# Patient Record
Sex: Female | Born: 1980 | Race: White | Hispanic: No | Marital: Married | Smoking: Former smoker
Health system: Southern US, Community
[De-identification: ages and names within clinical notes are randomized; demographics above are authoritative.]

## PROBLEM LIST (undated history)

## (undated) ENCOUNTER — Inpatient Hospital Stay (HOSPITAL_COMMUNITY): Payer: Self-pay

## (undated) ENCOUNTER — Emergency Department (HOSPITAL_COMMUNITY): Admission: EM | Payer: 59 | Source: Home / Self Care

## (undated) DIAGNOSIS — F1921 Other psychoactive substance dependence, in remission: Secondary | ICD-10-CM

## (undated) DIAGNOSIS — I10 Essential (primary) hypertension: Secondary | ICD-10-CM

## (undated) DIAGNOSIS — J9819 Other pulmonary collapse: Secondary | ICD-10-CM

## (undated) DIAGNOSIS — B1920 Unspecified viral hepatitis C without hepatic coma: Secondary | ICD-10-CM

## (undated) DIAGNOSIS — J189 Pneumonia, unspecified organism: Secondary | ICD-10-CM

## (undated) HISTORY — PX: ASPIRATION BIOPSY: SHX1197

## (undated) HISTORY — PX: WISDOM TOOTH EXTRACTION: SHX21

## (undated) HISTORY — DX: Essential (primary) hypertension: I10

---
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
## 2002-04-02 DIAGNOSIS — J9819 Other pulmonary collapse: Secondary | ICD-10-CM

## 2002-04-02 HISTORY — DX: Other pulmonary collapse: J98.19

## 2002-08-20 ENCOUNTER — Encounter: Payer: Self-pay | Admitting: Emergency Medicine

## 2002-08-20 ENCOUNTER — Emergency Department (HOSPITAL_COMMUNITY): Admission: EM | Admit: 2002-08-20 | Discharge: 2002-08-20 | Payer: Self-pay | Admitting: Emergency Medicine

## 2002-08-27 ENCOUNTER — Encounter: Payer: Self-pay | Admitting: Emergency Medicine

## 2002-08-27 ENCOUNTER — Inpatient Hospital Stay (HOSPITAL_COMMUNITY): Admission: EM | Admit: 2002-08-27 | Discharge: 2002-09-01 | Payer: Self-pay | Admitting: Emergency Medicine

## 2002-08-28 ENCOUNTER — Encounter: Payer: Self-pay | Admitting: Family Medicine

## 2002-09-01 ENCOUNTER — Encounter: Payer: Self-pay | Admitting: Family Medicine

## 2003-08-16 ENCOUNTER — Emergency Department (HOSPITAL_COMMUNITY): Admission: EM | Admit: 2003-08-16 | Discharge: 2003-08-16 | Payer: Self-pay | Admitting: Emergency Medicine

## 2004-03-27 ENCOUNTER — Emergency Department (HOSPITAL_COMMUNITY): Admission: EM | Admit: 2004-03-27 | Discharge: 2004-03-27 | Payer: Self-pay | Admitting: Emergency Medicine

## 2004-12-04 ENCOUNTER — Emergency Department (HOSPITAL_COMMUNITY): Admission: EM | Admit: 2004-12-04 | Discharge: 2004-12-04 | Payer: Self-pay | Admitting: Emergency Medicine

## 2005-01-31 ENCOUNTER — Ambulatory Visit (HOSPITAL_COMMUNITY): Admission: RE | Admit: 2005-01-31 | Discharge: 2005-01-31 | Payer: Self-pay | Admitting: *Deleted

## 2005-03-14 ENCOUNTER — Ambulatory Visit (HOSPITAL_COMMUNITY): Admission: RE | Admit: 2005-03-14 | Discharge: 2005-03-14 | Payer: Self-pay | Admitting: *Deleted

## 2005-04-07 ENCOUNTER — Inpatient Hospital Stay (HOSPITAL_COMMUNITY): Admission: AD | Admit: 2005-04-07 | Discharge: 2005-04-07 | Payer: Self-pay | Admitting: *Deleted

## 2005-05-25 ENCOUNTER — Inpatient Hospital Stay (HOSPITAL_COMMUNITY): Admission: AD | Admit: 2005-05-25 | Discharge: 2005-05-25 | Payer: Self-pay | Admitting: Obstetrics & Gynecology

## 2005-07-08 ENCOUNTER — Inpatient Hospital Stay (HOSPITAL_COMMUNITY): Admission: AD | Admit: 2005-07-08 | Discharge: 2005-07-08 | Payer: Self-pay | Admitting: Obstetrics

## 2005-08-07 ENCOUNTER — Inpatient Hospital Stay (HOSPITAL_COMMUNITY): Admission: AD | Admit: 2005-08-07 | Discharge: 2005-08-10 | Payer: Self-pay | Admitting: Obstetrics

## 2005-08-08 ENCOUNTER — Encounter (INDEPENDENT_AMBULATORY_CARE_PROVIDER_SITE_OTHER): Payer: Self-pay | Admitting: Specialist

## 2006-10-07 ENCOUNTER — Emergency Department (HOSPITAL_COMMUNITY): Admission: EM | Admit: 2006-10-07 | Discharge: 2006-10-07 | Payer: Self-pay | Admitting: Family Medicine

## 2007-01-30 ENCOUNTER — Emergency Department (HOSPITAL_COMMUNITY): Admission: EM | Admit: 2007-01-30 | Discharge: 2007-01-30 | Payer: Self-pay | Admitting: Family Medicine

## 2007-04-03 HISTORY — PX: LIVER BIOPSY: SHX301

## 2009-01-12 ENCOUNTER — Ambulatory Visit (HOSPITAL_COMMUNITY): Payer: Self-pay | Admitting: Psychiatry

## 2009-01-21 ENCOUNTER — Ambulatory Visit (HOSPITAL_COMMUNITY): Payer: Self-pay | Admitting: Psychology

## 2009-02-18 ENCOUNTER — Ambulatory Visit (HOSPITAL_COMMUNITY): Payer: Self-pay | Admitting: Psychology

## 2010-08-18 NOTE — Discharge Summary (Signed)
Krystal Zamora, Krystal Zamora                            ACCOUNT NO.:  [id number]   MEDICAL RECORD NO.:  [id number]                   PATIENT TYPE:  INP   LOCATION:  0455                                 FACILITY:  [facility]   PHYSICIAN:  Betti D. Pecola Leisure, M.D.                DATE OF BIRTH:  [birth year]   DATE OF ADMISSION:  08/27/2002  DATE OF DISCHARGE:  09/01/2002                                 DISCHARGE SUMMARY   CHIEF COMPLAINT:  Chest pain and cough.   HISTORY OF PRESENT ILLNESS:  The patient is a 30 year old Caucasian female  who is new to me.  She has a one day history of post-tussive chest pain,  shortness of breath and reportedly a history of hematemesis.  She is a  patient who was admitted in March and discharged in May for a left lung  empyema which was drained by chest tube placement.  Prior to her discharge  from the hospital, the chest tube was removed and the patient was felt to be  improving.  However, over the past 1-2 weeks she had begun coughing a mucus-  like, blood tinged sputum but thought that it was related to her allergies.  She had denied any nausea or fever.  The patient does have a history of drug  abuse and alcohol use.   LABORATORY DATA:  On admission, white count 15.7, hemoglobin 11, hematocrit  32.1, platelet count 428, neutrophil count 84% which was elevated.  Sodium  141, potassium 3.5, glucose 106, creatinine 0.4, BUN 10.  Calcium 9.5 with  albumin 3.2 which was low.   Chest x-ray revealed a left lower lobe pneumonia with some indication of a  consolidation in the right upper lobe.   HOSPITAL COURSE:  The patient was admitted for treatment of her right lower  lobe pneumonia and dually for observation to see if there would be any  further empyema formation considering her history.   She was placed on Zithromax 500 mg IV daily to manage her left lower lobe  pneumonia and Phenergan 25 mg IV q.4 h. to prevent nausea that she would  experience along with Ativan  0.5 mg one tablet q.8 h. as needed.   The patient did make progress in her hospitalization in that her discomfort  eased.  Repeat x-ray showed continued left lower lobe pneumonia but she had  not worsened.   On hospital day #5, she was discharged home with further treatment with  Cipro 500 mg one tablet for another ten days.  She was given Tylox 1-2  tablets q.4-6 h. for her pain and maintained on her Celexa 10 mg daily for  her depression.  In addition, she was given Augmentin XR 1000 mg one tablet  b.i.d. for a total of ten days therapy along with her Cipro.  She is to  follow with myself or her physician at [facility] in  the next 5-10 days.                                               Betti D. Pecola Leisure, M.D.    BDR/MEDQ  D:  09/09/2002  T:  09/09/2002  Job:  [id number]

## 2010-12-13 ENCOUNTER — Emergency Department (HOSPITAL_COMMUNITY): Payer: Self-pay

## 2010-12-13 ENCOUNTER — Emergency Department (HOSPITAL_COMMUNITY)
Admission: EM | Admit: 2010-12-13 | Discharge: 2010-12-13 | Disposition: A | Discharge status: Home / Self Care | Payer: Self-pay | Attending: Emergency Medicine | Admitting: Emergency Medicine

## 2010-12-13 DIAGNOSIS — R000 Tachycardia, unspecified: Secondary | ICD-10-CM | POA: Insufficient documentation

## 2010-12-13 DIAGNOSIS — J40 Bronchitis, not specified as acute or chronic: Secondary | ICD-10-CM | POA: Insufficient documentation

## 2010-12-13 DIAGNOSIS — J3489 Other specified disorders of nose and nasal sinuses: Secondary | ICD-10-CM | POA: Insufficient documentation

## 2010-12-13 DIAGNOSIS — R05 Cough: Secondary | ICD-10-CM | POA: Insufficient documentation

## 2010-12-13 DIAGNOSIS — IMO0001 Reserved for inherently not codable concepts without codable children: Secondary | ICD-10-CM | POA: Insufficient documentation

## 2010-12-13 DIAGNOSIS — R059 Cough, unspecified: Secondary | ICD-10-CM | POA: Insufficient documentation

## 2010-12-13 DIAGNOSIS — R079 Chest pain, unspecified: Secondary | ICD-10-CM | POA: Insufficient documentation

## 2010-12-13 DIAGNOSIS — R062 Wheezing: Secondary | ICD-10-CM | POA: Insufficient documentation

## 2010-12-13 LAB — POCT I-STAT, CHEM 8
BUN: 7 mg/dL (ref 6–23)
Calcium, Ion: 1.3 mmol/L (ref 1.12–1.32)
Chloride: 106 mEq/L (ref 96–112)
Creatinine, Ser: 0.8 mg/dL (ref 0.50–1.10)
Glucose, Bld: 75 mg/dL (ref 70–99)
HCT: 36 % (ref 36.0–46.0)
Hemoglobin: 12.2 g/dL (ref 12.0–15.0)
Potassium: 3.8 mEq/L (ref 3.5–5.1)
Sodium: 141 mEq/L (ref 135–145)
TCO2: 24 mmol/L (ref 0–100)

## 2010-12-13 LAB — D-DIMER, QUANTITATIVE: D-Dimer, Quant: 0.69 ug/mL-FEU — ABNORMAL HIGH (ref 0.00–0.48)

## 2010-12-13 MED ORDER — IOHEXOL 300 MG/ML  SOLN
100.0000 mL | Freq: Once | INTRAMUSCULAR | Status: AC | PRN
  Administered 2010-12-13: 100 mL via INTRAVENOUS

## 2011-01-10 LAB — POCT PREGNANCY, URINE
Operator id: 200941
Preg Test, Ur: NEGATIVE

## 2011-01-10 LAB — POCT URINALYSIS DIP (DEVICE)
Operator id: 200941
Specific Gravity, Urine: 1.015
Urobilinogen, UA: 0.2
pH: 6

## 2011-04-27 ENCOUNTER — Ambulatory Visit: Payer: Self-pay | Admitting: Family Medicine

## 2011-05-16 ENCOUNTER — Ambulatory Visit (INDEPENDENT_AMBULATORY_CARE_PROVIDER_SITE_OTHER): Payer: Self-pay | Admitting: Family Medicine

## 2011-05-16 VITALS — BP 124/90 | HR 110 | Temp 98.6°F | Ht 67.0 in | Wt 164.5 lb

## 2011-05-16 DIAGNOSIS — B1920 Unspecified viral hepatitis C without hepatic coma: Secondary | ICD-10-CM

## 2011-05-16 DIAGNOSIS — R454 Irritability and anger: Secondary | ICD-10-CM

## 2011-05-16 NOTE — Progress Notes (Signed)
  Subjective:    Patient ID: Krystal Zamora, female    DOB: 05-Oct-1980, 31 y.o.   MRN: [id number]  HPI Pt that comes today to establish care. 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 She also has diagnosis of Hepatitis C since that time. Her liver enzymes were within normal limits but has not had medical f/u in years. Denies symptoms of fatigue, nausea, anorexia or myalgias.  Her other complaint is irritability. She was taking SSRI in the past but does not remember the name and did not take it for a long period of time. She does not have anhedonia, or decrease level of energy, no suicidal/homicydal thoughts or plans. 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  No other complaints.  Review of Systems  Constitutional: Negative for fever, chills, activity change, appetite change and fatigue.  HENT: Negative.   Respiratory: Negative.   Cardiovascular: Negative for chest pain and palpitations.  Gastrointestinal: Negative for nausea, vomiting, abdominal pain, diarrhea, constipation and abdominal distention.  Genitourinary: Negative.   Musculoskeletal: Negative.   Neurological: Negative.  Negative for dizziness and weakness.  Psychiatric/Behavioral: Negative for suicidal ideas, hallucinations, behavioral problems, confusion, self-injury, dysphoric mood, decreased concentration and agitation. The patient is not nervous/anxious.        Objective:   Physical Exam  Constitutional: She is oriented to person, place, and time. She appears well-developed and well-nourished. No distress.  HENT:  Head: Normocephalic and atraumatic.  Mouth/Throat: No oropharyngeal exudate.  Eyes: Conjunctivae and EOM are normal. Pupils are equal, round, and reactive to light.  Cardiovascular: Normal rate, regular rhythm, normal heart sounds and intact distal pulses.   Pulmonary/Chest: Breath sounds normal. No respiratory distress.  Abdominal: Bowel  sounds are normal. She exhibits no distension and no mass. There is no tenderness. There is no rebound and no guarding.  Musculoskeletal: She exhibits no edema.  Neurological: She is alert and oriented to person, place, and time. She has normal reflexes.  Psychiatric: She has a normal mood and affect. Her behavior is normal. Judgment and thought content normal.          Assessment & Plan:

## 2011-05-16 NOTE — Patient Instructions (Addendum)
It has been a pleasure to meet you today. I will call you with the labs results if they come back abnormal otherwise you will receive a letter. Make an appointment for regular annual exam.

## 2011-05-21 DIAGNOSIS — R454 Irritability and anger: Secondary | ICD-10-CM | POA: Insufficient documentation

## 2011-05-21 NOTE — Assessment & Plan Note (Signed)
Diagnosed in 2004. Primary source IV drug abuse. Plan: CMET and HCV RNA. Also will check for HIV and Hep B since way of transmission is similar. Evaluate with results if pt will benefit from antiviral therapy.

## 2011-05-21 NOTE — Assessment & Plan Note (Signed)
PHQ-9 scored 3. No depression, mood disorder questionnaire also negative scrren for mood disturbances. Most likely due to emotional and social stress. This is not interfering with her normal daily functioning.  Plan: No pharmacologic intervention needed at this time. Will re-evaluate on next visit.

## 2011-07-18 ENCOUNTER — Other Ambulatory Visit: Payer: Self-pay

## 2011-07-18 ENCOUNTER — Ambulatory Visit (INDEPENDENT_AMBULATORY_CARE_PROVIDER_SITE_OTHER): Payer: Self-pay | Admitting: Family Medicine

## 2011-07-18 ENCOUNTER — Other Ambulatory Visit (HOSPITAL_COMMUNITY)
Admission: RE | Admit: 2011-07-18 | Discharge: 2011-07-18 | Disposition: A | Discharge status: Home / Self Care | Payer: Self-pay | Source: Ambulatory Visit | Attending: Family Medicine | Admitting: Family Medicine

## 2011-07-18 ENCOUNTER — Encounter: Payer: Self-pay | Admitting: Family Medicine

## 2011-07-18 VITALS — BP 123/87 | HR 71 | Temp 98.2°F | Ht 67.0 in | Wt 165.0 lb

## 2011-07-18 DIAGNOSIS — Z209 Contact with and (suspected) exposure to unspecified communicable disease: Secondary | ICD-10-CM

## 2011-07-18 DIAGNOSIS — Z113 Encounter for screening for infections with a predominantly sexual mode of transmission: Secondary | ICD-10-CM | POA: Insufficient documentation

## 2011-07-18 DIAGNOSIS — Z01419 Encounter for gynecological examination (general) (routine) without abnormal findings: Secondary | ICD-10-CM | POA: Insufficient documentation

## 2011-07-18 DIAGNOSIS — Z23 Encounter for immunization: Secondary | ICD-10-CM

## 2011-07-18 DIAGNOSIS — N760 Acute vaginitis: Secondary | ICD-10-CM

## 2011-07-18 DIAGNOSIS — B1920 Unspecified viral hepatitis C without hepatic coma: Secondary | ICD-10-CM

## 2011-07-18 DIAGNOSIS — Z124 Encounter for screening for malignant neoplasm of cervix: Secondary | ICD-10-CM

## 2011-07-18 LAB — POCT WET PREP (WET MOUNT)

## 2011-07-18 NOTE — Patient Instructions (Signed)
I will call you with results if anything is positive. You will get a letter if all normal. Make sure to schedule yearly physical exam.   Health Maintenance, Females A healthy lifestyle and preventative care can promote health and wellness.  Maintain regular health, dental, and eye exams.   Eat a healthy diet. Foods like vegetables, fruits, whole grains, low-fat dairy products, and lean protein foods contain the nutrients you need without too many calories. Decrease your intake of foods high in solid fats, added sugars, and salt. Get information about a proper diet from your caregiver, if necessary.   Regular physical exercise is one of the most important things you can do for your health. Most adults should get at least 150 minutes of moderate-intensity exercise (any activity that increases your heart rate and causes you to sweat) each week. In addition, most adults need muscle-strengthening exercises on 2 or more days a week.    Maintain a healthy weight. The body mass index (BMI) is a screening tool to identify possible weight problems. It provides an estimate of body fat based on height and weight. Your caregiver can help determine your BMI, and can help you achieve or maintain a healthy weight. For adults 20 years and older:   A BMI below 18.5 is considered underweight.   A BMI of 18.5 to 24.9 is normal.   A BMI of 25 to 29.9 is considered overweight.   A BMI of 30 and above is considered obese.   Maintain normal blood lipids and cholesterol by exercising and minimizing your intake of saturated fat. Eat a balanced diet with plenty of fruits and vegetables. Blood tests for lipids and cholesterol should begin at age 56 and be repeated every 5 years. If your lipid or cholesterol levels are high, you are over 50, or you are a high risk for heart disease, you may need your cholesterol levels checked more frequently.Ongoing high lipid and cholesterol levels should be treated with medicines if  diet and exercise are not effective.   If you smoke, find out from your caregiver how to quit. If you do not use tobacco, do not start.   If you are pregnant, do not drink alcohol. If you are breastfeeding, be very cautious about drinking alcohol. If you are not pregnant and choose to drink alcohol, do not exceed 1 drink per day. One drink is considered to be 12 ounces (355 mL) of beer, 5 ounces (148 mL) of wine, or 1.5 ounces (44 mL) of liquor.   Avoid use of street drugs. Do not share needles with anyone. Ask for help if you need support or instructions about stopping the use of drugs.   High blood pressure causes heart disease and increases the risk of stroke. Blood pressure should be checked at least every 1 to 2 years. Ongoing high blood pressure should be treated with medicines, if weight loss and exercise are not effective.   If you are 83 to 33 years old, ask your caregiver if you should take aspirin to prevent strokes.   Diabetes screening involves taking a blood sample to check your fasting blood sugar level. This should be done once every 3 years, after age 55, if you are within normal weight and without risk factors for diabetes. Testing should be considered at a younger age or be carried out more frequently if you are overweight and have at least 1 risk factor for diabetes.   Breast cancer screening is essential preventative care for women.  You should practice "breast self-awareness." This means understanding the normal appearance and feel of your breasts and may include breast self-examination. Any changes detected, no matter how small, should be reported to a caregiver. Women in their 44s and 30s should have a clinical breast exam (CBE) by a caregiver as part of a regular health exam every 1 to 3 years. After age 12, women should have a CBE every year. Starting at age 41, women should consider having a mammogram (breast X-ray) every year. Women who have a family history of breast cancer  should talk to their caregiver about genetic screening. Women at a high risk of breast cancer should talk to their caregiver about having an MRI and a mammogram every year.   The Pap test is a screening test for cervical cancer. Women should have a Pap test starting at age 24. Between ages 8 and 52, Pap tests should be repeated every 2 years. Beginning at age 55, you should have a Pap test every 3 years as long as the past 3 Pap tests have been normal. If you had a hysterectomy for a problem that was not cancer or a condition that could lead to cancer, then you no longer need Pap tests. If you are between ages 25 and 68, and you have had normal Pap tests going back 10 years, you no longer need Pap tests. If you have had past treatment for cervical cancer or a condition that could lead to cancer, you need Pap tests and screening for cancer for at least 20 years after your treatment. If Pap tests have been discontinued, risk factors (such as a new sexual partner) need to be reassessed to determine if screening should be resumed. Some women have medical problems that increase the chance of getting cervical cancer. In these cases, your caregiver may recommend more frequent screening and Pap tests.   The human papillomavirus (HPV) test is an additional test that may be used for cervical cancer screening. The HPV test looks for the virus that can cause the cell changes on the cervix. The cells collected during the Pap test can be tested for HPV. The HPV test could be used to screen women aged 34 years and older, and should be used in women of any age who have unclear Pap test results. After the age of 74, women should have HPV testing at the same frequency as a Pap test.   Colorectal cancer can be detected and often prevented. Most routine colorectal cancer screening begins at the age of 63 and continues through age 54. However, your caregiver may recommend screening at an earlier age if you have risk factors for  colon cancer. On a yearly basis, your caregiver may provide home test kits to check for hidden blood in the stool. Use of a small camera at the end of a tube, to directly examine the colon (sigmoidoscopy or colonoscopy), can detect the earliest forms of colorectal cancer. Talk to your caregiver about this at age 94, when routine screening begins. Direct examination of the colon should be repeated every 5 to 10 years through age 35, unless early forms of pre-cancerous polyps or small growths are found.   Hepatitis C blood testing is recommended for all people born from [birth year] through 1967 and any individual with known risks for hepatitis C.   Practice safe sex. Use condoms and avoid high-risk sexual practices to reduce the spread of sexually transmitted infections (STIs). Sexually active women aged 81 and younger should  be checked for Chlamydia, which is a common sexually transmitted infection. Older women with new or multiple partners should also be tested for Chlamydia. Testing for other STIs is recommended if you are sexually active and at increased risk.   Osteoporosis is a disease in which the bones lose minerals and strength with aging. This can result in serious bone fractures. The risk of osteoporosis can be identified using a bone density scan. Women ages 29 and over and women at risk for fractures or osteoporosis should discuss screening with their caregivers. Ask your caregiver whether you should be taking a calcium supplement or vitamin D to reduce the rate of osteoporosis.   Menopause can be associated with physical symptoms and risks. Hormone replacement therapy is available to decrease symptoms and risks. You should talk to your caregiver about whether hormone replacement therapy is right for you.   Use sunscreen with a sun protection factor (SPF) of 30 or greater. Apply sunscreen liberally and repeatedly throughout the day. You should seek shade when your shadow is shorter than you. Protect  yourself by wearing long sleeves, pants, a wide-brimmed hat, and sunglasses year round, whenever you are outdoors.   Notify your caregiver of new moles or changes in moles, especially if there is a change in shape or color. Also notify your caregiver if a mole is larger than the size of a pencil eraser.   Stay current with your immunizations.  Document Released: 10/02/2010 Document Revised: 03/08/2011 Document Reviewed: 10/02/2010 [facility] Patient Information 2012 [place], [place].

## 2011-07-18 NOTE — Assessment & Plan Note (Signed)
Check HIV, RPR, wet prep, GC/CH. Pap performed given history of abnormal result.

## 2011-07-18 NOTE — Progress Notes (Signed)
Labs done today Krystal Zamora 

## 2011-07-18 NOTE — Assessment & Plan Note (Signed)
Will check viral load today. Patient may require new hepatology/GI referral pending results.

## 2011-07-18 NOTE — Progress Notes (Signed)
  Subjective:    Patient ID: Krystal Zamora, female    DOB: 28-Dec-1980, 31 y.o.   MRN: [id number]  HPI  1. STD check. Patient recently discovered her boyfriend of 2 years had infidelity, exposed to possible pathogens through unprotected intercourse. Denies any discharge, pelvic pain, irregular bleeding, lesions, fevers. She uses nuvaring for contraception. Has a history of one abnormal pap few years ago and had one normal test after this.   2. Hx of hepatitis C. Contracted virus in 2004 through history of remote IV drug use. She states that she was treated with one month of interferon therapy several years later given by a GI specialist in [place]-cannot recall name currently.   Review of Systems SEe HPI otherwise negative.     Objective:   Physical Exam  Vitals reviewed. Constitutional: She is oriented to person, place, and time. She appears well-developed and well-nourished. No distress.  HENT:  Head: Normocephalic and atraumatic.  Mouth/Throat: Oropharynx is clear and moist.  Pulmonary/Chest: Effort normal.  Abdominal: Soft. She exhibits no distension. There is no tenderness.  Genitourinary: Vagina normal and uterus normal. No vaginal discharge found.       No lesions. No cervical motion tenderness. Uterine fundus not enlarged.  Musculoskeletal: She exhibits no edema.  Neurological: She is alert and oriented to person, place, and time.  Skin: No rash noted.  Psychiatric: She has a normal mood and affect.       Assessment & Plan:

## 2011-07-19 LAB — COMPREHENSIVE METABOLIC PANEL
ALT: 12 U/L (ref 0–35)
AST: 19 U/L (ref 0–37)
Albumin: 3.5 g/dL (ref 3.5–5.2)
Alkaline Phosphatase: 40 U/L (ref 39–117)
BUN: 12 mg/dL (ref 6–23)
CO2: 25 mEq/L (ref 19–32)
Calcium: 9.1 mg/dL (ref 8.4–10.5)
Chloride: 106 mEq/L (ref 96–112)
Creat: 0.72 mg/dL (ref 0.50–1.10)
Glucose, Bld: 100 mg/dL — ABNORMAL HIGH (ref 70–99)
Potassium: 3.7 mEq/L (ref 3.5–5.3)
Sodium: 138 mEq/L (ref 135–145)
Total Bilirubin: 0.4 mg/dL (ref 0.3–1.2)
Total Protein: 6.4 g/dL (ref 6.0–8.3)

## 2011-07-19 LAB — HEPATITIS B SURFACE ANTIGEN: Hepatitis B Surface Ag: NEGATIVE

## 2011-07-19 LAB — HIV ANTIBODY (ROUTINE TESTING W REFLEX): HIV: NONREACTIVE

## 2011-07-19 LAB — RPR

## 2011-07-20 LAB — HEPATITIS C RNA QUANTITATIVE
HCV Quantitative Log: 5.14 {Log} — ABNORMAL HIGH (ref <1.63)
HCV Quantitative: 138317 IU/mL — ABNORMAL HIGH (ref <43)

## 2011-07-23 ENCOUNTER — Telehealth: Payer: Self-pay | Admitting: Family Medicine

## 2011-07-23 DIAGNOSIS — B1920 Unspecified viral hepatitis C without hepatic coma: Secondary | ICD-10-CM

## 2011-07-23 NOTE — Telephone Encounter (Signed)
Called patient to discuss results. No STDs detected, but her HCV viral load is elevated now almost 10 years after initial infection. She has known HCV since 2004 and underwent one month of interferon under care of a GI specialist in [place].She is unsure of what her viral load was prior to treatment.  She currently has [employer] insurance and will need a GI referral for care as she has not been seen by her doctor in years.

## 2011-07-23 NOTE — Telephone Encounter (Signed)
Lupita Leash is working on the referral

## 2011-11-07 ENCOUNTER — Encounter: Payer: Self-pay | Admitting: Family Medicine

## 2011-11-07 ENCOUNTER — Ambulatory Visit (INDEPENDENT_AMBULATORY_CARE_PROVIDER_SITE_OTHER): Payer: Self-pay | Admitting: Family Medicine

## 2011-11-07 ENCOUNTER — Ambulatory Visit: Payer: Self-pay | Admitting: Family Medicine

## 2011-11-07 VITALS — BP 118/79 | HR 89 | Temp 98.3°F | Ht 67.0 in | Wt 165.0 lb

## 2011-11-07 DIAGNOSIS — Z331 Pregnant state, incidental: Secondary | ICD-10-CM

## 2011-11-07 DIAGNOSIS — Z3490 Encounter for supervision of normal pregnancy, unspecified, unspecified trimester: Secondary | ICD-10-CM | POA: Insufficient documentation

## 2011-11-07 DIAGNOSIS — N912 Amenorrhea, unspecified: Secondary | ICD-10-CM

## 2011-11-07 LAB — POCT URINE PREGNANCY: Preg Test, Ur: POSITIVE

## 2011-11-07 MED ORDER — PRENATAL MULTIVITAMIN CH: 1.0000 | ORAL_TABLET | Freq: Every day | ORAL | Status: DC

## 2011-11-07 NOTE — Progress Notes (Signed)
  Subjective:    Patient ID: Krystal Zamora, female    DOB: Feb 17, 1981, 31 y.o.   MRN: [id number]  HPI Patient is a [id number] who presents for pregnancy evaluation following positive pregnancy test.  Patient states she felt nauseated over the weekend and took a pregnancy test that was positive.  She states her last normal period was during the second week of June, couldn't remember the exact date.  Since then she had an episode of light spotting lasting one day that occurred around the time her period should have happened in July.  Her previous method of birth control was Nuvaring.  States she took this out when she broke up with her boyfriend.  States she has been sexually active since taking the nuvaring out.  Her concerns today are with regards to alcohol use during pregnancy.  She states she drank heavily on 3 occassions prior to finding out she was pregnant.  Patient also asked about exercise during pregnancy given that she had a 90 lb weight gain with previous pregnancy.  She plans to keep the baby.    Review of Systems  Gastrointestinal:       Nausea, vomiting x1  All other systems reviewed and are negative.       Objective:   Physical Exam  Constitutional: She is oriented to person, place, and time. She appears well-developed and well-nourished.  HENT:  Head: Normocephalic and atraumatic.  Cardiovascular: Normal rate, regular rhythm and normal heart sounds.   Pulmonary/Chest: Effort normal and breath sounds normal.  Neurological: She is alert and oriented to person, place, and time.  Psychiatric: She has a normal mood and affect.     Results for orders placed in visit on 11/07/11 (from the past 24 hour(s))  POCT URINE PREGNANCY     Status: Normal   Collection Time   11/07/11  1:52 PM      Component Value Range   Preg Test, Ur Positive          Assessment & Plan:

## 2011-11-07 NOTE — Assessment & Plan Note (Addendum)
Positive pregnancy test. Patient advised to abstain from alcohol.  Advised that there is no good evidence with regards to if her baby will be effected by her alcohol intake. Patient verbalized understanding of this. Given ABC's of pregnancy handout and advised to exercise at a light to moderate rate during pregnancy given previous pregnancy weight gain. She is to apply for pregnancy medicaid and will be scheduled for first prenatal visit and labs once this is pending. To start PNV.

## 2011-11-07 NOTE — Patient Instructions (Signed)
Nice to meet you today.  Your pregnancy test was positive today. We would like to see you back for your first prenatal visit.  Someone will set this appointment up for you. Please apply for pregnancy medicaid.  Once this is pending you will be able to get your prenatal labs drawn. Please refrain from alcohol use during pregnancy. I will prescribe prenatal vitamins.  You will be able to get these [facility] with [facility] card. Thank you for your visit.  ABCs of Pregnancy A Antepartum care is very important. Be sure you see your doctor and get prenatal care as soon as you think you are pregnant. At this time, you will be tested for infection, genetic abnormalities and potential problems with you and the pregnancy. This is the time to discuss diet, exercise, work, medications, labor, pain medication during labor and the possibility of a cesarean delivery. Ask any questions that may concern you. It is important to see your doctor regularly throughout your pregnancy. Avoid exposure to toxic substances and chemicals - such as cleaning solvents, lead and mercury, some insecticides, and paint. Pregnant women should avoid exposure to paint fumes, and fumes that cause you to feel ill, dizzy or faint. When possible, it is a good idea to have a pre-pregnancy consultation with your caregiver to begin some important recommendations your caregiver suggests such as, taking folic acid, exercising, quitting smoking, avoiding alcoholic beverages, etc. B Breastfeeding is the healthiest choice for both you and your baby. It has many nutritional benefits for the baby and health benefits for the mother. It also creates a very tight and loving bond between the baby and mother. Talk to your doctor, your family and friends, and your employer about how you choose to feed your baby and how they can support you in your decision. Not all birth defects can be prevented, but a woman can take actions that may increase  her chance of having a healthy baby. Many birth defects happen very early in pregnancy, sometimes before a woman even knows she is pregnant. Birth defects or abnormalities of any child in your or the father's family should be discussed with your caregiver. Get a good support bra as your breast size changes. Wear it especially when you exercise and when nursing.  C Celebrate the news of your pregnancy with the your spouse/father and family. Childbirth classes are helpful to take for you and the spouse/father because it helps to understand what happens during the pregnancy, labor and delivery. Cesarean delivery should be discussed with your doctor so you are prepared for that possibility. The pros and cons of circumcision if it is a boy, should be discussed with your pediatrician. Cigarette smoking during pregnancy can result in low birth weight babies. It has been associated with infertility, miscarriages, tubal pregnancies, infant death (mortality) and poor health (morbidity) in childhood. Additionally, cigarette smoking may cause long-term learning disabilities. If you smoke, you should try to quit before getting pregnant and not smoke during the pregnancy. Secondary smoke may also harm a mother and her developing baby. It is a good idea to ask people to stop smoking around you during your pregnancy and after the baby is born. Extra calcium is necessary when you are pregnant and is found in your prenatal vitamin, in dairy products, green leafy vegetables and in calcium supplements. D A healthy diet according to your current weight and height, along with vitamins and mineral supplements should be discussed with your caregiver. Domestic abuse  or violence should be made known to your doctor right away to get the situation corrected. Drink more water when you exercise to keep hydrated. Discomfort of your back and legs usually develops and progresses from the middle of the second trimester through to delivery of the  baby. This is because of the enlarging baby and uterus, which may also affect your balance. Do not take illegal drugs. Illegal drugs can seriously harm the baby and you. Drink extra fluids (water is best) throughout pregnancy to help your body keep up with the increases in your blood volume. Drink at least 6 to 8 glasses of water, fruit juice, or milk each day. A good way to know you are drinking enough fluid is when your urine looks almost like clear water or is very light yellow.  E Eat healthy to get the nutrients you and your unborn baby need. Your meals should include the five basic food groups. Exercise (30 minutes of light to moderate exercise a day) is important and encouraged during pregnancy, if there are no medical problems or problems with the pregnancy. Exercise that causes discomfort or dizziness should be stopped and reported to your caregiver. Emotions during pregnancy can change from being ecstatic to depression and should be understood by you, your partner and your family. F Fetal screening with ultrasound, amniocentesis and monitoring during pregnancy and labor is common and sometimes necessary. Take 400 micrograms of folic acid daily both before, when possible, and during the first few months of pregnancy to reduce the risk of birth defects of the brain and spine. All women who could possibly become pregnant should take a vitamin with folic acid, every day. It is also important to eat a healthy diet with fortified foods (enriched grain products, including cereals, rice, breads, and pastas) and foods with natural sources of folate (orange juice, green leafy vegetables, beans, peanuts, broccoli, asparagus, peas, and lentils). The father should be involved with all aspects of the pregnancy including, the prenatal care, childbirth classes, labor, delivery, and postpartum time. Fathers may also have emotional concerns about being a father, financial needs, and raising a family. G Genetic testing  should be done appropriately. It is important to know your family and the father's history. If there have been problems with pregnancies or birth defects in your family, report these to your doctor. Also, genetic counselors can talk with you about the information you might need in making decisions about having a family. You can call a major medical center in your area for help in finding a board-certified genetic counselor. Genetic testing and counseling should be done before pregnancy when possible, especially if there is a history of problems in the mother's or father's family. Certain ethnic backgrounds are more at risk for genetic defects. H Get familiar with the hospital where you will be having your baby. Get to know how long it takes to get there, the labor and delivery area, and the hospital procedures. Be sure your medical insurance is accepted there. Get your home ready for the baby including, clothes, the baby's room (when possible), furniture and car seat. Hand washing is important throughout the day, especially after handling raw meat and poultry, changing the baby's diaper or using the bathroom. This can help prevent the spread of many bacteria and viruses that cause infection. Your hair may become dry and thinner, but will return to normal a few weeks after the baby is born. Heartburn is a common problem that can be treated by taking  antacids recommended by your caregiver, eating smaller meals 5 or 6 times a day, not drinking liquids when eating, drinking between meals and raising the head of your bed 2 to 3 inches. I Insurance to cover you, the baby, doctor and hospital should be reviewed so that you will be prepared to pay any costs not covered by your insurance plan. If you do not have medical insurance, there are usually clinics and services available for you in your community. Take 30 milligrams of iron during your pregnancy as prescribed by your doctor to reduce the risk of low red blood  cells (anemia) later in pregnancy. All women of childbearing age should eat a diet rich in iron. J There should be a joint effort for the mother, father and any other children to adapt to the pregnancy financially, emotionally, and psychologically during the pregnancy. Join a support group for moms-to-be. Or, join a class on parenting or childbirth. Have the family participate when possible. K Know your limits. Let your caregiver know if you experience any of the following:   Pain of any kind.   Strong cramps.   You develop a lot of weight in a short period of time (5 pounds in 3 to 5 days).   Vaginal bleeding, leaking of amniotic fluid.   Headache, vision problems.   Dizziness, fainting, shortness of breath.   Chest pain.   Fever of 102 F (38.9 C) or higher.   Gush of clear fluid from your vagina.   Painful urination.   Domestic violence.   Irregular heartbeat (palpitations).   Rapid beating of the heart (tachycardia).   Constant feeling sick to your stomach (nauseous) and vomiting.   Trouble walking, fluid retention (edema).   Muscle weakness.   If your baby has decreased activity.   Persistent diarrhea.   Abnormal vaginal discharge.   Uterine contractions at 20-minute intervals.   Back pain that travels down your leg.  L Learn and practice that what you eat and drink should be in moderation and healthy for you and your baby. Legal drugs such as alcohol and caffeine are important issues for pregnant women. There is no safe amount of alcohol a woman can drink while pregnant. Fetal alcohol syndrome, a disorder characterized by growth retardation, facial abnormalities, and central nervous system dysfunction, is caused by a woman's use of alcohol during pregnancy. Caffeine, found in tea, coffee, soft drinks and chocolate, should also be limited. Be sure to read labels when trying to cut down on caffeine during pregnancy. More than 200 foods, beverages, and  over-the-counter medications contain caffeine and have a high salt content! There are coffees and teas that do not contain caffeine. M Medical conditions such as diabetes, epilepsy, and high blood pressure should be treated and kept under control before pregnancy when possible, but especially during pregnancy. Ask your caregiver about any medications that may need to be changed or adjusted during pregnancy. If you are currently taking any medications, ask your caregiver if it is safe to take them while you are pregnant or before getting pregnant when possible. Also, be sure to discuss any herbs or vitamins you are taking. They are medicines, too! Discuss with your doctor all medications, prescribed and over-the-counter, that you are taking. During your prenatal visit, discuss the medications your doctor may give you during labor and delivery. N Never be afraid to ask your doctor or caregiver questions about your health, the progress of the pregnancy, family problems, stressful situations, and recommendation for  a pediatrician, if you do not have one. It is better to take all precautions and discuss any questions or concerns you may have during your office visits. It is a good idea to write down your questions before you visit the doctor. O Over-the-counter cough and cold remedies may contain alcohol or other ingredients that should be avoided during pregnancy. Ask your caregiver about prescription, herbs or over-the-counter medications that you are taking or may consider taking while pregnant.  P Physical activity during pregnancy can benefit both you and your baby by lessening discomfort and fatigue, providing a sense of well-being, and increasing the likelihood of early recovery after delivery. Light to moderate exercise during pregnancy strengthens the belly (abdominal) and back muscles. This helps improve posture. Practicing yoga, walking, swimming, and cycling on a stationary bicycle are usually safe  exercises for pregnant women. Avoid scuba diving, exercise at high altitudes (over 3000 feet), skiing, horseback riding, contact sports, etc. Always check with your doctor before beginning any kind of exercise, especially during pregnancy and especially if you did not exercise before getting pregnant. Q Queasiness, stomach upset and morning sickness are common during pregnancy. Eating a couple of crackers or dry toast before getting out of bed. Foods that you normally love may make you feel sick to your stomach. You may need to substitute other nutritious foods. Eating 5 or 6 small meals a day instead of 3 large ones may make you feel better. Do not drink with your meals, drink between meals. Questions that you have should be written down and asked during your prenatal visits. R Read about and make plans to baby-proof your home. There are important tips for making your home a safer environment for your baby. Review the tips and make your home safer for you and your baby. Read food labels regarding calories, salt and fat content in the food. S Saunas, hot tubs, and steam rooms should be avoided while you are pregnant. Excessive high heat may be harmful during your pregnancy. Your caregiver will screen and examine you for sexually transmitted diseases and genetic disorders during your prenatal visits. Learn the signs of labor. Sexual relations while pregnant is safe unless there is a medical or pregnancy problem and your caregiver advises against it. T Traveling long distances should be avoided especially in the third trimester of your pregnancy. If you do have to travel out of state, be sure to take a copy of your medical records and medical insurance plan with you. You should not travel long distances without seeing your doctor first. Most airlines will not allow you to travel after 36 weeks of pregnancy. Toxoplasmosis is an infection caused by a parasite that can seriously harm an unborn baby. Avoid eating  undercooked meat and handling cat litter. Be sure to wear gloves when gardening. Tingling of the hands and fingers is not unusual and is due to fluid retention. This will go away after the baby is born. U Womb (uterus) size increases during the first trimester. Your kidneys will begin to function more efficiently. This may cause you to feel the need to urinate more often. You may also leak urine when sneezing, coughing or laughing. This is due to the growing uterus pressing against your bladder, which lies directly in front of and slightly under the uterus during the first few months of pregnancy. If you experience burning along with frequency of urination or bloody urine, be sure to tell your doctor. The size of your uterus in the  third trimester may cause a problem with your balance. It is advisable to maintain good posture and avoid wearing high heels during this time. An ultrasound of your baby may be necessary during your pregnancy and is safe for you and your baby. V Vaccinations are an important concern for pregnant women. Get needed vaccines before pregnancy. Center for Disease Control ([employer]) has clear guidelines for the use of vaccines during pregnancy. Review the list, be sure to discuss it with your doctor. Prenatal vitamins are helpful and healthy for you and the baby. Do not take extra vitamins except what is recommended. Taking too much of certain vitamins can cause overdose problems. Continuous vomiting should be reported to your caregiver. Varicose veins may appear especially if there is a family history of varicose veins. They should subside after the delivery of the baby. Support hose helps if there is leg discomfort. W Being overweight or underweight during pregnancy may cause problems. Try to get within 15 pounds of your ideal weight before pregnancy. Remember, pregnancy is not a time to be dieting! Do not stop eating or start skipping meals as your weight increases. Both you and  your baby need the calories and nutrition you receive from a healthy diet. Be sure to consult with your doctor about your diet. There is a formula and diet plan available depending on whether you are overweight or underweight. Your caregiver or nutritionist can help and advise you if necessary. X Avoid X-rays. If you must have dental work or diagnostic tests, tell your dentist or physician that you are pregnant so that extra care can be taken. X-rays should only be taken when the risks of not taking them outweigh the risk of taking them. If needed, only the minimum amount of radiation should be used. When X-rays are necessary, protective lead shields should be used to cover areas of the body that are not being X-rayed. Y Your baby loves you. Breastfeeding your baby creates a loving and very close bond between the two of you. Give your baby a healthy environment to live in while you are pregnant. Infants and children require constant care and guidance. Their health and safety should be carefully watched at all times. After the baby is born, rest or take a nap when the baby is sleeping. Z Get your ZZZs. Be sure to get plenty of rest. Resting on your side as often as possible, especially on your left side is advised. It provides the best circulation to your baby and helps reduce swelling. Try taking a nap for 30 to 45 minutes in the afternoon when possible. After the baby is born rest or take a nap when the baby is sleeping. Try elevating your feet for that amount of time when possible. It helps the circulation in your legs and helps reduce swelling.  Most information courtesy of the CDC. Document Released: 03/19/2005 Document Revised: 03/08/2011 Document Reviewed: 12/01/2008 [facility] Patient Information 2012 [place], [place].

## 2011-11-20 ENCOUNTER — Other Ambulatory Visit: Payer: Medicaid Other

## 2011-11-20 DIAGNOSIS — Z3490 Encounter for supervision of normal pregnancy, unspecified, unspecified trimester: Secondary | ICD-10-CM

## 2011-11-20 NOTE — Progress Notes (Signed)
PRENATAL LABS DONE TODAY Krystal Zamora 

## 2011-11-21 LAB — OBSTETRIC PANEL
Antibody Screen: NEGATIVE
Basophils Absolute: 0 10*3/uL (ref 0.0–0.1)
Basophils Relative: 0 % (ref 0–1)
Eosinophils Absolute: 0.3 10*3/uL (ref 0.0–0.7)
Eosinophils Relative: 3 % (ref 0–5)
HCT: 36.8 % (ref 36.0–46.0)
Hemoglobin: 13 g/dL (ref 12.0–15.0)
Hepatitis B Surface Ag: NEGATIVE
Lymphocytes Relative: 36 % (ref 12–46)
Lymphs Abs: 3.6 10*3/uL (ref 0.7–4.0)
MCH: 31.9 pg (ref 26.0–34.0)
MCHC: 35.3 g/dL (ref 30.0–36.0)
MCV: 90.2 fL (ref 78.0–100.0)
Monocytes Absolute: 1 10*3/uL (ref 0.1–1.0)
Monocytes Relative: 10 % (ref 3–12)
Neutro Abs: 5.2 10*3/uL (ref 1.7–7.7)
Neutrophils Relative %: 51 % (ref 43–77)
Platelets: 263 10*3/uL (ref 150–400)
RBC: 4.08 MIL/uL (ref 3.87–5.11)
RDW: 12.7 % (ref 11.5–15.5)
Rh Type: POSITIVE
Rubella: 27.1 IU/mL — ABNORMAL HIGH
WBC: 10.2 10*3/uL (ref 4.0–10.5)

## 2011-11-21 LAB — HIV ANTIBODY (ROUTINE TESTING W REFLEX): HIV: NONREACTIVE

## 2011-11-21 LAB — SICKLE CELL SCREEN: Sickle Cell Screen: NEGATIVE

## 2011-11-22 LAB — CULTURE, OB URINE
Colony Count: NO GROWTH
Organism ID, Bacteria: NO GROWTH

## 2011-11-27 ENCOUNTER — Ambulatory Visit (HOSPITAL_COMMUNITY)
Admission: RE | Admit: 2011-11-27 | Discharge: 2011-11-27 | Disposition: A | Discharge status: Home / Self Care | Payer: Medicaid Other | Source: Ambulatory Visit | Attending: Family Medicine | Admitting: Family Medicine

## 2011-11-27 ENCOUNTER — Ambulatory Visit (INDEPENDENT_AMBULATORY_CARE_PROVIDER_SITE_OTHER): Payer: Medicaid Other | Admitting: Family Medicine

## 2011-11-27 ENCOUNTER — Encounter: Payer: Self-pay | Admitting: Family Medicine

## 2011-11-27 VITALS — BP 129/84 | Temp 98.3°F | Wt 174.0 lb

## 2011-11-27 DIAGNOSIS — Z3480 Encounter for supervision of other normal pregnancy, unspecified trimester: Secondary | ICD-10-CM

## 2011-11-27 DIAGNOSIS — O3680X0 Pregnancy with inconclusive fetal viability, not applicable or unspecified: Secondary | ICD-10-CM

## 2011-11-27 DIAGNOSIS — Z3490 Encounter for supervision of normal pregnancy, unspecified, unspecified trimester: Secondary | ICD-10-CM

## 2011-11-27 DIAGNOSIS — O209 Hemorrhage in early pregnancy, unspecified: Secondary | ICD-10-CM | POA: Insufficient documentation

## 2011-11-27 NOTE — Progress Notes (Signed)
Patient states that she has a family history of twins. She has had some spotting.

## 2011-11-27 NOTE — Progress Notes (Signed)
Pt comes today for her first OB visit.  S: She complains of vaginal bleeding that has been happening on and off for the pass 4 weeks. She also comes with picture on her phone of a clot that was present when she wiped last weekend. She denies anything else coming down from her vagina or abdominal pain. She is also doubtful abut dating of her pregnancy since she was using nuva ring and her menses were not regular. LMP was on second week of July but was scant. Normal appearance LMP was in June. No exact date. Pt is concerned of fetal viability. She declines vaginal exam today.  O: Physical exam Vitals reviewed and WNL. Gen:  NAD HEENT: Moist mucous membranes  Neck: normal thyroid size. No masses. No adenopathies. CV: Regular rate and rhythm, no murmurs rubs or gallops PULM: Clear to auscultation bilaterally. ABD: Soft, non tender, non distended, normal bowel sounds Neuro: Alert and oriented x3. No focalization   A/P Pt has Chronic Hep C: Last HCV 07/18/2011 quantitative was 138317 and quant log 5.14. Only treated 1 month with interferon in the remote past. 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 But drank heavily three times before she found out she was pregnant. She states has not taken anymore alcohol beverages since preg test was positive. Recent (07/18/11) STD evaluation with negative results for referred unprotected sexual activity with promiscuous boyfriend. PAP SMEAR 07/18/2011 negative. Initial OB labs WNL. -U/S per viability and dating. -pt interested in genetic testing. Will order integrated screening. -next visit varicella titer and 1h GTT. -[facility] consult for evaluation Hep C. -not indicated antiviral treatment. -continue taking prenatal tablets and avoidance of alcohol/drugs during pregnancy.

## 2011-11-27 NOTE — Patient Instructions (Addendum)
It has been a pleasure to see you today. Please continue taking your prenatal tablets daily. U/S has been coordinated. You will be inform of results. A high risk ob consult will be scheduled for your Hep C and pregnancy evaluation.

## 2011-11-29 ENCOUNTER — Encounter: Payer: Self-pay | Admitting: Family Medicine

## 2011-11-29 ENCOUNTER — Ambulatory Visit (INDEPENDENT_AMBULATORY_CARE_PROVIDER_SITE_OTHER): Payer: No Typology Code available for payment source | Admitting: Gastroenterology

## 2011-11-29 ENCOUNTER — Other Ambulatory Visit: Payer: Self-pay | Admitting: Family Medicine

## 2011-11-29 DIAGNOSIS — B1920 Unspecified viral hepatitis C without hepatic coma: Secondary | ICD-10-CM

## 2011-11-29 DIAGNOSIS — B182 Chronic viral hepatitis C: Secondary | ICD-10-CM

## 2011-11-29 LAB — PROTIME-INR
INR: 0.96 (ref <1.50)
Prothrombin Time: 13.2 seconds (ref 11.6–15.2)

## 2011-11-29 NOTE — Patient Instructions (Signed)
Our [facility] number is [phone]  Identify yourself as a [facility] patient To register as a [facility] patient, you would have to call [phone] option 3

## 2011-11-30 LAB — HEPATITIS B SURFACE ANTIBODY,QUALITATIVE: Hep B S Ab: POSITIVE — AB

## 2011-11-30 LAB — COMPLETE METABOLIC PANEL WITH GFR
ALT: 29 U/L (ref 0–35)
AST: 23 U/L (ref 0–37)
Albumin: 3.9 g/dL (ref 3.5–5.2)
Alkaline Phosphatase: 44 U/L (ref 39–117)
BUN: 13 mg/dL (ref 6–23)
CO2: 24 mEq/L (ref 19–32)
Calcium: 9.1 mg/dL (ref 8.4–10.5)
Chloride: 103 mEq/L (ref 96–112)
Creat: 0.59 mg/dL (ref 0.50–1.10)
GFR, Est African American: >89 mL/min
GFR, Est Non African American: >89 mL/min
Glucose, Bld: 88 mg/dL (ref 70–99)
Potassium: 3.8 mEq/L (ref 3.5–5.3)
Sodium: 135 mEq/L (ref 135–145)
Total Bilirubin: 0.3 mg/dL (ref 0.3–1.2)
Total Protein: 6.8 g/dL (ref 6.0–8.3)

## 2011-11-30 LAB — HEPATITIS B CORE ANTIBODY, TOTAL: Hep B Core Total Ab: POSITIVE — AB

## 2011-11-30 LAB — HEPATITIS A ANTIBODY, TOTAL: Hep A Total Ab: NEGATIVE

## 2011-12-05 LAB — HEPATITIS C GENOTYPE

## 2011-12-05 NOTE — Addendum Note (Signed)
Addended by: Deno Etienne on: 12/05/2011 01:44 PM   Modules accepted: Orders

## 2011-12-06 ENCOUNTER — Telehealth: Payer: Self-pay | Admitting: *Deleted

## 2011-12-06 NOTE — Telephone Encounter (Signed)
Called pt and informed her of the appt to have the Genetic screening done and she stated that she did NOT wish to have this done she stated that she has done some research on this and thru her reading she found out if there is some type of defect the only option is abortion. I told her that it would be a personal decision for her to make whether or not she would want to terminate or keep. She stated that she discussed this with her boyfriend and this is not an option for her. I asked if she was sure that she wanted to cancel this appt and she stated that she did.   I called and spoke with Olegario Messier @ [facility] [phone]) and informed her of what the pt told me and asked that she cancel her appt. I will forward this to pcp .Loralee Pacas [place]

## 2011-12-07 ENCOUNTER — Encounter: Payer: Medicaid Other | Admitting: Family Medicine

## 2011-12-13 ENCOUNTER — Encounter (HOSPITAL_COMMUNITY): Payer: Self-pay | Admitting: *Deleted

## 2011-12-13 ENCOUNTER — Inpatient Hospital Stay (HOSPITAL_COMMUNITY)
Admission: AD | Admit: 2011-12-13 | Discharge: 2011-12-13 | Disposition: A | Discharge status: Home / Self Care | Payer: Medicaid Other | Source: Ambulatory Visit | Attending: Obstetrics & Gynecology | Admitting: Obstetrics & Gynecology

## 2011-12-13 ENCOUNTER — Other Ambulatory Visit: Payer: Self-pay | Admitting: Advanced Practice Midwife

## 2011-12-13 ENCOUNTER — Inpatient Hospital Stay (HOSPITAL_COMMUNITY): Payer: Medicaid Other

## 2011-12-13 DIAGNOSIS — A499 Bacterial infection, unspecified: Secondary | ICD-10-CM | POA: Insufficient documentation

## 2011-12-13 DIAGNOSIS — O200 Threatened abortion: Secondary | ICD-10-CM | POA: Insufficient documentation

## 2011-12-13 DIAGNOSIS — O021 Missed abortion: Secondary | ICD-10-CM

## 2011-12-13 DIAGNOSIS — O2390 Unspecified genitourinary tract infection in pregnancy, unspecified trimester: Secondary | ICD-10-CM | POA: Insufficient documentation

## 2011-12-13 DIAGNOSIS — B9689 Other specified bacterial agents as the cause of diseases classified elsewhere: Secondary | ICD-10-CM | POA: Insufficient documentation

## 2011-12-13 DIAGNOSIS — N760 Acute vaginitis: Secondary | ICD-10-CM | POA: Insufficient documentation

## 2011-12-13 HISTORY — DX: Unspecified viral hepatitis C without hepatic coma: B19.20

## 2011-12-13 HISTORY — DX: Other psychoactive substance dependence, in remission: F19.21

## 2011-12-13 LAB — WET PREP, GENITAL
Trich, Wet Prep: NONE SEEN
Yeast Wet Prep HPF POC: NONE SEEN

## 2011-12-13 MED ORDER — METRONIDAZOLE 500 MG PO TABS: 500.0000 mg | ORAL_TABLET | Freq: Two times a day (BID) | ORAL | Status: AC

## 2011-12-13 MED ORDER — MISOPROSTOL 200 MCG PO TABS: ORAL_TABLET | ORAL | Status: DC

## 2011-12-13 NOTE — MAU Provider Note (Signed)
History     CSN: [id number]  Arrival date and time: 12/13/11 1359   None     Chief Complaint  Patient presents with  . Vaginal Bleeding  . Abdominal Pain   HPI Krystal Zamora is 31 y.o. [id number] [redacted]w[redacted]d weeks presenting with vaginal bleeding in first trimester pregnancy.  She is a patient of [facility] and has begun prenatal care.  She reports spotting over the last few weeks and last night had blood "pouring" out.  Mild cramping today.  Last intercourse was 5 days ago.  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   Graduating from [employer] in [place].  Planning graduate school.  + for Hep C.     Past Medical History  Diagnosis Date  . Hepatitis C virus 2004    s/p one month interferon treatment   . Hx of abnormal Pap smear 1997    s/p LEEP procedure.  . Hepatitis C   . Aspiration of vomit 2004  . 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 of herion    Past Surgical History  Procedure Date  . Aspiration biopsy     in ICU for aspiration of vomit for 7 weeks    Family History  Problem Relation Age of Onset  . Other Neg Hx     History  Substance Use Topics  . Smoking status: Former Smoker    Quit date: 12/12/2005  . Smokeless tobacco: Never Used  . Alcohol Use: No     no heroin use since 2007 after detox    Allergies:  Allergies  Allergen Reactions  . Sulfa Antibiotics     Prescriptions prior to admission  Medication Sig Dispense Refill  . Prenatal Vit-Fe Fumarate-FA (PRENATAL MULTIVITAMIN) TABS Take 1 tablet by mouth daily.  30 tablet  3    Review of Systems  Constitutional: Negative.   HENT: Negative.   Respiratory: Negative.   Cardiovascular: Negative.   Gastrointestinal: Positive for abdominal pain (mild cramping).  Genitourinary:       + vaginal bleeding   Physical Exam   Blood pressure 121/89, pulse 73, temperature 98.2 F (36.8 C), temperature source Oral, resp. rate 16, height 5\' 8"  (1.727 m),  weight 79.652 kg (175 lb 9.6 oz), last menstrual period 10/16/2011, SpO2 100.00%.  Physical Exam  Constitutional: She is oriented to person, place, and time. She appears well-developed and well-nourished. No distress.  HENT:  Head: Normocephalic.  Neck: Normal range of motion.  Cardiovascular: Normal rate.   Respiratory: Effort normal.  GI: Soft. There is no tenderness. There is no rebound and no guarding.  Genitourinary: Uterus is enlarged (measures approx 9 weeks ). Uterus is not tender. Right adnexum displays no mass, no tenderness and no fullness. Left adnexum displays no mass, no tenderness and no fullness. There is bleeding (small amount of stringy blood) around the vagina.  Neurological: She is alert and oriented to person, place, and time.  Skin: Skin is warm and dry.  Psychiatric: She has a normal mood and affect. Her behavior is normal.     From previous chart--BLOOD TYPE IS A Positive MAU Course  Procedures  MDM Discussed ultrasound findings with the patient.   Discussed options of expectant management, cytotec and D&E.  Patient's clear choice is to wait and miscarry on her own.  She wants to have follow up at [facility].   Assessment and Plan  A:  Inevitable spontaneous miscarriage  At [redacted]w[redacted]d without cardiac activity     Bacterial vaginosis  P:  Discussed expected course of miscarriage     Patient will call [facility] tomorrow am to schedule 2 week follow up    Return for heavy vaginal bleeding or severe pain.    Rx for Flagyl 500mg  bid X 1 week Merdis Snodgrass,EVE M 12/13/2011, 4:04 PM

## 2011-12-13 NOTE — Progress Notes (Signed)
NAME:  Krystal Zamora, FLOW  MR#:  [id number]      DATE:  11/29/2011  DOB:  15-Aug-1980    cc: Krystal Zamora Krystal Zamora, M.D.;   T Bing Plume, M.D., [facility], [address], [place], [place] [address], fax [phone]    primary care physician:  [phone] Krystal Zamora, M.D.   consulting physician: George Ina, M.D., [facility], [address], [place], [place] [address], fax [phone].  reason for referral:  Genotype 2 hepatitis C.   HISTORY:  The patient is a 31 year old woman whom I have been asked to see in consultation by Dr. Hillis Zamora regarding genotype 2 hepatitis C.   According to the patient, she was first diagnosed with hepatitis C on Krystal Zamora, [place], in 2004 when she presented to a local hospital with nausea, vomiting, and bilateral pneumothoraces. After a Krystal convalescence, she moved back to [place] and was seen by Dr. Aviva Zamora at [facility] on 11/25/2007. His notes indicate she was genotype 2b, although I did not see the actual lab results. He obtained a liver biopsy on 12/18/2007, which showed grade 1, stage 0 to 1 disease. The patient was then commenced on therapy sometime around late September/early October 2009. I do not have exact start date from the records I received, but on 01/24/2008, her viral load was undetectable. Perhaps this was week four of therapy. Because of psychiatric side effects, the patient recalls that she discontinued the Pegasys and ribavirin shortly after one month of therapy. There was no further follow up of her genotype 2b hepatitis C until more recently, when she presented to the [facility]. Liver enzymes were normal, but her viral load was detectable, so she was referred on to [place]. There were no symptoms to suggest cryoglobulin-mediated disease or decompensated liver disease.   With respect to risk factors for liver disease, she  would consume approximately one bottle of wine per week in one sitting prior to her pregnancy of eight weeks ago. There is no history of DWIs. She had a history of intravenous drug use in 2004, whereupon she stopped. She has a tattoo. She has not received any blood transfusions prior to 1992. She has no history of unsterile body piercing. There is no family history of liver disease. She believes she may have been vaccinated against hepatitis B, but is not certain.   PAST MEDICAL HISTORY:  Otherwise unremarkable. No diabetes, dyslipidemia, coronary artery disease, hypertension, dysthyroidism.   PAST SURGICAL HISTORY:  VATS with decortication of the left chest in 2004 after pneumothorax.   PAST PSYCHIATRIC HISTORY:  Approximately two years ago, she saw a therapist for irritability. She had two visits only and did not return because she reports that the irritability was attributed to normal stressors.   CURRENT MEDICATIONS:  Prenatal multivitamin.   ALLERGIES:  SULFA CAUSES HIVES.   HABITS:  Smoking: Quit over six years ago. Alcohol: As above.   FAMILY HISTORY:  As above.   SOCIAL HISTORY:  Single with a 22-year-old son. She works as a Water engineer and also is attending [employer] to obtain a psychology degree, which she will finish in 03/2012.   REVIEW OF SYSTEMS:  All 10 systems reviewed today on the review of systems form, which was signed and placed in the chart. Her CES-D was 3.   PHYSICAL EXAMINATION:  Well-appearing without stigmata  of chronic liver disease. Vital Zamora: Height 67 inches, weight 174 pounds, blood pressure 111/76, pulse of 80, temperature 97.1 degrees Fahrenheit. Constitutional: No significant peripheral wasting. Ears, Nose, Mouth and Throat: Unremarkable oropharynx. No thyromegaly or neck masses. Chest: Resonant to percussion. Clear to auscultation. Cardiovascular: Heart sounds normal S1, S2 without murmurs or rubs. There is no peripheral edema. Abdomen:  Normal bowel sounds. No masses or tenderness. I could not appreciate a liver edge or spleen tip. I could not appreciate any hernias. Lymphatics: No cervical or inguinal lymphadenopathy.  Central Nervous System: No asterixis or focal neurologic findings.  Dermatologic: Anicteric without palmar erythema or spider angiomata.  Eyes: Anicteric sclerae. Pupils are equal and reactive to light.  previous LABORATORY STUDIES:  On 12/16/2007, her serum protein electrophoresis was unremarkable. On 11/29/2007, her iron saturation was 56%. HIV viral load negative, hepatitis B surface antigen negative, hepatitis C antibody was positive. ANA was negative. The ceruloplasmin was normal. Alpha-1 antitrypsin level normal. F-actin (smooth muscle) antibody was 54 with the upper limits of normal being 20, considered positive. AST was less than 1.3.   Her more recent labs were reviewed within EPIC.   Her viral load on 07/18/2011, was 138,317 international units per milliliter or 5.14 log international units per milliliter. There has been no relevant imaging of the liver.   ASSESSMENT:  The patient is a 31 year old woman with history of genotype 2b hepatitis C virus with a biopsy on 12/18/2007, showing grade 1, stage 0 to 1 disease. She was given perhaps a month's worth of PEG-Interferon and ribavirin, which she was intolerant of.  However, the only viral load I received suggests that she may have a RVR on Pegasys and ribavirin.   She is now eight weeks pregnant. Ribavirin is contraindicated in the setting of pregnancy, so there is no treatment that can be given at this point. Furthermore, I would argue that I would not treat her until sofosbuvir is available. This is expected after 03/2012. For genotype 2 patients, they can be treated with a combination of sofosbuvir and ribavirin alone without interferon, which would address her concern of the psychiatric side effects of interferon that led her to stop treatment in the past.     We discussed the implications of hepatitis C for pregnancy. She states that she is to be sent to high risk OB, but I do not understand the reason for this as there is no indication of significant liver disease from her previous lab tests and liver biopsy. Furthermore, there is nothing that could be done to mitigate the risk, that is to say C-sections have no advantage over vaginal deliveries. Transmission through breast milk would be extremely rare, if not impossible, if not ever described. The incidence of transmission from mother to child is 5%, particularly true if HIV positive, which she is not.   In my discussion today with the patient, we discussed her previous genotype and its significance, as well as previous liver biopsy findings. We discussed treatment and the teratogenicity of this. We discussed waiting for the availability of sofosbuvir in combination of ribavirin after she delivers. We also discussed the risk of contagion.   PLAN:  1. Will test for hepatitis A and B immunity.  2. Will confirm the genotype, as it has been over four years.  3. Will test liver enzymes, as well.  4. Given her due date in 07/2012, I have asked her to contact our office after she delivers for an appointment. This will likely  be done at [facility] as this clinic is closing. She indicates that she actually may move to [place] to join her boyfriend, and coming to [facility] would be more convenient for her.  5. I have given her literature on hepatitis C.            Brooke Dare, MD   ADDENDUM Genotype 2b  A nave (Hep A Total Ab NEG)  B immune (Hep B S Ab POS)  [phone]  D:  Thu Aug 29 17:43:05 2013 ; T:  Fri Aug 30 09:20:42 2013  Job #:  [id number]

## 2011-12-13 NOTE — MAU Note (Signed)
Patient states she has had spotting for a couple of weeks, was heavy and "pouring out" last night. Today has been pinkish on the tissue with wiping. Has some abdominal cramping.

## 2011-12-13 NOTE — Progress Notes (Signed)
Pt seen in MAU today with 8.2 week missed AB, initially planned for expectant mgmt, but called back requesting cytotec. Rx sent to pharmacy, instructions and precautions rev'd. Pt plans f/u at [facility] where she is an established patient.

## 2011-12-13 NOTE — MAU Provider Note (Signed)
Attestation of Attending Supervision of Advanced Practitioner (CNM/NP): Evaluation and management procedures were performed by the Advanced Practitioner under my supervision and collaboration.  I have reviewed the Advanced Practitioner's note and chart, and I agree with the management and plan.  Eliza Green, MD, FACOG Attending Obstetrician & Gynecologist Faculty Practice, [facility]  

## 2011-12-18 ENCOUNTER — Telehealth: Payer: Self-pay | Admitting: Family Medicine

## 2011-12-18 NOTE — Telephone Encounter (Signed)
Needs to ask a question about a possible miscarriage

## 2011-12-18 NOTE — Telephone Encounter (Signed)
Message left to return call.

## 2011-12-18 NOTE — Telephone Encounter (Addendum)
Spoke with patient ans she states she went to [facility] last Thurday 09/12 and was told she has an inevitable miscarriage. Was told she  could  let it pass naturally, have a D&C or take 4 pills vaginally. She has chosen to take vaginal tabs but has not  taken yet . Calling to ask what to expect.   Consulted with Dr. Earnest Bailey and she advises to insert all tabs at one time.  In 2-6 hours expect heavy bleeding. If she has heavy bleeding requiring several change of pads in an hour for more than 2 hours let MD know. Can expect some  heavy bleeding for 6 hours.  Will have bleeding like a regular period for next few weeks. Expect some cramping . Advised to take 800 mg ibuprofen when she insets the vaginal tabs,.  If she experiences severe pain or fever to go to [facility] or here. . Patient voices understanding. She will try to have someone with her for 8 hours .  Has a young child.

## 2011-12-20 ENCOUNTER — Telehealth: Payer: Self-pay | Admitting: *Deleted

## 2011-12-20 NOTE — Telephone Encounter (Signed)
Patient was seen at [facility] recently and dx'd with miscarriage.  Patient was given tablets to take, but she decided not to take them.  Yesterday patient had cramping, passed a clot, and had some spotting afterwards.  Thirty minutes ago patient had abdominal cramping, passed a "4-inch clot" and had increased vaginal bleeding.  Has soaked 6 pads in 30 minutes.  Patient is out of town in [place] and wants to know if she should be concerned.  Patient denies any fever, shortness of breath, weakness, or fatigue.  Spoke with Dr. Earnest Bailey (preceptor).  Bleeding due to miscarriage and should start to decrease within 1-2 hours.  If patient experiences any symptoms of blood loss (shortness of breath, weakness, or fatigue) or if bleeding worsens or does not decrease, patient needs to go to ED to be evaluated.   Patient verbalized understanding.   Gaylene Brooks, RN

## 2011-12-25 ENCOUNTER — Ambulatory Visit (INDEPENDENT_AMBULATORY_CARE_PROVIDER_SITE_OTHER): Payer: Medicaid Other | Admitting: Family Medicine

## 2011-12-25 ENCOUNTER — Encounter: Payer: Self-pay | Admitting: Family Medicine

## 2011-12-25 VITALS — BP 121/73 | HR 84 | Temp 97.8°F | Ht 67.0 in | Wt 176.3 lb

## 2011-12-25 DIAGNOSIS — O034 Incomplete spontaneous abortion without complication: Secondary | ICD-10-CM

## 2011-12-25 DIAGNOSIS — O039 Complete or unspecified spontaneous abortion without complication: Secondary | ICD-10-CM

## 2011-12-25 MED ORDER — ETONOGESTREL-ETHINYL ESTRADIOL 0.12-0.015 MG/24HR VA RING: VAGINAL_RING | VAGINAL | Status: DC

## 2011-12-25 NOTE — Progress Notes (Signed)
  Subjective:    Patient ID: Krystal Zamora, female    DOB: 1980-05-05, 31 y.o.   MRN: [id number]  HPI  Patient is a [id number] presents to clinic for follow up inevitable abortion.  She was seen at [facility] 2 weeks ago and was told that she had a miscarriage at 8 weeks 2 days.  She opted to use Cytotec instead of D&C, however when she got home, she decided to not take the pills.  Last Thursday (5 days ago), patient passed bright red clots and blood for about 2 hours.  She also removed a dark red, mucous string that day.  Patient had intense pelvic cramping also.  Since then, bleeding decreased to less than a period.  Patient would like to know when she can resume intercourse, start contraception, and start exercising.  Patient denies any pelvic pain currently, fever, chills, NS, nausea or vomiting.     Review of Systems  Per HPI    Objective:   Physical Exam  Constitutional: No distress.  Genitourinary: Vagina normal and uterus normal. There is no rash, tenderness, lesion or injury on the right labia. There is no rash, tenderness, lesion or injury on the left labia. Cervix exhibits no motion tenderness, no discharge and no friability.       Cervix closed; scant brown discharge appreciated; no bright red blood or clots          Assessment & Plan:

## 2011-12-25 NOTE — Assessment & Plan Note (Signed)
Passed POC one week ago and symptoms are improving. Start contraception: Nuvaring sent to pharmacy Okay to increase physical activity as tolerated Okay to resume sex 2 weeks after evacuation Discussed red flags (signs of infection) with patient Follow up as needed or if symptoms worsen

## 2011-12-27 ENCOUNTER — Inpatient Hospital Stay: Payer: Medicaid Other | Admitting: Family Medicine

## 2011-12-28 ENCOUNTER — Other Ambulatory Visit (HOSPITAL_COMMUNITY): Payer: Medicaid Other

## 2011-12-28 ENCOUNTER — Ambulatory Visit (HOSPITAL_COMMUNITY): Payer: Medicaid Other

## 2012-01-01 ENCOUNTER — Encounter: Payer: Self-pay | Admitting: Family Medicine

## 2012-01-01 NOTE — Progress Notes (Signed)
Note reviewed.  Pt had a missed AB at 8 weeks.

## 2013-04-02 NOTE — L&D Delivery Note (Signed)
Delivery Note At 4:30 PM a viable female was delivered via Vaginal, Spontaneous Delivery (Presentation: ; Occiput Anterior).  APGAR: , ; weight .   Placenta status: Intact, Spontaneous.  Cord: 3 vessels with the following complications: None.  Cord pH: n/a  Anesthesia:   Episiotomy:  Lacerations:  Suture Repair: n/a Est. Blood Loss (mL):   Mom to postpartum.  Baby to Couplet care / Skin to Skin.  Wyvonnia Dusky [name] 12/13/2013, 4:52 PM

## 2013-04-21 IMAGING — CT CT ANGIO CHEST
2 of 6 series · 19 of 46 positions shown · IV contrast (APPLIED)
Comparison: Chest radiographs dated 12/13/2010.

CLINICAL DATA: Cough, chest pain, shortness of breath, history of
collapse lung, evaluate for PE.

CT ANGIOGRAPHY CHEST WITH CONTRAST
TECHNIQUE: Multidetector CT imaging of the chest was performed
using the standard protocol during bolus administration of
intravenous contrast.  Multiplanar CT image reconstructions
including MIPs were obtained to evaluate the vascular anatomy.
Contrast:  100 ml Zmnipaque-JTT IV

[Series 8: pulm embolism 1.0 b25f thin · axial · 0.70mm/px · z∈[+1182,+1460]mm · 16 of 305 slices shown]
[im 14/305  lung]
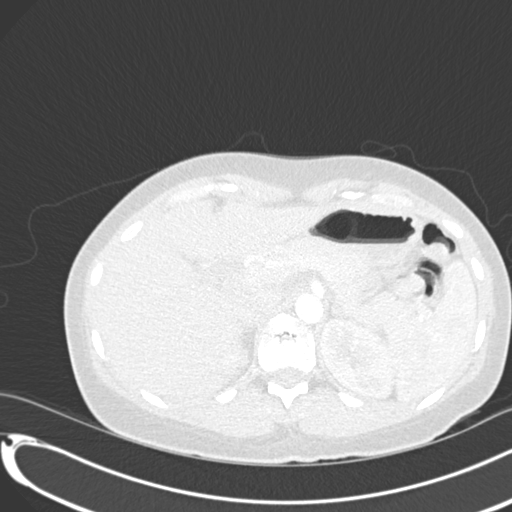
[im 40/305  soft-tissue]
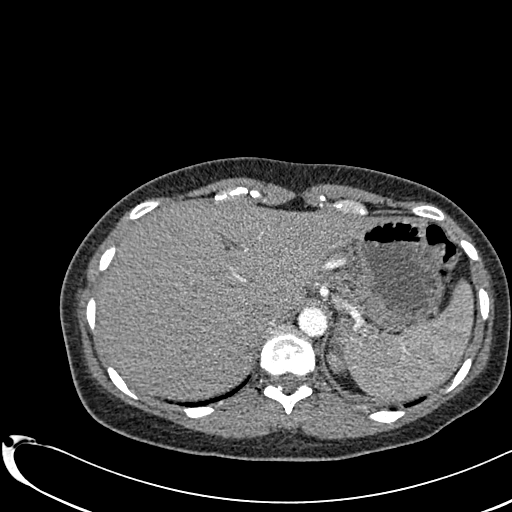
[im 53/305  lung]
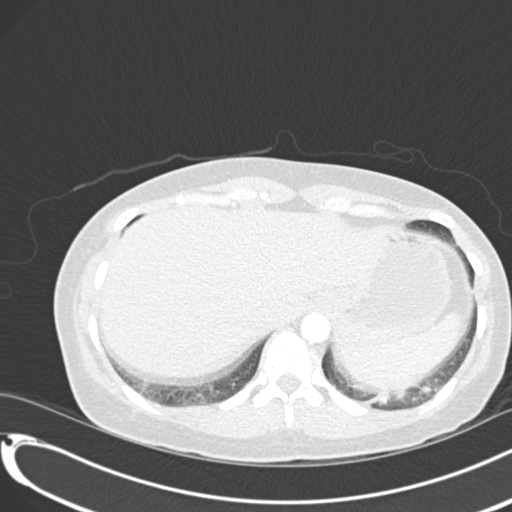
[im 67/305  soft-tissue]
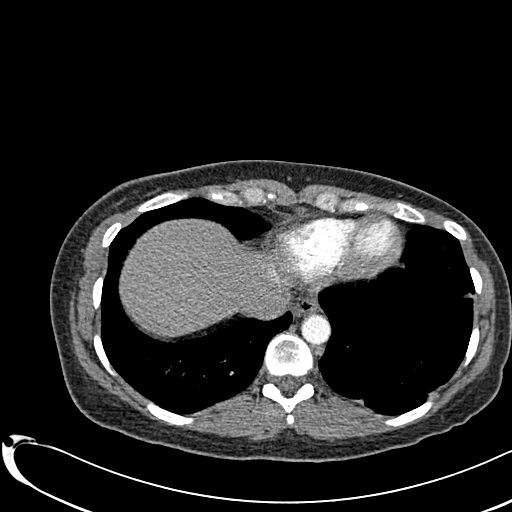
[im 93/305  lung]
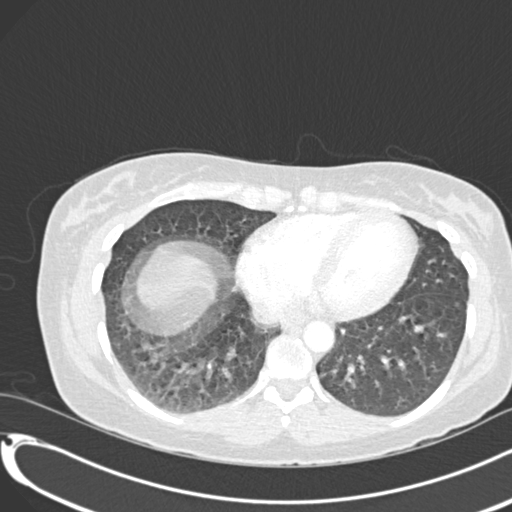
[im 106/305  soft-tissue]
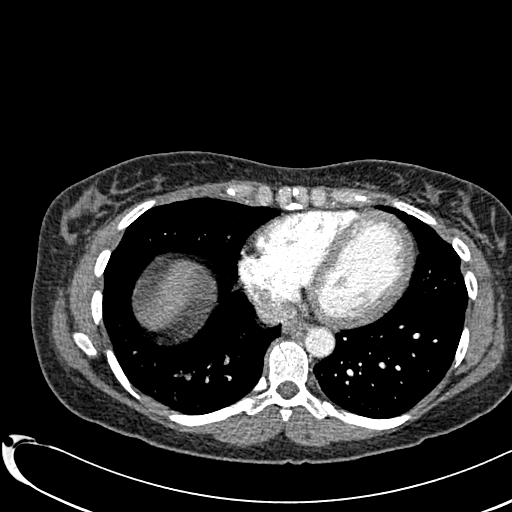
[im 119/305  lung]
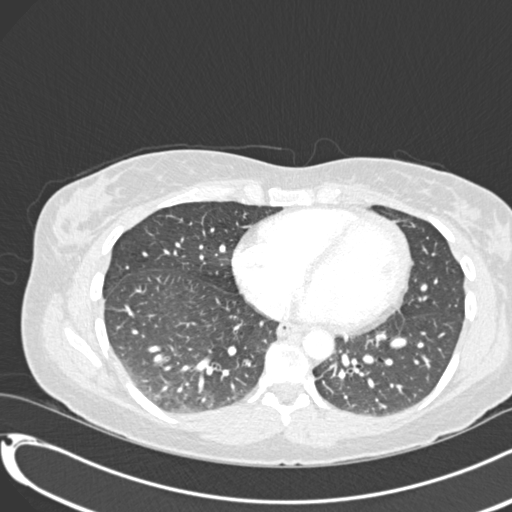
[im 146/305  soft-tissue]
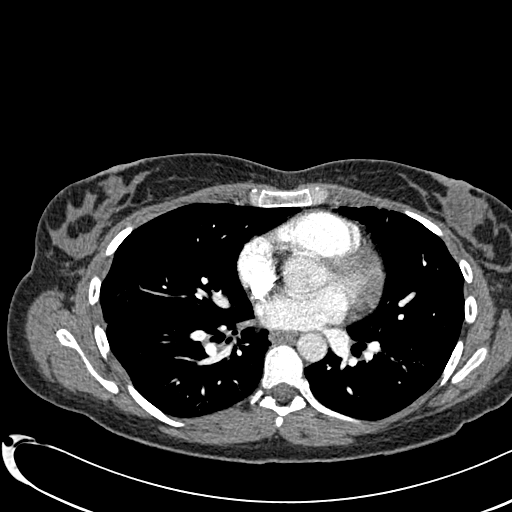
[im 159/305  lung]
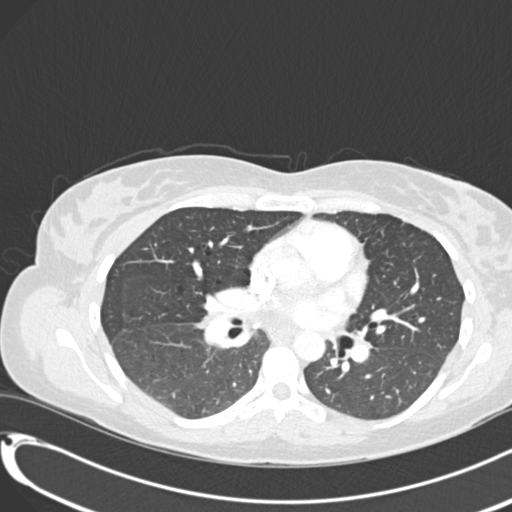
[im 186/305  soft-tissue]
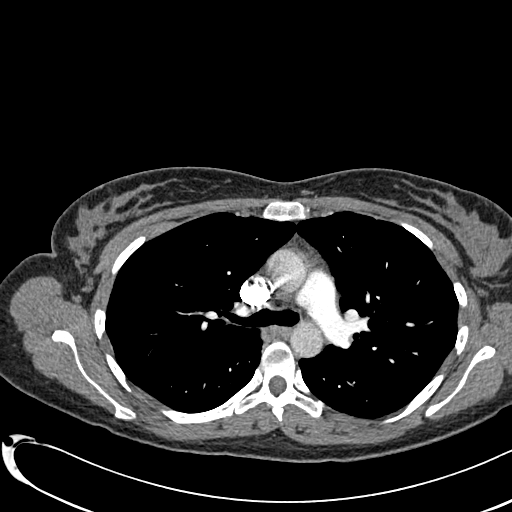
[im 199/305  lung]
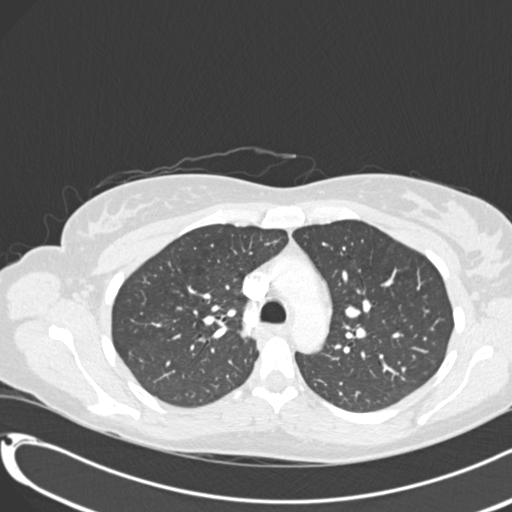
[im 212/305  soft-tissue]
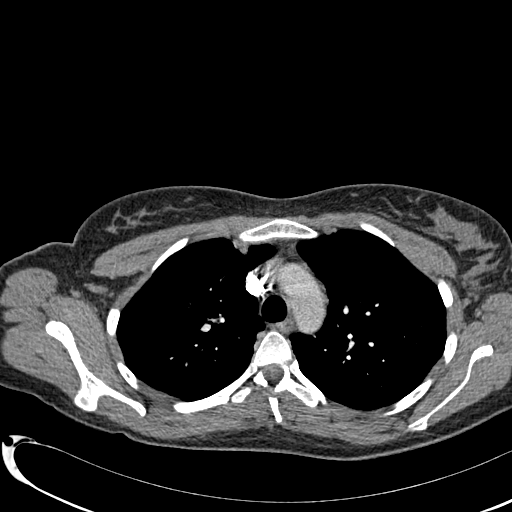
[im 238/305  lung]
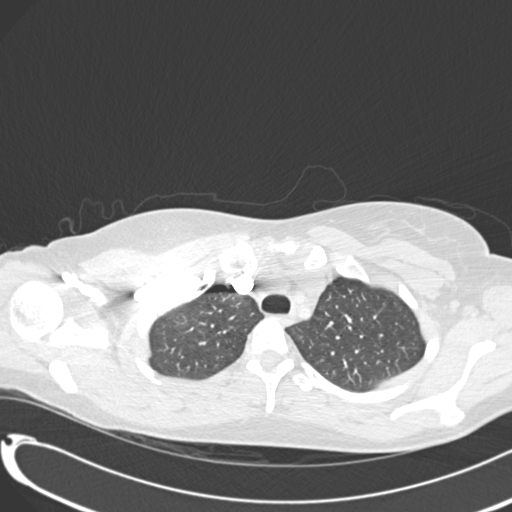
[im 252/305  soft-tissue]
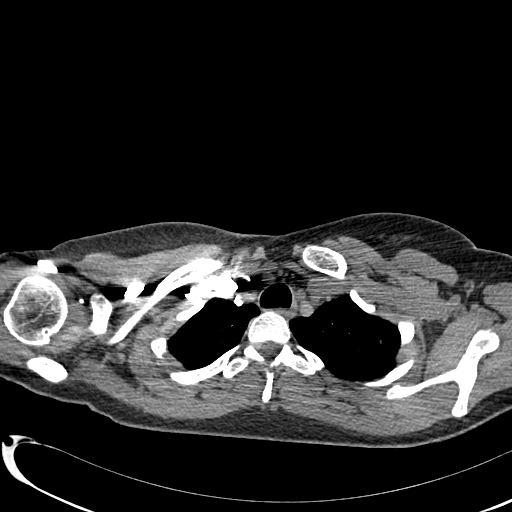
[im 265/305  lung]
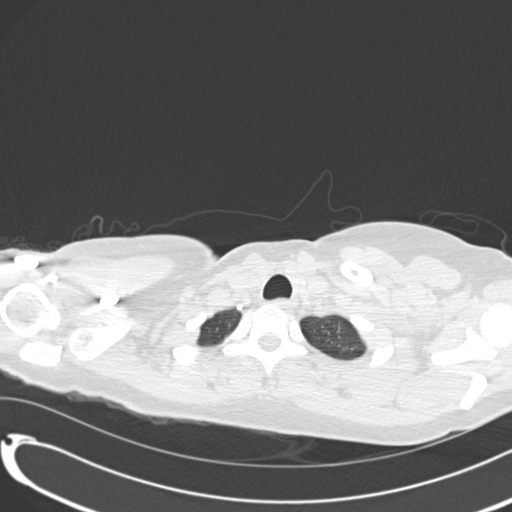
[im 291/305  soft-tissue]
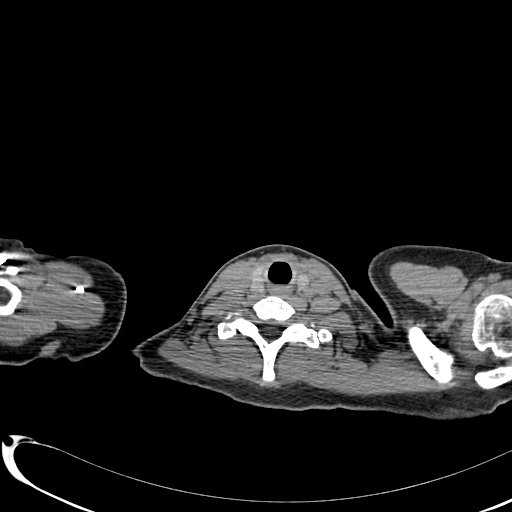

[Series 602: cor · coronal · 0.70mm/px · 3 of 94 slices shown]
[im 24/94  soft-tissue]
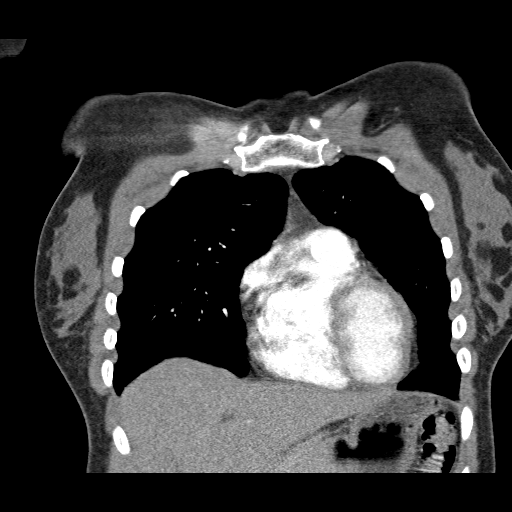
[im 47/94  soft-tissue]
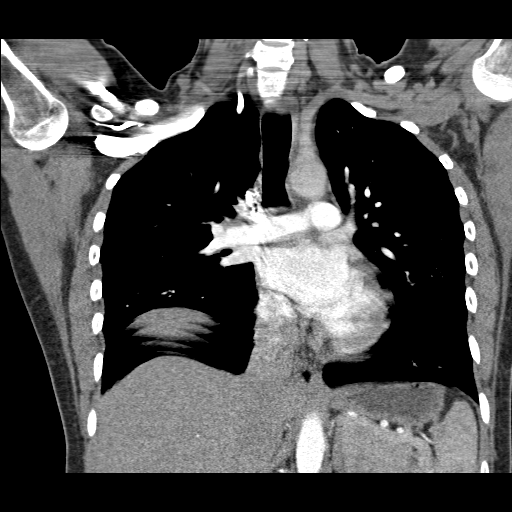
[im 70/94  soft-tissue]
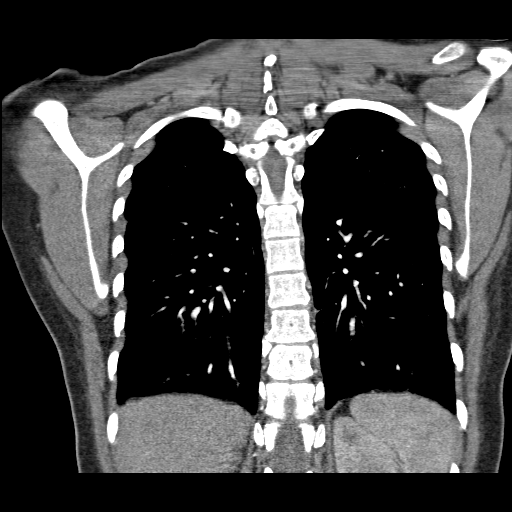

[19 of 46 positions shown; findings below may reference images not displayed]

FINDINGS: No evidence of pulmonary embolism.

Minimal right basilar atelectasis.  Left basilar scarring versus
atelectasis.  No suspicious pulmonary nodules.  No pleural effusion
or pneumothorax.

Visualized thyroid is unremarkable.

The heart is normal in size.  No pericardial effusion.

No suspicious mediastinal, hilar, or axillary lymphadenopathy.

Visualized upper abdomen is unremarkable.

Visualized osseous structures are within normal limits.

Review of the MIP images confirms the above findings.
IMPRESSION: No evidence of pulmonary embolism.

## 2013-05-11 ENCOUNTER — Ambulatory Visit (INDEPENDENT_AMBULATORY_CARE_PROVIDER_SITE_OTHER): Payer: Medicaid Other | Admitting: Family Medicine

## 2013-05-11 ENCOUNTER — Encounter: Payer: Self-pay | Admitting: Family Medicine

## 2013-05-11 VITALS — BP 116/77 | HR 84 | Ht 67.0 in | Wt 194.1 lb

## 2013-05-11 DIAGNOSIS — N912 Amenorrhea, unspecified: Secondary | ICD-10-CM

## 2013-05-11 DIAGNOSIS — Z3201 Encounter for pregnancy test, result positive: Secondary | ICD-10-CM | POA: Insufficient documentation

## 2013-05-11 LAB — POCT URINE PREGNANCY: Preg Test, Ur: POSITIVE

## 2013-05-11 NOTE — Assessment & Plan Note (Signed)
Pt with positive preg test, not planned but does want the child.  As well, she has history of Hep C and alcohol abuse.  Had one miscarriage at 9 wks last yr.  She does not smoke and denies any recent drug use.  Had 1-2 glasses of wine over the past month and half.  FDLMP 04/02/2013 and has not been taking prenatal vitamins.  Per GI note from 2013, "no indication of significant liver disease from her previous lab tests and liver biopsy. Furthermore, there is nothing that could be done to mitigate the risk, that is to say C-sections have no advantage over vaginal deliveries. Transmission through breast milk would be extremely rare, if not impossible, if not ever described. The incidence of transmission from mother to child is 5%, particularly true if HIV positive, which she is not."  Will arrange prenatal labs and initial ob visit along with starting pre-natal vitamins.

## 2013-05-11 NOTE — Progress Notes (Signed)
Krystal Zamora is a 33 y.o. female who presents today for amenorrhea.   Pt with positive preg test, not planned but does want the child.  As well, she has history of Hep C and alcohol abuse.  Had one miscarriage at 9 wks last yr.  She does not smoke and denies any recent drug use.  Had 1-2 glasses of wine over the past month and half.  FDLMP 04/02/2013 and has not been taking prenatal vitamins.   Past Medical History  Diagnosis Date  . Hepatitis C virus 2004    s/p one month interferon treatment   . Hx of abnormal Pap smear 1997    s/p LEEP procedure.  . Hepatitis C   . Aspiration of vomit 2004  . 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 of herion    History  Smoking status  . Former Smoker  . Quit date: 12/12/2005  Smokeless tobacco  . Never Used    Family History  Problem Relation Age of Onset  . Other Neg Hx     Current Outpatient Prescriptions on File Prior to Visit  Medication Sig Dispense Refill  . Prenatal Vit-Fe Fumarate-FA (PRENATAL MULTIVITAMIN) TABS Take 1 tablet by mouth daily.  30 tablet  3   No current facility-administered medications on file prior to visit.    ROS: Per HPI.  All other systems reviewed and are negative.   Physical Exam Filed Vitals:   05/11/13 1538  BP: 116/77  Pulse: 84    Physical Examination: General appearance - alert, well appearing, and in no distress Neck - supple, no significant adenopathy Heart - normal rate and regular rhythm Abdomen - soft, nontender, nondistended, no masses or organomegaly  Lab Results  Component Value Date   ALT 29 11/29/2011   AST 23 11/29/2011   ALKPHOS 44 11/29/2011   BILITOT 0.3 11/29/2011   Lab Results  Component Value Date   HAV NEG 11/29/2011   HEPBCAB POS* 11/29/2011

## 2013-05-11 NOTE — Patient Instructions (Signed)
Pregnancy - First Trimester  During sexual intercourse, millions of sperm go into the vagina. Only 1 sperm will penetrate and fertilize the female egg while it is in the Fallopian tube. One week later, the fertilized egg implants into the wall of the uterus. An embryo begins to develop into a baby. At 6 to 8 weeks, the eyes and face are formed and the heartbeat can be seen on ultrasound. At the end of 12 weeks (first trimester), all the baby's organs are formed. Now that you are pregnant, you will want to do everything you can to have a healthy baby. Two of the most important things are to get good prenatal care and follow your caregiver's instructions. Prenatal care is all the medical care you receive before the baby's birth. It is given to prevent, find, and treat problems during the pregnancy and childbirth.  PRENATAL EXAMS  · During prenatal visits, your weight, blood pressure, and urine are checked. This is done to make sure you are healthy and progressing normally during the pregnancy.  · A pregnant woman should gain 25 to 35 pounds during the pregnancy. However, if you are overweight or underweight, your caregiver will advise you regarding your weight.  · Your caregiver will ask and answer questions for you.  · Blood work, cervical cultures, other necessary tests, and a Pap test are done during your prenatal exams. These tests are done to check on your health and the probable health of your baby. Tests are strongly recommended and done for HIV with your permission. This is the virus that causes AIDS. These tests are done because medicines can be given to help prevent your baby from being born with this infection should you have been infected without knowing it. Blood work is also used to find out your blood type, previous infections, and follow your blood levels (hemoglobin).  · Low hemoglobin (anemia) is common during pregnancy. Iron and vitamins are given to help prevent this. Later in the pregnancy, blood  tests for diabetes will be done along with any other tests if any problems develop.  · You may need other tests to make sure you and the baby are doing well.  CHANGES DURING THE FIRST TRIMESTER   Your body goes through many changes during pregnancy. They vary from person to person. Talk to your caregiver about changes you notice and are concerned about. Changes can include:  · Your menstrual period stops.  · The egg and sperm carry the genes that determine what you look like. Genes from you and your partner are forming a baby. The female genes determine whether the baby is a boy or a girl.  · Your body increases in girth and you may feel bloated.  · Feeling sick to your stomach (nauseous) and throwing up (vomiting). If the vomiting is uncontrollable, call your caregiver.  · Your breasts will begin to enlarge and become tender.  · Your nipples may stick out more and become darker.  · The need to urinate more. Painful urination may mean you have a bladder infection.  · Tiring easily.  · Loss of appetite.  · Cravings for certain kinds of food.  · At first, you may gain or lose a couple of pounds.  · You may have changes in your emotions from day to day (excited to be pregnant or concerned something may go wrong with the pregnancy and baby).  · You may have more vivid and strange dreams.  HOME CARE INSTRUCTIONS   ·   It is very important to avoid all smoking, alcohol and non-prescribed drugs during your pregnancy. These affect the formation and growth of the baby. Avoid chemicals while pregnant to ensure the delivery of a healthy infant.  · Start your prenatal visits by the 12th week of pregnancy. They are usually scheduled monthly at first, then more often in the last 2 months before delivery. Keep your caregiver's appointments. Follow your caregiver's instructions regarding medicine use, blood and lab tests, exercise, and diet.  · During pregnancy, you are providing food for you and your baby. Eat regular, well-balanced  meals. Choose foods such as meat, fish, milk and other low fat dairy products, vegetables, fruits, and whole-grain breads and cereals. Your caregiver will tell you of the ideal weight gain.  · You can help morning sickness by keeping soda crackers at the bedside. Eat a couple before arising in the morning. You may want to use the crackers without salt on them.  · Eating 4 to 5 small meals rather than 3 large meals a day also may help the nausea and vomiting.  · Drinking liquids between meals instead of during meals also seems to help nausea and vomiting.  · A physical sexual relationship may be continued throughout pregnancy if there are no other problems. Problems may be early (premature) leaking of amniotic fluid from the membranes, vaginal bleeding, or belly (abdominal) pain.  · Exercise regularly if there are no restrictions. Check with your caregiver or physical therapist if you are unsure of the safety of some of your exercises. Greater weight gain will occur in the last 2 trimesters of pregnancy. Exercising will help:  · Control your weight.  · Keep you in shape.  · Prepare you for labor and delivery.  · Help you lose your pregnancy weight after you deliver your baby.  · Wear a good support or jogging bra for breast tenderness during pregnancy. This may help if worn during sleep too.  · Ask when prenatal classes are available. Begin classes when they are offered.  · Do not use hot tubs, steam rooms, or saunas.  · Wear your seat belt when driving. This protects you and your baby if you are in an accident.  · Avoid raw meat, uncooked cheese, cat litter boxes, and soil used by cats throughout the pregnancy. These carry germs that can cause birth defects in the baby.  · The first trimester is a good time to visit your dentist for your dental health. Getting your teeth cleaned is okay. Use a softer toothbrush and brush gently during pregnancy.  · Ask for help if you have financial, counseling, or nutritional needs  during pregnancy. Your caregiver will be able to offer counseling for these needs as well as refer you for other special needs.  · Do not take any medicines or herbs unless told by your caregiver.  · Inform your caregiver if there is any mental or physical domestic violence.  · Make a list of emergency phone numbers of family, friends, hospital, and police and fire departments.  · Write down your questions. Take them to your prenatal visit.  · Do not douche.  · Do not cross your legs.  · If you have to stand for long periods of time, rotate you feet or take small steps in a circle.  · You may have more vaginal secretions that may require a sanitary pad. Do not use tampons or scented sanitary pads.  MEDICINES AND DRUG USE IN PREGNANCY  ·   Take prenatal vitamins as directed. The vitamin should contain 1 milligram of folic acid. Keep all vitamins out of reach of children. Only a couple vitamins or tablets containing iron may be fatal to a baby or young child when ingested.  · Avoid use of all medicines, including herbs, over-the-counter medicines, not prescribed or suggested by your caregiver. Only take over-the-counter or prescription medicines for pain, discomfort, or fever as directed by your caregiver. Do not use aspirin, ibuprofen, or naproxen unless directed by your caregiver.  · Let your caregiver also know about herbs you may be using.  · Alcohol is related to a number of birth defects. This includes fetal alcohol syndrome. All alcohol, in any form, should be avoided completely. Smoking will cause low birth rate and premature babies.  · Street or illegal drugs are very harmful to the baby. They are absolutely forbidden. A baby born to an addicted mother will be addicted at birth. The baby will go through the same withdrawal an adult does.  · Let your caregiver know about any medicines that you have to take and for what reason you take them.  SEEK MEDICAL CARE IF:   You have any concerns or worries during your  pregnancy. It is better to call with your questions if you feel they cannot wait, rather than worry about them.  SEEK IMMEDIATE MEDICAL CARE IF:   · An unexplained oral temperature above 102° F (38.9° C) develops, or as your caregiver suggests.  · You have leaking of fluid from the vagina (birth canal). If leaking membranes are suspected, take your temperature and inform your caregiver of this when you call.  · There is vaginal spotting or bleeding. Notify your caregiver of the amount and how many pads are used.  · You develop a bad smelling vaginal discharge with a change in the color.  · You continue to feel sick to your stomach (nauseated) and have no relief from remedies suggested. You vomit blood or coffee ground-like materials.  · You lose more than 2 pounds of weight in 1 week.  · You gain more than 2 pounds of weight in 1 week and you notice swelling of your face, hands, feet, or legs.  · You gain 5 pounds or more in 1 week (even if you do not have swelling of your hands, face, legs, or feet).  · You get exposed to German measles and have never had them.  · You are exposed to fifth disease or chickenpox.  · You develop belly (abdominal) pain. Round ligament discomfort is a common non-cancerous (benign) cause of abdominal pain in pregnancy. Your caregiver still must evaluate this.  · You develop headache, fever, diarrhea, pain with urination, or shortness of breath.  · You fall or are in a car accident or have any kind of trauma.  · There is mental or physical violence in your home.  Document Released: 03/13/2001 Document Revised: 12/12/2011 Document Reviewed: 09/14/2008  [employer]® Patient Information ©2014 [employer].

## 2013-05-13 ENCOUNTER — Ambulatory Visit: Payer: Medicaid Other | Admitting: Family Medicine

## 2013-05-13 ENCOUNTER — Telehealth: Payer: Self-pay | Admitting: Family Medicine

## 2013-05-13 NOTE — Telephone Encounter (Signed)
Pt was told that she could pick up her pre-natal vitamins at her pharmacy, but no prescription was received. Can we call this please. jw

## 2013-05-14 ENCOUNTER — Other Ambulatory Visit: Payer: Self-pay | Admitting: Family Medicine

## 2013-05-14 DIAGNOSIS — Z3490 Encounter for supervision of normal pregnancy, unspecified, unspecified trimester: Secondary | ICD-10-CM

## 2013-05-14 MED ORDER — PRENATAL MULTIVITAMIN CH: 1.0000 | ORAL_TABLET | Freq: Every day | ORAL | Status: DC

## 2013-05-14 NOTE — Telephone Encounter (Signed)
Prenatal vitamins prescribed

## 2013-05-14 NOTE — Telephone Encounter (Signed)
Spoke with patient and informed her of below 

## 2013-05-27 ENCOUNTER — Ambulatory Visit (INDEPENDENT_AMBULATORY_CARE_PROVIDER_SITE_OTHER): Payer: Medicaid Other | Admitting: *Deleted

## 2013-05-27 ENCOUNTER — Encounter: Payer: Self-pay | Admitting: *Deleted

## 2013-05-27 VITALS — BP 108/82 | Wt 197.0 lb

## 2013-05-27 DIAGNOSIS — Z3490 Encounter for supervision of normal pregnancy, unspecified, unspecified trimester: Secondary | ICD-10-CM

## 2013-05-27 DIAGNOSIS — Z3480 Encounter for supervision of other normal pregnancy, unspecified trimester: Secondary | ICD-10-CM

## 2013-05-27 MED ORDER — PRENATAL MULTIVITAMIN CH: 1.0000 | ORAL_TABLET | Freq: Every day | ORAL | Status: DC

## 2013-05-27 NOTE — Progress Notes (Signed)
P = [phone] 

## 2013-05-28 LAB — GC/CHLAMYDIA PROBE AMP, URINE
Chlamydia, Swab/Urine, PCR: NEGATIVE
GC Probe Amp, Urine: NEGATIVE

## 2013-05-29 LAB — OBSTETRIC PANEL
Antibody Screen: NEGATIVE
Basophils Absolute: 0 10*3/uL (ref 0.0–0.1)
Basophils Relative: 0 % (ref 0–1)
Eosinophils Absolute: 0.2 10*3/uL (ref 0.0–0.7)
Eosinophils Relative: 2 % (ref 0–5)
HCT: 36.4 % (ref 36.0–46.0)
Hemoglobin: 12.8 g/dL (ref 12.0–15.0)
Hepatitis B Surface Ag: NEGATIVE
Lymphocytes Relative: 36 % (ref 12–46)
Lymphs Abs: 3.4 10*3/uL (ref 0.7–4.0)
MCH: 32 pg (ref 26.0–34.0)
MCHC: 35.2 g/dL (ref 30.0–36.0)
MCV: 91 fL (ref 78.0–100.0)
Monocytes Absolute: 0.9 10*3/uL (ref 0.1–1.0)
Monocytes Relative: 10 % (ref 3–12)
Neutro Abs: 4.9 10*3/uL (ref 1.7–7.7)
Neutrophils Relative %: 52 % (ref 43–77)
Platelets: 273 10*3/uL (ref 150–400)
RBC: 4 MIL/uL (ref 3.87–5.11)
RDW: 12.6 % (ref 11.5–15.5)
Rh Type: POSITIVE
Rubella: 2.01 Index — ABNORMAL HIGH (ref <0.90)
WBC: 9.4 10*3/uL (ref 4.0–10.5)

## 2013-05-29 LAB — CYSTIC FIBROSIS DIAGNOSTIC STUDY

## 2013-05-29 LAB — HIV ANTIBODY (ROUTINE TESTING W REFLEX): HIV: NONREACTIVE

## 2013-05-30 LAB — CULTURE, OB URINE: Colony Count: 65000

## 2013-06-02 ENCOUNTER — Telehealth: Payer: Self-pay | Admitting: *Deleted

## 2013-06-02 MED ORDER — ONDANSETRON HCL 8 MG PO TABS: 8.0000 mg | ORAL_TABLET | Freq: Three times a day (TID) | ORAL | Status: DC | PRN

## 2013-06-02 NOTE — Telephone Encounter (Signed)
Patient called requesting something to take for nausea and vomiting.  It started yesterday and she is not sure if its pregnancy sickness or a virus.  We will call in zofran for her and she will call us back if her symptoms worsen or persist.

## 2013-06-12 ENCOUNTER — Ambulatory Visit (INDEPENDENT_AMBULATORY_CARE_PROVIDER_SITE_OTHER): Payer: Medicaid Other | Admitting: Obstetrics and Gynecology

## 2013-06-12 ENCOUNTER — Other Ambulatory Visit (HOSPITAL_COMMUNITY)
Admission: RE | Admit: 2013-06-12 | Discharge: 2013-06-12 | Disposition: A | Discharge status: Home / Self Care | Payer: Medicaid Other | Source: Ambulatory Visit | Attending: Obstetrics and Gynecology | Admitting: Obstetrics and Gynecology

## 2013-06-12 ENCOUNTER — Encounter: Payer: Self-pay | Admitting: Obstetrics and Gynecology

## 2013-06-12 VITALS — BP 106/87 | Wt 199.0 lb

## 2013-06-12 DIAGNOSIS — Z01419 Encounter for gynecological examination (general) (routine) without abnormal findings: Secondary | ICD-10-CM | POA: Insufficient documentation

## 2013-06-12 DIAGNOSIS — Z3480 Encounter for supervision of other normal pregnancy, unspecified trimester: Secondary | ICD-10-CM

## 2013-06-12 DIAGNOSIS — Z1151 Encounter for screening for human papillomavirus (HPV): Secondary | ICD-10-CM | POA: Insufficient documentation

## 2013-06-12 MED ORDER — CEPHALEXIN 500 MG PO CAPS: 500.0000 mg | ORAL_CAPSULE | Freq: Four times a day (QID) | ORAL | Status: DC

## 2013-06-12 NOTE — Progress Notes (Signed)
   Subjective:    Krystal Zamora is a [id number] being seen today for her first obstetrical visit.  Her obstetrical history is significant for 2 previous vaginal birth, positive hepatits C- only completed 1 month of interferon as she could not tolerate symptoms. Patient does not intend to breast feed. Pregnancy history fully reviewed.  Patient reports no complaints.  Filed Vitals:   06/12/13 1000  BP: 106/87  Weight: 199 lb (90.266 kg)    HISTORY: OB History  Gravida Para Term Preterm AB SAB TAB Ectopic Multiple Living  6 2 2  3     2     # Outcome Date GA Lbr Len/2nd Weight Sex Delivery Anes PTL Lv  6 CUR           5 ABT 2014          4 TRM 2007   6 lb 5 oz (2.863 kg) M      3 ABT 2006          2 TRM 1997   9 lb (4.082 kg) F         Comments: adopted  1 ABT 1996             Past Medical History  Diagnosis Date  . Hepatitis C virus 2004    s/p one month interferon treatment   . Hx of abnormal Pap smear 1997    s/p LEEP procedure.  . Hepatitis C   . Aspiration of vomit 2004  . 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 Aspiration biopsy      in ICU for aspiration of vomit for 7 weeks   Family History  Problem Relation Age of Onset  . Other Neg Hx      Exam    Uterus:     Pelvic Exam:    Perineum: Normal Perineum   Vulva: normal   Vagina:  normal mucosa, normal discharge   pH:    Cervix: multiparous appearance, closed and long   Adnexa: normal adnexa and no mass, fullness, tenderness   Bony Pelvis: android  System: Breast:  normal appearance, no masses or tenderness   Skin: normal coloration and turgor, no rashes    Neurologic: oriented, no focal deficits   Extremities: normal strength, tone, and muscle mass   HEENT extra ocular movement intact   Mouth/Teeth mucous membranes moist, pharynx normal without lesions   Neck supple and no masses   Cardiovascular: regular rate and rhythm   Respiratory:  chest clear, no wheezing, crepitations, rhonchi, normal symmetric air entry   Abdomen: soft, non-tender; bowel sounds normal; no masses,  no organomegaly   Urinary:       Assessment:    Pregnancy: [id number] Patient Active Problem List   Diagnosis Date Noted  . Supervision of other normal pregnancy 06/12/2013  . Positive pregnancy test 05/11/2013  . Exposure to communicable disease 07/18/2011  . Irritability 05/21/2011  . Hepatitis C 05/16/2011        Plan:     Initial labs drawn. Prenatal vitamins. Problem list reviewed and updated. Genetic Screening discussed First Screen: declined.  Ultrasound discussed; fetal survey: requested.  Follow up in 4 weeks. 50% of 30 min visit spent on counseling and coordination of care.     Krystal Zamora 06/12/2013

## 2013-06-12 NOTE — Progress Notes (Signed)
P=[phone] 

## 2013-06-12 NOTE — Patient Instructions (Signed)
Pregnancy - First Trimester During sexual intercourse, millions of sperm go into the vagina. Only 1 sperm will penetrate and fertilize the female egg while it is in the Fallopian tube. One week later, the fertilized egg implants into the wall of the uterus. An embryo begins to develop into a baby. At 6 to 8 weeks, the eyes and face are formed and the heartbeat can be seen on ultrasound. At the end of 12 weeks (first trimester), all the baby's organs are formed. Now that you are pregnant, you will want to do everything you can to have a healthy baby. Two of the most important things are to get good prenatal care and follow your caregiver's instructions. Prenatal care is all the medical care you receive before the baby's birth. It is given to prevent, find, and treat problems during the pregnancy and childbirth. PRENATAL EXAMS  During prenatal visits, your weight, blood pressure, and urine are checked. This is done to make sure you are healthy and progressing normally during the pregnancy.  A pregnant woman should gain 25 to 35 pounds during the pregnancy. However, if you are overweight or underweight, your caregiver will advise you regarding your weight.  Your caregiver will ask and answer questions for you.  Blood work, cervical cultures, other necessary tests, and a Pap test are done during your prenatal exams. These tests are done to check on your health and the probable health of your baby. Tests are strongly recommended and done for HIV with your permission. This is the virus that causes AIDS. These tests are done because medicines can be given to help prevent your baby from being born with this infection should you have been infected without knowing it. Blood work is also used to find out your blood type, previous infections, and follow your blood levels (hemoglobin).  Low hemoglobin (anemia) is common during pregnancy. Iron and vitamins are given to help prevent this. Later in the pregnancy,  blood tests for diabetes will be done along with any other tests if any problems develop.  You may need other tests to make sure you and the baby are doing well. CHANGES DURING THE FIRST TRIMESTER  Your body goes through many changes during pregnancy. They vary from person to person. Talk to your caregiver about changes you notice and are concerned about. Changes can include:  Your menstrual period stops.  The egg and sperm carry the genes that determine what you look like. Genes from you and your partner are forming a baby. The female genes determine whether the baby is a boy or a girl.  Your body increases in girth and you may feel bloated.  Feeling sick to your stomach (nauseous) and throwing up (vomiting). If the vomiting is uncontrollable, call your caregiver.  Your breasts will begin to enlarge and become tender.  Your nipples may stick out more and become darker.  The need to urinate more. Painful urination may mean you have a bladder infection.  Tiring easily.  Loss of appetite.  Cravings for certain kinds of food.  At first, you may gain or lose a couple of pounds.  You may have changes in your emotions from day to day (excited to be pregnant or concerned something may go wrong with the pregnancy and baby).  You may have more vivid and strange dreams. HOME CARE INSTRUCTIONS   It is very important to avoid all smoking, alcohol and non-prescribed drugs during your pregnancy. These affect the formation and growth of the baby.   Avoid chemicals while pregnant to ensure the delivery of a healthy infant.  Start your prenatal visits by the 12th week of pregnancy. They are usually scheduled monthly at first, then more often in the last 2 months before delivery. Keep your caregiver's appointments. Follow your caregiver's instructions regarding medicine use, blood and lab tests, exercise, and diet.  During pregnancy, you are providing food for you and your baby. Eat regular,  well-balanced meals. Choose foods such as meat, fish, milk and other low fat dairy products, vegetables, fruits, and whole-grain breads and cereals. Your caregiver will tell you of the ideal weight gain.  You can help morning sickness by keeping soda crackers at the bedside. Eat a couple before arising in the morning. You may want to use the crackers without salt on them.  Eating 4 to 5 small meals rather than 3 large meals a day also may help the nausea and vomiting.  Drinking liquids between meals instead of during meals also seems to help nausea and vomiting.  A physical sexual relationship may be continued throughout pregnancy if there are no other problems. Problems may be early (premature) leaking of amniotic fluid from the membranes, vaginal bleeding, or belly (abdominal) pain.  Exercise regularly if there are no restrictions. Check with your caregiver or physical therapist if you are unsure of the safety of some of your exercises. Greater weight gain will occur in the last 2 trimesters of pregnancy. Exercising will help:  Control your weight.  Keep you in shape.  Prepare you for labor and delivery.  Help you lose your pregnancy weight after you deliver your baby.  Wear a good support or jogging bra for breast tenderness during pregnancy. This may help if worn during sleep too.  Ask when prenatal classes are available. Begin classes when they are offered.  Do not use hot tubs, steam rooms, or saunas.  Wear your seat belt when driving. This protects you and your baby if you are in an accident.  Avoid raw meat, uncooked cheese, cat litter boxes, and soil used by cats throughout the pregnancy. These carry germs that can cause birth defects in the baby.  The first trimester is a good time to visit your dentist for your dental health. Getting your teeth cleaned is okay. Use a softer toothbrush and brush gently during pregnancy.  Ask for help if you have financial, counseling, or  nutritional needs during pregnancy. Your caregiver will be able to offer counseling for these needs as well as refer you for other special needs.  Do not take any medicines or herbs unless told by your caregiver.  Inform your caregiver if there is any mental or physical domestic violence.  Make a list of emergency phone numbers of family, friends, hospital, and police and fire departments.  Write down your questions. Take them to your prenatal visit.  Do not douche.  Do not cross your legs.  If you have to stand for long periods of time, rotate you feet or take small steps in a circle.  You may have more vaginal secretions that may require a sanitary pad. Do not use tampons or scented sanitary pads. MEDICINES AND DRUG USE IN PREGNANCY  Take prenatal vitamins as directed. The vitamin should contain 1 milligram of folic acid. Keep all vitamins out of reach of children. Only a couple vitamins or tablets containing iron may be fatal to a baby or young child when ingested.  Avoid use of all medicines, including herbs, over-the-counter medicines, not   prescribed or suggested by your caregiver. Only take over-the-counter or prescription medicines for pain, discomfort, or fever as directed by your caregiver. Do not use aspirin, ibuprofen, or naproxen unless directed by your caregiver.  Let your caregiver also know about herbs you may be using.  Alcohol is related to a number of birth defects. This includes fetal alcohol syndrome. All alcohol, in any form, should be avoided completely. Smoking will cause low birth rate and premature babies.  Street or illegal drugs are very harmful to the baby. They are absolutely forbidden. A baby born to an addicted mother will be addicted at birth. The baby will go through the same withdrawal an adult does.  Let your caregiver know about any medicines that you have to take and for what reason you take them. SEEK MEDICAL CARE IF:  You have any concerns or  worries during your pregnancy. It is better to call with your questions if you feel they cannot wait, rather than worry about them. SEEK IMMEDIATE MEDICAL CARE IF:   An unexplained oral temperature above 102 F (38.9 C) develops, or as your caregiver suggests.  You have leaking of fluid from the vagina (birth canal). If leaking membranes are suspected, take your temperature and inform your caregiver of this when you call.  There is vaginal spotting or bleeding. Notify your caregiver of the amount and how many pads are used.  You develop a bad smelling vaginal discharge with a change in the color.  You continue to feel sick to your stomach (nauseated) and have no relief from remedies suggested. You vomit blood or coffee ground-like materials.  You lose more than 2 pounds of weight in 1 week.  You gain more than 2 pounds of weight in 1 week and you notice swelling of your face, hands, feet, or legs.  You gain 5 pounds or more in 1 week (even if you do not have swelling of your hands, face, legs, or feet).  You get exposed to [place] measles and have never had them.  You are exposed to fifth disease or chickenpox.  You develop belly (abdominal) pain. Round ligament discomfort is a common non-cancerous (benign) cause of abdominal pain in pregnancy. Your caregiver still must evaluate this.  You develop headache, fever, diarrhea, pain with urination, or shortness of breath.  You fall or are in a car accident or have any kind of trauma.  There is mental or physical violence in your home. Document Released: 03/13/2001 Document Revised: 12/12/2011 Document Reviewed: 09/14/2008 [facility] Patient Information 2014 [place].  Contraception Choices Contraception (birth control) is the use of any methods or devices to prevent pregnancy. Below are some methods to help avoid pregnancy. HORMONAL METHODS   Contraceptive implant This is a thin, plastic tube containing progesterone hormone. It  does not contain estrogen hormone. Your health care provider inserts the tube in the inner part of the upper arm. The tube can remain in place for up to 3 years. After 3 years, the implant must be removed. The implant prevents the ovaries from releasing an egg (ovulation), thickens the cervical mucus to prevent sperm from entering the uterus, and thins the lining of the inside of the uterus.  Progesterone-only injections These injections are given every 3 months by your health care provider to prevent pregnancy. This synthetic progesterone hormone stops the ovaries from releasing eggs. It also thickens cervical mucus and changes the uterine lining. This makes it harder for sperm to survive in the uterus.  Birth  control pills These pills contain estrogen and progesterone hormone. They work by preventing the ovaries from releasing eggs (ovulation). They also cause the cervical mucus to thicken, preventing the sperm from entering the uterus. Birth control pills are prescribed by a health care provider.Birth control pills can also be used to treat heavy periods.  Minipill This type of birth control pill contains only the progesterone hormone. They are taken every day of each month and must be prescribed by your health care provider.  Birth control patch The patch contains hormones similar to those in birth control pills. It must be changed once a week and is prescribed by a health care provider.  Vaginal ring The ring contains hormones similar to those in birth control pills. It is left in the vagina for 3 weeks, removed for 1 week, and then a new one is put back in place. The patient must be comfortable inserting and removing the ring from the vagina.A health care provider's prescription is necessary.  Emergency contraception Emergency contraceptives prevent pregnancy after unprotected sexual intercourse. This pill can be taken right after sex or up to 5 days after unprotected sex. It is most effective  the sooner you take the pills after having sexual intercourse. Most emergency contraceptive pills are available without a prescription. Check with your pharmacist. Do not use emergency contraception as your only form of birth control. BARRIER METHODS   Female condom This is a thin sheath (latex or rubber) that is worn over the penis during sexual intercourse. It can be used with spermicide to increase effectiveness.  Female condom. This is a soft, loose-fitting sheath that is put into the vagina before sexual intercourse.  Diaphragm This is a soft, latex, dome-shaped barrier that must be fitted by a health care provider. It is inserted into the vagina, along with a spermicidal jelly. It is inserted before intercourse. The diaphragm should be left in the vagina for 6 to 8 hours after intercourse.  Cervical cap This is a round, soft, latex or plastic cup that fits over the cervix and must be fitted by a health care provider. The cap can be left in place for up to 48 hours after intercourse.  Sponge This is a soft, circular piece of polyurethane foam. The sponge has spermicide in it. It is inserted into the vagina after wetting it and before sexual intercourse.  Spermicides These are chemicals that kill or block sperm from entering the cervix and uterus. They come in the form of creams, jellies, suppositories, foam, or tablets. They do not require a prescription. They are inserted into the vagina with an applicator before having sexual intercourse. The process must be repeated every time you have sexual intercourse. INTRAUTERINE CONTRACEPTION  Intrauterine device (IUD) This is a T-shaped device that is put in a woman's uterus during a menstrual period to prevent pregnancy. There are 2 types:  Copper IUD This type of IUD is wrapped in copper wire and is placed inside the uterus. Copper makes the uterus and fallopian tubes produce a fluid that kills sperm. It can stay in place for 10 years.  Hormone IUD  This type of IUD contains the hormone progestin (synthetic progesterone). The hormone thickens the cervical mucus and prevents sperm from entering the uterus, and it also thins the uterine lining to prevent implantation of a fertilized egg. The hormone can weaken or kill the sperm that get into the uterus. It can stay in place for 3 5 years, depending on which type of  IUD is used. PERMANENT METHODS OF CONTRACEPTION  Female tubal ligation This is when the woman's fallopian tubes are surgically sealed, tied, or blocked to prevent the egg from traveling to the uterus.  Hysteroscopic sterilization This involves placing a small coil or insert into each fallopian tube. Your doctor uses a technique called hysteroscopy to do the procedure. The device causes scar tissue to form. This results in permanent blockage of the fallopian tubes, so the sperm cannot fertilize the egg. It takes about 3 months after the procedure for the tubes to become blocked. You must use another form of birth control for these 3 months.  Female sterilization This is when the female has the tubes that carry sperm tied off (vasectomy).This blocks sperm from entering the vagina during sexual intercourse. After the procedure, the man can still ejaculate fluid (semen). NATURAL PLANNING METHODS  Natural family planning This is not having sexual intercourse or using a barrier method (condom, diaphragm, cervical cap) on days the woman could become pregnant.  Calendar method This is keeping track of the length of each menstrual cycle and identifying when you are fertile.  Ovulation method This is avoiding sexual intercourse during ovulation.  Symptothermal method This is avoiding sexual intercourse during ovulation, using a thermometer and ovulation symptoms.  Post ovulation method This is timing sexual intercourse after you have ovulated. Regardless of which type or method of contraception you choose, it is important that you use condoms to  protect against the transmission of sexually transmitted infections (STIs). Talk with your health care provider about which form of contraception is most appropriate for you. Document Released: 03/19/2005 Document Revised: 11/19/2012 Document Reviewed: 09/11/2012 [facility] Patient Information 2014 [place].  Breastfeeding Deciding to breastfeed is one of the best choices you can make for you and your baby. A change in hormones during pregnancy causes your breast tissue to grow and increases the number and size of your milk ducts. These hormones also allow proteins, sugars, and fats from your blood supply to make breast milk in your milk-producing glands. Hormones prevent breast milk from being released before your baby is born as well as prompt milk flow after birth. Once breastfeeding has begun, thoughts of your baby, as well as his or her sucking or crying, can stimulate the release of milk from your milk-producing glands.  BENEFITS OF BREASTFEEDING For Your Baby  Your first milk (colostrum) helps your baby's digestive system function better.   There are antibodies in your milk that help your baby fight off infections.   Your baby has a lower incidence of asthma, allergies, and sudden infant death syndrome.   The nutrients in breast milk are better for your baby than infant formulas and are designed uniquely for your baby's needs.   Breast milk improves your baby's brain development.   Your baby is less likely to develop other conditions, such as childhood obesity, asthma, or type 2 diabetes mellitus.  For You   Breastfeeding helps to create a very special bond between you and your baby.   Breastfeeding is convenient. Breast milk is always available at the correct temperature and costs nothing.   Breastfeeding helps to burn calories and helps you lose the weight gained during pregnancy.   Breastfeeding makes your uterus contract to its prepregnancy size faster and slows  bleeding (lochia) after you give birth.   Breastfeeding helps to lower your risk of developing type 2 diabetes mellitus, osteoporosis, and breast or ovarian cancer later in life. SIGNS THAT YOUR  BABY IS HUNGRY Early Signs of Hunger  Increased alertness or activity.  Stretching.  Movement of the head from side to side.  Movement of the head and opening of the mouth when the corner of the mouth or cheek is stroked (rooting).  Increased sucking sounds, smacking lips, cooing, sighing, or squeaking.  Hand-to-mouth movements.  Increased sucking of fingers or hands. Late Signs of Hunger  Fussing.  Intermittent crying. Extreme Signs of Hunger Signs of extreme hunger will require calming and consoling before your baby will be able to breastfeed successfully. Do not wait for the following signs of extreme hunger to occur before you initiate breastfeeding:   Restlessness.  A loud, strong cry.   Screaming. BREASTFEEDING BASICS Breastfeeding Initiation  Find a comfortable place to sit or lie down, with your neck and back well supported.  Place a pillow or rolled up blanket under your baby to bring him or her to the level of your breast (if you are seated). Nursing pillows are specially designed to help support your arms and your baby while you breastfeed.  Make sure that your baby's abdomen is facing your abdomen.   Gently massage your breast. With your fingertips, massage from your chest wall toward your nipple in a circular motion. This encourages milk flow. You may need to continue this action during the feeding if your milk flows slowly.  Support your breast with 4 fingers underneath and your thumb above your nipple. Make sure your fingers are well away from your nipple and your baby's mouth.   Stroke your baby's lips gently with your finger or nipple.   When your baby's mouth is open wide enough, quickly bring your baby to your breast, placing your entire nipple and as  much of the colored area around your nipple (areola) as possible into your baby's mouth.   More areola should be visible above your baby's upper lip than below the lower lip.   Your baby's tongue should be between his or her lower gum and your breast.   Ensure that your baby's mouth is correctly positioned around your nipple (latched). Your baby's lips should create a seal on your breast and be turned out (everted).  It is common for your baby to suck about 2 3 minutes in order to start the flow of breast milk. Latching Teaching your baby how to latch on to your breast properly is very important. An improper latch can cause nipple pain and decreased milk supply for you and poor weight gain in your baby. Also, if your baby is not latched onto your nipple properly, he or she may swallow some air during feeding. This can make your baby fussy. Burping your baby when you switch breasts during the feeding can help to get rid of the air. However, teaching your baby to latch on properly is still the best way to prevent fussiness from swallowing air while breastfeeding. Signs that your baby has successfully latched on to your nipple:    Silent tugging or silent sucking, without causing you pain.   Swallowing heard between every 3 4 sucks.    Muscle movement above and in front of his or her ears while sucking.  Signs that your baby has not successfully latched on to nipple:   Sucking sounds or smacking sounds from your baby while breastfeeding.  Nipple pain. If you think your baby has not latched on correctly, slip your finger into the corner of your baby's mouth to break the suction and place  it between your baby's gums. Attempt breastfeeding initiation again. Signs of Successful Breastfeeding Signs from your baby:   A gradual decrease in the number of sucks or complete cessation of sucking.   Falling asleep.   Relaxation of his or her body.   Retention of a small amount of milk in  his or her mouth.   Letting go of your breast by himself or herself. Signs from you:  Breasts that have increased in firmness, weight, and size 1 3 hours after feeding.   Breasts that are softer immediately after breastfeeding.  Increased milk volume, as well as a change in milk consistency and color by the 5th day of breastfeeding.   Nipples that are not sore, cracked, or bleeding. Signs That Your Baby is Getting Enough Milk  Wetting at least 3 diapers in a 24-hour period. The urine should be clear and pale yellow by age 5 days.  At least 3 stools in a 24-hour period by age 5 days. The stool should be soft and yellow.  At least 3 stools in a 24-hour period by age 7 days. The stool should be seedy and yellow.  No loss of weight greater than 10% of birth weight during the first 3 days of age.  Average weight gain of 4 7 ounces (120 210 mL) per week after age 4 days.  Consistent daily weight gain by age 5 days, without weight loss after the age of 2 weeks. After a feeding, your baby may spit up a small amount. This is common. BREASTFEEDING FREQUENCY AND DURATION Frequent feeding will help you make more milk and can prevent sore nipples and breast engorgement. Breastfeed when you feel the need to reduce the fullness of your breasts or when your baby shows signs of hunger. This is called "breastfeeding on demand." Avoid introducing a pacifier to your baby while you are working to establish breastfeeding (the first 4 6 weeks after your baby is born). After this time you may choose to use a pacifier. Research has shown that pacifier use during the first year of a baby's life decreases the risk of sudden infant death syndrome (SIDS). Allow your baby to feed on each breast as long as he or she wants. Breastfeed until your baby is finished feeding. When your baby unlatches or falls asleep while feeding from the first breast, offer the second breast. Because newborns are often sleepy in the  first few weeks of life, you may need to awaken your baby to get him or her to feed. Breastfeeding times will vary from baby to baby. However, the following rules can serve as a guide to help you ensure that your baby is properly fed:  Newborns (babies 4 weeks of age or younger) may breastfeed every 1 3 hours.  Newborns should not go longer than 3 hours during the day or 5 hours during the night without breastfeeding.  You should breastfeed your baby a minimum of 8 times in a 24-hour period until you begin to introduce solid foods to your baby at around 6 months of age. BREAST MILK PUMPING Pumping and storing breast milk allows you to ensure that your baby is exclusively fed your breast milk, even at times when you are unable to breastfeed. This is especially important if you are going back to work while you are still breastfeeding or when you are not able to be present during feedings. Your lactation consultant can give you guidelines on how long it is safe to store breast   milk.  A breast pump is a machine that allows you to pump milk from your breast into a sterile bottle. The pumped breast milk can then be stored in a refrigerator or freezer. Some breast pumps are operated by hand, while others use electricity. Ask your lactation consultant which type will work best for you. Breast pumps can be purchased, but some hospitals and breastfeeding support groups lease breast pumps on a monthly basis. A lactation consultant can teach you how to hand express breast milk, if you prefer not to use a pump.  CARING FOR YOUR BREASTS WHILE YOU BREASTFEED Nipples can become dry, cracked, and sore while breastfeeding. The following recommendations can help keep your breasts moisturized and healthy:  Avoid using soap on your nipples.   Wear a supportive bra. Although not required, special nursing bras and tank tops are designed to allow access to your breasts for breastfeeding without taking off your entire bra  or top. Avoid wearing underwire style bras or extremely tight bras.  Air dry your nipples for 3 84minutes after each feeding.   Use only cotton bra pads to absorb leaked breast milk. Leaking of breast milk between feedings is normal.   Use lanolin on your nipples after breastfeeding. Lanolin helps to maintain your skin's normal moisture barrier. If you use pure lanolin you do not need to wash it off before feeding your baby again. Pure lanolin is not toxic to your baby. You may also hand express a few drops of breast milk and gently massage that milk into your nipples and allow the milk to air dry. In the first few weeks after giving birth, some women experience extremely full breasts (engorgement). Engorgement can make your breasts feel heavy, warm, and tender to the touch. Engorgement peaks within 3 5 days after you give birth. The following recommendations can help ease engorgement:  Completely empty your breasts while breastfeeding or pumping. You may want to start by applying warm, moist heat (in the shower or with warm water-soaked hand towels) just before feeding or pumping. This increases circulation and helps the milk flow. If your baby does not completely empty your breasts while breastfeeding, pump any extra milk after he or she is finished.  Wear a snug bra (nursing or regular) or tank top for 1 2 days to signal your body to slightly decrease milk production.  Apply ice packs to your breasts, unless this is too uncomfortable for you.  Make sure that your baby is latched on and positioned properly while breastfeeding. If engorgement persists after 48 hours of following these recommendations, contact your health care provider or a Science writer. OVERALL HEALTH CARE RECOMMENDATIONS WHILE BREASTFEEDING  Eat healthy foods. Alternate between meals and snacks, eating 3 of each per day. Because what you eat affects your breast milk, some of the foods may make your baby more irritable  than usual. Avoid eating these foods if you are sure that they are negatively affecting your baby.  Drink milk, fruit juice, and water to satisfy your thirst (about 10 glasses a day).   Rest often, relax, and continue to take your prenatal vitamins to prevent fatigue, stress, and anemia.  Continue breast self-awareness checks.  Avoid chewing and smoking tobacco.  Avoid alcohol and drug use. Some medicines that may be harmful to your baby can pass through breast milk. It is important to ask your health care provider before taking any medicine, including all over-the-counter and prescription medicine as well as vitamin and herbal supplements.  It is possible to become pregnant while breastfeeding. If birth control is desired, ask your health care provider about options that will be safe for your baby. SEEK MEDICAL CARE IF:   You feel like you want to stop breastfeeding or have become frustrated with breastfeeding.  You have painful breasts or nipples.  Your nipples are cracked or bleeding.  Your breasts are red, tender, or warm.  You have a swollen area on either breast.  You have a fever or chills.  You have nausea or vomiting.  You have drainage other than breast milk from your nipples.  Your breasts do not become full before feedings by the 5th day after you give birth.  You feel sad and depressed.  Your baby is too sleepy to eat well.  Your baby is having trouble sleeping.   Your baby is wetting less than 3 diapers in a 24-hour period.  Your baby has less than 3 stools in a 24-hour period.  Your baby's skin or the white part of his or her eyes becomes yellow.   Your baby is not gaining weight by 6 days of age. SEEK IMMEDIATE MEDICAL CARE IF:   Your baby is overly tired (lethargic) and does not want to wake up and feed.  Your baby develops an unexplained fever. Document Released: 03/19/2005 Document Revised: 11/19/2012 Document Reviewed: 09/10/2012 [facility]  Patient Information 2014 [place].

## 2013-06-12 NOTE — Progress Notes (Signed)
Bedside ultrasound show a positive fetal heart rate and CRL of 9wks and 3 days which correlates appropriately with patients LMP dating.

## 2013-06-29 ENCOUNTER — Telehealth: Payer: Self-pay | Admitting: *Deleted

## 2013-06-29 DIAGNOSIS — N390 Urinary tract infection, site not specified: Secondary | ICD-10-CM

## 2013-06-29 MED ORDER — NITROFURANTOIN MONOHYD MACRO 100 MG PO CAPS: 100.0000 mg | ORAL_CAPSULE | Freq: Two times a day (BID) | ORAL | Status: DC

## 2013-06-29 NOTE — Telephone Encounter (Signed)
Patient was not able to take the medication and would like something different called in that she doesn't have to take four times a day.  I have called in macrobid for her and she will follow up on the 13th at her regular scheduled appointment.  She will call back sooner if her symptoms change or worsen.

## 2013-07-02 ENCOUNTER — Inpatient Hospital Stay (HOSPITAL_COMMUNITY): Payer: Medicaid Other

## 2013-07-02 ENCOUNTER — Encounter (HOSPITAL_COMMUNITY): Payer: Self-pay

## 2013-07-02 ENCOUNTER — Inpatient Hospital Stay (HOSPITAL_COMMUNITY)
Admission: AD | Admit: 2013-07-02 | Discharge: 2013-07-02 | Disposition: A | Discharge status: Home / Self Care | Payer: Medicaid Other | Source: Ambulatory Visit | Attending: Obstetrics & Gynecology | Admitting: Obstetrics & Gynecology

## 2013-07-02 DIAGNOSIS — R300 Dysuria: Secondary | ICD-10-CM | POA: Insufficient documentation

## 2013-07-02 DIAGNOSIS — R109 Unspecified abdominal pain: Secondary | ICD-10-CM | POA: Insufficient documentation

## 2013-07-02 DIAGNOSIS — O209 Hemorrhage in early pregnancy, unspecified: Secondary | ICD-10-CM

## 2013-07-02 DIAGNOSIS — Z87891 Personal history of nicotine dependence: Secondary | ICD-10-CM | POA: Insufficient documentation

## 2013-07-02 LAB — CBC
HCT: 33.7 % — ABNORMAL LOW (ref 36.0–46.0)
Hemoglobin: 12 g/dL (ref 12.0–15.0)
MCH: 32.2 pg (ref 26.0–34.0)
MCHC: 35.6 g/dL (ref 30.0–36.0)
MCV: 90.3 fL (ref 78.0–100.0)
Platelets: 207 10*3/uL (ref 150–400)
RBC: 3.73 MIL/uL — ABNORMAL LOW (ref 3.87–5.11)
RDW: 12.4 % (ref 11.5–15.5)
WBC: 8.7 10*3/uL (ref 4.0–10.5)

## 2013-07-02 LAB — WET PREP, GENITAL
Trich, Wet Prep: NONE SEEN
Yeast Wet Prep HPF POC: NONE SEEN

## 2013-07-02 LAB — URINALYSIS, ROUTINE W REFLEX MICROSCOPIC
Bilirubin Urine: NEGATIVE
Glucose, UA: NEGATIVE mg/dL
Hgb urine dipstick: NEGATIVE
Ketones, ur: NEGATIVE mg/dL
Leukocytes, UA: NEGATIVE
Nitrite: NEGATIVE
Protein, ur: NEGATIVE mg/dL
Specific Gravity, Urine: 1.015 (ref 1.005–1.030)
Urobilinogen, UA: 0.2 mg/dL (ref 0.0–1.0)
pH: 6 (ref 5.0–8.0)

## 2013-07-02 NOTE — MAU Note (Signed)
Has been having a lot of cramping and some spotting last few days.  Had an US a few wks ago, everything was fine; this is the first episode of bleeding.  Is currently on meds for a UTI, hx of miscarriage.

## 2013-07-02 NOTE — Discharge Instructions (Signed)
Second Trimester of Pregnancy The second trimester is from week 13 through week 28, months 4 through 6. The second trimester is often a time when you feel your best. Your body has also adjusted to being pregnant, and you begin to feel better physically. Usually, morning sickness has lessened or quit completely, you may have more energy, and you may have an increase in appetite. The second trimester is also a time when the fetus is growing rapidly. At the end of the sixth month, the fetus is about 9 inches long and weighs about 1 pounds. You will likely begin to feel the baby move (quickening) between 18 and 20 weeks of the pregnancy. BODY CHANGES Your body goes through many changes during pregnancy. The changes vary from woman to woman.   Your weight will continue to increase. You will notice your lower abdomen bulging out.  You may begin to get stretch marks on your hips, abdomen, and breasts.  You may develop headaches that can be relieved by medicines approved by your caregiver.  You may urinate more often because the fetus is pressing on your bladder.  You may develop or continue to have heartburn as a result of your pregnancy.  You may develop constipation because certain hormones are causing the muscles that push waste through your intestines to slow down.  You may develop hemorrhoids or swollen, bulging veins (varicose veins).  You may have back pain because of the weight gain and pregnancy hormones relaxing your joints between the bones in your pelvis and as a result of a shift in weight and the muscles that support your balance.  Your breasts will continue to grow and be tender.  Your gums may bleed and may be sensitive to brushing and flossing.  Dark spots or blotches (chloasma, mask of pregnancy) may develop on your face. This will likely fade after the baby is born.  A dark line from your belly button to the pubic area (linea nigra) may appear. This will likely fade after the  baby is born. WHAT TO EXPECT AT YOUR PRENATAL VISITS During a routine prenatal visit:  You will be weighed to make sure you and the fetus are growing normally.  Your blood pressure will be taken.  Your abdomen will be measured to track your baby's growth.  The fetal heartbeat will be listened to.  Any test results from the previous visit will be discussed. Your caregiver may ask you:  How you are feeling.  If you are feeling the baby move.  If you have had any abnormal symptoms, such as leaking fluid, bleeding, severe headaches, or abdominal cramping.  If you have any questions. Other tests that may be performed during your second trimester include:  Blood tests that check for:  Low iron levels (anemia).  Gestational diabetes (between 24 and 28 weeks).  Rh antibodies.  Urine tests to check for infections, diabetes, or protein in the urine.  An ultrasound to confirm the proper growth and development of the baby.  An amniocentesis to check for possible genetic problems.  Fetal screens for spina bifida and Down syndrome. HOME CARE INSTRUCTIONS   Avoid all smoking, herbs, alcohol, and unprescribed drugs. These chemicals affect the formation and growth of the baby.  Follow your caregiver's instructions regarding medicine use. There are medicines that are either safe or unsafe to take during pregnancy.  Exercise only as directed by your caregiver. Experiencing uterine cramps is a good sign to stop exercising.  Continue to eat regular,   healthy meals.  Wear a good support bra for breast tenderness.  Do not use hot tubs, steam rooms, or saunas.  Wear your seat belt at all times when driving.  Avoid raw meat, uncooked cheese, cat litter boxes, and soil used by cats. These carry germs that can cause birth defects in the baby.  Take your prenatal vitamins.  Try taking a stool softener (if your caregiver approves) if you develop constipation. Eat more high-fiber foods,  such as fresh vegetables or fruit and whole grains. Drink plenty of fluids to keep your urine clear or pale yellow.  Take warm sitz baths to soothe any pain or discomfort caused by hemorrhoids. Use hemorrhoid cream if your caregiver approves.  If you develop varicose veins, wear support hose. Elevate your feet for 15 minutes, 3 4 times a day. Limit salt in your diet.  Avoid heavy lifting, wear low heel shoes, and practice good posture.  Rest with your legs elevated if you have leg cramps or low back pain.  Visit your dentist if you have not gone yet during your pregnancy. Use a soft toothbrush to brush your teeth and be gentle when you floss.  A sexual relationship may be continued unless your caregiver directs you otherwise.  Continue to go to all your prenatal visits as directed by your caregiver. SEEK MEDICAL CARE IF:   You have dizziness.  You have mild pelvic cramps, pelvic pressure, or nagging pain in the abdominal area.  You have persistent nausea, vomiting, or diarrhea.  You have a bad smelling vaginal discharge.  You have pain with urination. SEEK IMMEDIATE MEDICAL CARE IF:   You have a fever.  You are leaking fluid from your vagina.  You have spotting or bleeding from your vagina.  You have severe abdominal cramping or pain.  You have rapid weight gain or loss.  You have shortness of breath with chest pain.  You notice sudden or extreme swelling of your face, hands, ankles, feet, or legs.  You have not felt your baby move in over an hour.  You have severe headaches that do not go away with medicine.  You have vision changes. Document Released: 03/13/2001 Document Revised: 11/19/2012 Document Reviewed: 05/20/2012 [employer] Patient Information 2014 [employer].  

## 2013-07-02 NOTE — MAU Provider Note (Signed)
History     CSN: [id number]  Arrival date and time: 07/02/13 1827   None     Chief Complaint  Patient presents with  . Abdominal Cramping  . Vaginal Bleeding   HPI Pt is [id number] [redacted]w[redacted]d pregnant who presents with cramping and spotting when she wipes.  Pt states she  Has been spotting since Monday.  Pt was having spotting and then also had IC on Monday.  Pt is being seen at [facility] and had an [place] there several weeks ago that was normal..  Pt was treated for UTI with treatment for 10 days But pt still has sx of burning/pain with urination that is progressively getting worse. RN note: Has been having a lot of cramping and some spotting last few days. Had an [place] a few wks ago, everything was fine; this is the first episode of bleeding. Is currently on meds for a UTI, hx of miscarriage.       Past Medical History  Diagnosis Date  . Hepatitis C virus 2004    s/p one month interferon treatment   . Hx of abnormal Pap smear 1997    s/p LEEP procedure.  . Hepatitis C   . Aspiration of vomit 2004  . 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 Aspiration biopsy      in ICU for aspiration of vomit for 7 weeks    Family History  Problem Relation Age of Onset  . Other Neg Hx     History  Substance Use Topics  . Smoking status: Former Smoker    Quit date: 12/12/2005  . Smokeless tobacco: Never Used  . 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 Sulfa Antibiotics Hives    Prescriptions prior to admission  Medication Sig Dispense Refill  . nitrofurantoin, macrocrystal-monohydrate, (MACROBID) 100 MG capsule Take 1 capsule (100 mg total) by mouth 2 (two) times daily.  14 capsule  0  . ondansetron (ZOFRAN) 8 MG tablet Take 1 tablet (8 mg total) by mouth every 8 (eight) hours as needed for nausea or vomiting.  20 tablet  0  . Prenatal Vit-Fe  Fumarate-FA (PRENATAL MULTIVITAMIN) TABS tablet Take 1 tablet by mouth daily.  30 tablet  12  . cephALEXin (KEFLEX) 500 MG capsule Take 1 capsule (500 mg total) by mouth 4 (four) times daily.  28 capsule  0    ROS Physical Exam   Blood pressure 117/82, pulse 72, temperature 98.5 F (36.9 C), temperature source Oral, resp. rate 20, height 5\' 5"  (1.651 m), weight 92.987 kg (205 lb), last menstrual period 04/06/2013.  Physical Exam  Nursing note and vitals reviewed. Constitutional: She is oriented to person, place, and time. She appears well-developed and well-nourished. No distress.  Cardiovascular: Normal rate.   Respiratory: Effort normal.  GI: Soft. There is no tenderness.  Genitourinary:   External: no lesion Vagina: small amount of white discharge Cervix: pink, smooth, no CMT Uterus: AGA    Neurological: She is alert and oriented to person, place, and time.  Skin: Skin is warm and dry.  Psychiatric: She has a normal mood and affect.    MAU Course  Procedures Results for orders placed during the hospital encounter of 07/02/13 (from the past 48 hour(s))  URINALYSIS, ROUTINE W REFLEX MICROSCOPIC  Status: Abnormal   Collection Time    07/02/13  6:45 PM      Result Value Ref Range   Color, Urine GREEN (*) YELLOW   Comment: BIOCHEMICALS MAY BE AFFECTED BY COLOR   APPearance CLEAR  CLEAR   Specific Gravity, Urine 1.015  1.005 - 1.030   pH 6.0  5.0 - 8.0   Glucose, UA NEGATIVE  NEGATIVE mg/dL   Hgb urine dipstick NEGATIVE  NEGATIVE   Bilirubin Urine NEGATIVE  NEGATIVE   Ketones, ur NEGATIVE  NEGATIVE mg/dL   Protein, ur NEGATIVE  NEGATIVE mg/dL   Urobilinogen, UA 0.2  0.0 - 1.0 mg/dL   Nitrite NEGATIVE  NEGATIVE   Leukocytes, UA NEGATIVE  NEGATIVE   Comment: MICROSCOPIC NOT DONE ON URINES WITH NEGATIVE PROTEIN, BLOOD, LEUKOCYTES, NITRITE, OR GLUCOSE <1000 mg/dL.  CBC     Status: Abnormal   Collection Time    07/02/13  7:49 PM      Result Value Ref Range   WBC 8.7   4.0 - 10.5 K/uL   RBC 3.73 (*) 3.87 - 5.11 MIL/uL   Hemoglobin 12.0  12.0 - 15.0 g/dL   HCT 16.133.7 (*) 09.636.0 - 04.546.0 %   MCV 90.3  78.0 - 100.0 fL   MCH 32.2  26.0 - 34.0 pg   MCHC 35.6  30.0 - 36.0 g/dL   RDW 40.912.4  81.111.5 - 91.415.5 %   Platelets 207  150 - 400 K/uL   [place] Ob Comp Less 14 Wks  07/02/2013   CLINICAL DATA:  Spotting, cramping  EXAM: OBSTETRIC <14 WK ULTRASOUND  TECHNIQUE: Transabdominal ultrasound was performed for evaluation of the gestation as well as the maternal uterus and adnexal regions.  COMPARISON:  Prior obstetrical ultrasound 12/13/2011  FINDINGS: Intrauterine gestational sac: Single intrauterine gestational sac  Yolk sac:  No longer visible.  Embryo:  Yes  Cardiac Activity:  Yes  Heart Rate: 150 bpm  CRL:   65.7  mm   13 w 0 d                  US EDC: January 07, 2014  Maternal uterus/adnexae: No subchorionic hemorrhage or free intraperitoneal fluid. The bilateral ovaries are unremarkable.  IMPRESSION: Single viable intrauterine gestation. The fetal heart rate is 150 beats per min. By crown-rump length, the estimated gestational age is 13 weeks 0 days. The estimated date of confinement is January 07, 2014.   Electronically Signed   By: Malachy MoanHeath  McCullough M.D.   On: 07/02/2013 21:30   Assessment and Plan  Bleeding in pregnancy Viable IUP [id number] [facility] turned over to [facility] Hogan, CNM  LINEBERRY,SUSAN 07/02/2013, 7:37 PM

## 2013-07-02 NOTE — MAU Provider Note (Signed)
Attestation of Attending Supervision of Advanced Practitioner (CNM/NP): Evaluation and management procedures were performed by the Advanced Practitioner under my supervision and collaboration.  I have reviewed the Advanced Practitioner's note and chart, and I agree with the management and plan.  HARRAWAY-SMITH, Bryttney Netzer 10:31 PM

## 2013-07-04 LAB — URINE CULTURE
Colony Count: NO GROWTH
Culture: NO GROWTH

## 2013-07-06 ENCOUNTER — Other Ambulatory Visit: Payer: Self-pay | Admitting: Family Medicine

## 2013-07-06 ENCOUNTER — Ambulatory Visit (INDEPENDENT_AMBULATORY_CARE_PROVIDER_SITE_OTHER): Payer: Medicaid Other | Admitting: Family Medicine

## 2013-07-06 VITALS — BP 114/77 | Wt 204.0 lb

## 2013-07-06 DIAGNOSIS — Z0489 Encounter for examination and observation for other specified reasons: Secondary | ICD-10-CM

## 2013-07-06 DIAGNOSIS — Z3480 Encounter for supervision of other normal pregnancy, unspecified trimester: Secondary | ICD-10-CM

## 2013-07-06 DIAGNOSIS — O09529 Supervision of elderly multigravida, unspecified trimester: Secondary | ICD-10-CM | POA: Insufficient documentation

## 2013-07-06 DIAGNOSIS — IMO0002 Reserved for concepts with insufficient information to code with codable children: Secondary | ICD-10-CM

## 2013-07-06 NOTE — Patient Instructions (Signed)
Second Trimester of Pregnancy The second trimester is from week 13 through week 28, months 4 through 6. The second trimester is often a time when you feel your best. Your body has also adjusted to being pregnant, and you begin to feel better physically. Usually, morning sickness has lessened or quit completely, you may have more energy, and you may have an increase in appetite. The second trimester is also a time when the fetus is growing rapidly. At the end of the sixth month, the fetus is about 9 inches long and weighs about 1 pounds. You will likely begin to feel the baby move (quickening) between 18 and 20 weeks of the pregnancy. BODY CHANGES Your body goes through many changes during pregnancy. The changes vary from woman to woman.   Your weight will continue to increase. You will notice your lower abdomen bulging out.  You may begin to get stretch marks on your hips, abdomen, and breasts.  You may develop headaches that can be relieved by medicines approved by your caregiver.  You may urinate more often because the fetus is pressing on your bladder.  You may develop or continue to have heartburn as a result of your pregnancy.  You may develop constipation because certain hormones are causing the muscles that push waste through your intestines to slow down.  You may develop hemorrhoids or swollen, bulging veins (varicose veins).  You may have back pain because of the weight gain and pregnancy hormones relaxing your joints between the bones in your pelvis and as a result of a shift in weight and the muscles that support your balance.  Your breasts will continue to grow and be tender.  Your gums may bleed and may be sensitive to brushing and flossing.  Dark spots or blotches (chloasma, mask of pregnancy) may develop on your face. This will likely fade after the baby is born.  A dark line from your belly button to the pubic area (linea nigra) may appear. This will likely fade after the  baby is born. WHAT TO EXPECT AT YOUR PRENATAL VISITS During a routine prenatal visit:  You will be weighed to make sure you and the fetus are growing normally.  Your blood pressure will be taken.  Your abdomen will be measured to track your baby's growth.  The fetal heartbeat will be listened to.  Any test results from the previous visit will be discussed. Your caregiver may ask you:  How you are feeling.  If you are feeling the baby move.  If you have had any abnormal symptoms, such as leaking fluid, bleeding, severe headaches, or abdominal cramping.  If you have any questions. Other tests that may be performed during your second trimester include:  Blood tests that check for:  Low iron levels (anemia).  Gestational diabetes (between 24 and 28 weeks).  Rh antibodies.  Urine tests to check for infections, diabetes, or protein in the urine.  An ultrasound to confirm the proper growth and development of the baby.  An amniocentesis to check for possible genetic problems.  Fetal screens for spina bifida and Down syndrome. HOME CARE INSTRUCTIONS   Avoid all smoking, herbs, alcohol, and unprescribed drugs. These chemicals affect the formation and growth of the baby.  Follow your caregiver's instructions regarding medicine use. There are medicines that are either safe or unsafe to take during pregnancy.  Exercise only as directed by your caregiver. Experiencing uterine cramps is a good sign to stop exercising.  Continue to eat regular,   healthy meals.  Wear a good support bra for breast tenderness.  Do not use hot tubs, steam rooms, or saunas.  Wear your seat belt at all times when driving.  Avoid raw meat, uncooked cheese, cat litter boxes, and soil used by cats. These carry germs that can cause birth defects in the baby.  Take your prenatal vitamins.  Try taking a stool softener (if your caregiver approves) if you develop constipation. Eat more high-fiber foods,  such as fresh vegetables or fruit and whole grains. Drink plenty of fluids to keep your urine clear or pale yellow.  Take warm sitz baths to soothe any pain or discomfort caused by hemorrhoids. Use hemorrhoid cream if your caregiver approves.  If you develop varicose veins, wear support hose. Elevate your feet for 15 minutes, 3 4 times a day. Limit salt in your diet.  Avoid heavy lifting, wear low heel shoes, and practice good posture.  Rest with your legs elevated if you have leg cramps or low back pain.  Visit your dentist if you have not gone yet during your pregnancy. Use a soft toothbrush to brush your teeth and be gentle when you floss.  A sexual relationship may be continued unless your caregiver directs you otherwise.  Continue to go to all your prenatal visits as directed by your caregiver. SEEK MEDICAL CARE IF:   You have dizziness.  You have mild pelvic cramps, pelvic pressure, or nagging pain in the abdominal area.  You have persistent nausea, vomiting, or diarrhea.  You have a bad smelling vaginal discharge.  You have pain with urination. SEEK IMMEDIATE MEDICAL CARE IF:   You have a fever.  You are leaking fluid from your vagina.  You have spotting or bleeding from your vagina.  You have severe abdominal cramping or pain.  You have rapid weight gain or loss.  You have shortness of breath with chest pain.  You notice sudden or extreme swelling of your face, hands, ankles, feet, or legs.  You have not felt your baby move in over an hour.  You have severe headaches that do not go away with medicine.  You have vision changes. Document Released: 03/13/2001 Document Revised: 11/19/2012 Document Reviewed: 05/20/2012 [employer] Patient Information 2014 [employer].  

## 2013-07-06 NOTE — Progress Notes (Signed)
Went to MAU last week with spotting Nml U/S without evidence of subchorionic hemorrhage. Discussed threatened AB and bleeding precautions. Advised about AMA and NIPS. Pt. Desires NIPS at this time--will order Panorama

## 2013-07-06 NOTE — Progress Notes (Signed)
P = [phone] 

## 2013-07-07 LAB — HEPATITIS C ANTIBODY: HCV Ab: REACTIVE — AB

## 2013-07-13 ENCOUNTER — Encounter: Payer: Medicaid Other | Admitting: Family Medicine

## 2013-07-20 ENCOUNTER — Telehealth: Payer: Self-pay | Admitting: *Deleted

## 2013-07-20 DIAGNOSIS — N390 Urinary tract infection, site not specified: Secondary | ICD-10-CM

## 2013-07-20 MED ORDER — NITROFURANTOIN MONOHYD MACRO 100 MG PO CAPS: 100.0000 mg | ORAL_CAPSULE | Freq: Two times a day (BID) | ORAL | Status: DC

## 2013-07-20 NOTE — Telephone Encounter (Signed)
Pt called and is having symptoms of a UTI. I have sent in Macrobid to her pharmacy.  I made pt aware that if her symptoms did not improve after taking this antibiotic then she needed to come into the office for an appt.

## 2013-07-31 ENCOUNTER — Ambulatory Visit (HOSPITAL_COMMUNITY): Payer: Medicaid Other

## 2013-07-31 ENCOUNTER — Encounter: Payer: Self-pay | Admitting: Family Medicine

## 2013-07-31 ENCOUNTER — Encounter: Payer: Medicaid Other | Admitting: Family Medicine

## 2013-07-31 ENCOUNTER — Ambulatory Visit (INDEPENDENT_AMBULATORY_CARE_PROVIDER_SITE_OTHER): Payer: Medicaid Other | Admitting: Family Medicine

## 2013-07-31 VITALS — BP 126/66 | HR 85 | Wt 211.2 lb

## 2013-07-31 DIAGNOSIS — O09529 Supervision of elderly multigravida, unspecified trimester: Secondary | ICD-10-CM

## 2013-07-31 DIAGNOSIS — Z3480 Encounter for supervision of other normal pregnancy, unspecified trimester: Secondary | ICD-10-CM

## 2013-07-31 NOTE — Progress Notes (Signed)
Once or twice has noticed some pink when she wipes.

## 2013-07-31 NOTE — Progress Notes (Signed)
Still with some spotting-- nml Panorama Anatomy in 2 wks.

## 2013-07-31 NOTE — Patient Instructions (Signed)
Breastfeeding Deciding to breastfeed is one of the best choices you can make for you and your baby. A change in hormones during pregnancy causes your breast tissue to grow and increases the number and size of your milk ducts. These hormones also allow proteins, sugars, and fats from your blood supply to make breast milk in your milk-producing glands. Hormones prevent breast milk from being released before your baby is born as well as prompt milk flow after birth. Once breastfeeding has begun, thoughts of your baby, as well as his or her sucking or crying, can stimulate the release of milk from your milk-producing glands.  BENEFITS OF BREASTFEEDING For Your Baby  Your first milk (colostrum) helps your baby's digestive system function better.   There are antibodies in your milk that help your baby fight off infections.   Your baby has a lower incidence of asthma, allergies, and sudden infant death syndrome.   The nutrients in breast milk are better for your baby than infant formulas and are designed uniquely for your baby's needs.   Breast milk improves your baby's brain development.   Your baby is less likely to develop other conditions, such as childhood obesity, asthma, or type 2 diabetes mellitus.  For You   Breastfeeding helps to create a very special bond between you and your baby.   Breastfeeding is convenient. Breast milk is always available at the correct temperature and costs nothing.   Breastfeeding helps to burn calories and helps you lose the weight gained during pregnancy.   Breastfeeding makes your uterus contract to its prepregnancy size faster and slows bleeding (lochia) after you give birth.   Breastfeeding helps to lower your risk of developing type 2 diabetes mellitus, osteoporosis, and breast or ovarian cancer later in life. SIGNS THAT YOUR BABY IS HUNGRY Early Signs of Hunger  Increased alertness or activity.  Stretching.  Movement of the head from  side to side.  Movement of the head and opening of the mouth when the corner of the mouth or cheek is stroked (rooting).  Increased sucking sounds, smacking lips, cooing, sighing, or squeaking.  Hand-to-mouth movements.  Increased sucking of fingers or hands. Late Signs of Hunger  Fussing.  Intermittent crying. Extreme Signs of Hunger Signs of extreme hunger will require calming and consoling before your baby will be able to breastfeed successfully. Do not wait for the following signs of extreme hunger to occur before you initiate breastfeeding:   Restlessness.  A loud, strong cry.   Screaming. BREASTFEEDING BASICS Breastfeeding Initiation  Find a comfortable place to sit or lie down, with your neck and back well supported.  Place a pillow or rolled up blanket under your baby to bring him or her to the level of your breast (if you are seated). Nursing pillows are specially designed to help support your arms and your baby while you breastfeed.  Make sure that your baby's abdomen is facing your abdomen.   Gently massage your breast. With your fingertips, massage from your chest wall toward your nipple in a circular motion. This encourages milk flow. You may need to continue this action during the feeding if your milk flows slowly.  Support your breast with 4 fingers underneath and your thumb above your nipple. Make sure your fingers are well away from your nipple and your baby's mouth.   Stroke your baby's lips gently with your finger or nipple.   When your baby's mouth is open wide enough, quickly bring your baby to your   breast, placing your entire nipple and as much of the colored area around your nipple (areola) as possible into your baby's mouth.   More areola should be visible above your baby's upper lip than below the lower lip.   Your baby's tongue should be between his or her lower gum and your breast.   Ensure that your baby's mouth is correctly positioned  around your nipple (latched). Your baby's lips should create a seal on your breast and be turned out (everted).  It is common for your baby to suck about 2 3 minutes in order to start the flow of breast milk. Latching Teaching your baby how to latch on to your breast properly is very important. An improper latch can cause nipple pain and decreased milk supply for you and poor weight gain in your baby. Also, if your baby is not latched onto your nipple properly, he or she may swallow some air during feeding. This can make your baby fussy. Burping your baby when you switch breasts during the feeding can help to get rid of the air. However, teaching your baby to latch on properly is still the best way to prevent fussiness from swallowing air while breastfeeding. Signs that your baby has successfully latched on to your nipple:    Silent tugging or silent sucking, without causing you pain.   Swallowing heard between every 3 4 sucks.    Muscle movement above and in front of his or her ears while sucking.  Signs that your baby has not successfully latched on to nipple:   Sucking sounds or smacking sounds from your baby while breastfeeding.  Nipple pain. If you think your baby has not latched on correctly, slip your finger into the corner of your baby's mouth to break the suction and place it between your baby's gums. Attempt breastfeeding initiation again. Signs of Successful Breastfeeding Signs from your baby:   A gradual decrease in the number of sucks or complete cessation of sucking.   Falling asleep.   Relaxation of his or her body.   Retention of a small amount of milk in his or her mouth.   Letting go of your breast by himself or herself. Signs from you:  Breasts that have increased in firmness, weight, and size 1 3 hours after feeding.   Breasts that are softer immediately after breastfeeding.  Increased milk volume, as well as a change in milk consistency and color by  the 5th day of breastfeeding.   Nipples that are not sore, cracked, or bleeding. Signs That Your Baby is Getting Enough Milk  Wetting at least 3 diapers in a 24-hour period. The urine should be clear and pale yellow by age 5 days.  At least 3 stools in a 24-hour period by age 5 days. The stool should be soft and yellow.  At least 3 stools in a 24-hour period by age 7 days. The stool should be seedy and yellow.  No loss of weight greater than 10% of birth weight during the first 3 days of age.  Average weight gain of 4 7 ounces (120 210 mL) per week after age 4 days.  Consistent daily weight gain by age 5 days, without weight loss after the age of 2 weeks. After a feeding, your baby may spit up a small amount. This is common. BREASTFEEDING FREQUENCY AND DURATION Frequent feeding will help you make more milk and can prevent sore nipples and breast engorgement. Breastfeed when you feel the need to reduce   the fullness of your breasts or when your baby shows signs of hunger. This is called "breastfeeding on demand." Avoid introducing a pacifier to your baby while you are working to establish breastfeeding (the first 4 6 weeks after your baby is born). After this time you may choose to use a pacifier. Research has shown that pacifier use during the first year of a baby's life decreases the risk of sudden infant death syndrome (SIDS). Allow your baby to feed on each breast as long as he or she wants. Breastfeed until your baby is finished feeding. When your baby unlatches or falls asleep while feeding from the first breast, offer the second breast. Because newborns are often sleepy in the first few weeks of life, you may need to awaken your baby to get him or her to feed. Breastfeeding times will vary from baby to baby. However, the following rules can serve as a guide to help you ensure that your baby is properly fed:  Newborns (babies 4 weeks of age or younger) may breastfeed every 1 3  hours.  Newborns should not go longer than 3 hours during the day or 5 hours during the night without breastfeeding.  You should breastfeed your baby a minimum of 8 times in a 24-hour period until you begin to introduce solid foods to your baby at around 6 months of age. BREAST MILK PUMPING Pumping and storing breast milk allows you to ensure that your baby is exclusively fed your breast milk, even at times when you are unable to breastfeed. This is especially important if you are going back to work while you are still breastfeeding or when you are not able to be present during feedings. Your lactation consultant can give you guidelines on how long it is safe to store breast milk.  A breast pump is a machine that allows you to pump milk from your breast into a sterile bottle. The pumped breast milk can then be stored in a refrigerator or freezer. Some breast pumps are operated by hand, while others use electricity. Ask your lactation consultant which type will work best for you. Breast pumps can be purchased, but some hospitals and breastfeeding support groups lease breast pumps on a monthly basis. A lactation consultant can teach you how to hand express breast milk, if you prefer not to use a pump.  CARING FOR YOUR BREASTS WHILE YOU BREASTFEED Nipples can become dry, cracked, and sore while breastfeeding. The following recommendations can help keep your breasts moisturized and healthy:  Avoid using soap on your nipples.   Wear a supportive bra. Although not required, special nursing bras and tank tops are designed to allow access to your breasts for breastfeeding without taking off your entire bra or top. Avoid wearing underwire style bras or extremely tight bras.  Air dry your nipples for 3 4minutes after each feeding.   Use only cotton bra pads to absorb leaked breast milk. Leaking of breast milk between feedings is normal.   Use lanolin on your nipples after breastfeeding. Lanolin helps to  maintain your skin's normal moisture barrier. If you use pure lanolin you do not need to wash it off before feeding your baby again. Pure lanolin is not toxic to your baby. You may also hand express a few drops of breast milk and gently massage that milk into your nipples and allow the milk to air dry. In the first few weeks after giving birth, some women experience extremely full breasts (engorgement). Engorgement can make   your breasts feel heavy, warm, and tender to the touch. Engorgement peaks within 3 5 days after you give birth. The following recommendations can help ease engorgement:  Completely empty your breasts while breastfeeding or pumping. You may want to start by applying warm, moist heat (in the shower or with warm water-soaked hand towels) just before feeding or pumping. This increases circulation and helps the milk flow. If your baby does not completely empty your breasts while breastfeeding, pump any extra milk after he or she is finished.  Wear a snug bra (nursing or regular) or tank top for 1 2 days to signal your body to slightly decrease milk production.  Apply ice packs to your breasts, unless this is too uncomfortable for you.  Make sure that your baby is latched on and positioned properly while breastfeeding. If engorgement persists after 48 hours of following these recommendations, contact your health care provider or a lactation consultant. OVERALL HEALTH CARE RECOMMENDATIONS WHILE BREASTFEEDING  Eat healthy foods. Alternate between meals and snacks, eating 3 of each per day. Because what you eat affects your breast milk, some of the foods may make your baby more irritable than usual. Avoid eating these foods if you are sure that they are negatively affecting your baby.  Drink milk, fruit juice, and water to satisfy your thirst (about 10 glasses a day).   Rest often, relax, and continue to take your prenatal vitamins to prevent fatigue, stress, and anemia.  Continue  breast self-awareness checks.  Avoid chewing and smoking tobacco.  Avoid alcohol and drug use. Some medicines that may be harmful to your baby can pass through breast milk. It is important to ask your health care provider before taking any medicine, including all over-the-counter and prescription medicine as well as vitamin and herbal supplements. It is possible to become pregnant while breastfeeding. If birth control is desired, ask your health care provider about options that will be safe for your baby. SEEK MEDICAL CARE IF:   You feel like you want to stop breastfeeding or have become frustrated with breastfeeding.  You have painful breasts or nipples.  Your nipples are cracked or bleeding.  Your breasts are red, tender, or warm.  You have a swollen area on either breast.  You have a fever or chills.  You have nausea or vomiting.  You have drainage other than breast milk from your nipples.  Your breasts do not become full before feedings by the 5th day after you give birth.  You feel sad and depressed.  Your baby is too sleepy to eat well.  Your baby is having trouble sleeping.   Your baby is wetting less than 3 diapers in a 24-hour period.  Your baby has less than 3 stools in a 24-hour period.  Your baby's skin or the white part of his or her eyes becomes yellow.   Your baby is not gaining weight by 5 days of age. SEEK IMMEDIATE MEDICAL CARE IF:   Your baby is overly tired (lethargic) and does not want to wake up and feed.  Your baby develops an unexplained fever. Document Released: 03/19/2005 Document Revised: 11/19/2012 Document Reviewed: 09/10/2012 [employer] Patient Information 2014 [employer].  

## 2013-08-11 ENCOUNTER — Telehealth: Payer: Self-pay | Admitting: *Deleted

## 2013-08-11 DIAGNOSIS — N760 Acute vaginitis: Principal | ICD-10-CM

## 2013-08-11 DIAGNOSIS — B9689 Other specified bacterial agents as the cause of diseases classified elsewhere: Secondary | ICD-10-CM

## 2013-08-11 MED ORDER — METRONIDAZOLE 500 MG PO TABS: 500.0000 mg | ORAL_TABLET | Freq: Two times a day (BID) | ORAL | Status: DC

## 2013-08-11 NOTE — Telephone Encounter (Signed)
Pt called and said she has BV.  I have sent in Flagyl to her pharmacy.

## 2013-08-21 ENCOUNTER — Ambulatory Visit (HOSPITAL_COMMUNITY)
Admission: RE | Admit: 2013-08-21 | Discharge: 2013-08-21 | Disposition: A | Discharge status: Home / Self Care | Payer: Medicaid Other | Source: Ambulatory Visit | Attending: Family Medicine | Admitting: Family Medicine

## 2013-08-21 ENCOUNTER — Ambulatory Visit (HOSPITAL_COMMUNITY): Payer: Medicaid Other

## 2013-08-21 ENCOUNTER — Other Ambulatory Visit: Payer: Self-pay | Admitting: Family Medicine

## 2013-08-21 DIAGNOSIS — IMO0002 Reserved for concepts with insufficient information to code with codable children: Secondary | ICD-10-CM

## 2013-08-21 DIAGNOSIS — Z0489 Encounter for examination and observation for other specified reasons: Secondary | ICD-10-CM

## 2013-08-21 DIAGNOSIS — Z1389 Encounter for screening for other disorder: Secondary | ICD-10-CM | POA: Insufficient documentation

## 2013-08-21 DIAGNOSIS — Z363 Encounter for antenatal screening for malformations: Secondary | ICD-10-CM | POA: Insufficient documentation

## 2013-08-25 ENCOUNTER — Telehealth: Payer: Self-pay | Admitting: Family Medicine

## 2013-08-25 ENCOUNTER — Encounter: Payer: Self-pay | Admitting: Family Medicine

## 2013-08-25 NOTE — Telephone Encounter (Signed)
Attempted to inform pt. About U/S results (bilateral fetal renal pyelectasis-dilation of the kidney collecting system)and need for f/u with MFM--no answer on her cell---advised to call back the office.

## 2013-08-25 NOTE — Telephone Encounter (Signed)
Pt. Returned call and discussed the u/s results, likely diagnosis and likely f/u and what might come from the diagnosis.

## 2013-08-31 ENCOUNTER — Encounter: Payer: Self-pay | Admitting: Family Medicine

## 2013-08-31 ENCOUNTER — Ambulatory Visit (INDEPENDENT_AMBULATORY_CARE_PROVIDER_SITE_OTHER): Payer: Medicaid Other | Admitting: Family Medicine

## 2013-08-31 VITALS — BP 115/76 | HR 86 | Wt 223.4 lb

## 2013-08-31 DIAGNOSIS — Z3480 Encounter for supervision of other normal pregnancy, unspecified trimester: Secondary | ICD-10-CM

## 2013-08-31 NOTE — Progress Notes (Signed)
Patient asking about follow up ultrasound to re check the babies kidneys.

## 2013-08-31 NOTE — Progress Notes (Signed)
F/u for fetal renal pyelectasis scheduled. Doing well.

## 2013-08-31 NOTE — Patient Instructions (Signed)
Second Trimester of Pregnancy The second trimester is from week 13 through week 28, months 4 through 6. The second trimester is often a time when you feel your best. Your body has also adjusted to being pregnant, and you begin to feel better physically. Usually, morning sickness has lessened or quit completely, you may have more energy, and you may have an increase in appetite. The second trimester is also a time when the fetus is growing rapidly. At the end of the sixth month, the fetus is about 9 inches long and weighs about 1 pounds. You will likely begin to feel the baby move (quickening) between 18 and 20 weeks of the pregnancy. BODY CHANGES Your body goes through many changes during pregnancy. The changes vary from woman to woman.   Your weight will continue to increase. You will notice your lower abdomen bulging out.  You may begin to get stretch marks on your hips, abdomen, and breasts.  You may develop headaches that can be relieved by medicines approved by your caregiver.  You may urinate more often because the fetus is pressing on your bladder.  You may develop or continue to have heartburn as a result of your pregnancy.  You may develop constipation because certain hormones are causing the muscles that push waste through your intestines to slow down.  You may develop hemorrhoids or swollen, bulging veins (varicose veins).  You may have back pain because of the weight gain and pregnancy hormones relaxing your joints between the bones in your pelvis and as a result of a shift in weight and the muscles that support your balance.  Your breasts will continue to grow and be tender.  Your gums may bleed and may be sensitive to brushing and flossing.  Dark spots or blotches (chloasma, mask of pregnancy) may develop on your face. This will likely fade after the baby is born.  A dark line from your belly button to the pubic area (linea nigra) may appear. This will likely fade after  the baby is born. WHAT TO EXPECT AT YOUR PRENATAL VISITS During a routine prenatal visit:  You will be weighed to make sure you and the fetus are growing normally.  Your blood pressure will be taken.  Your abdomen will be measured to track your baby's growth.  The fetal heartbeat will be listened to.  Any test results from the previous visit will be discussed. Your caregiver may ask you:  How you are feeling.  If you are feeling the baby move.  If you have had any abnormal symptoms, such as leaking fluid, bleeding, severe headaches, or abdominal cramping.  If you have any questions. Other tests that may be performed during your second trimester include:  Blood tests that check for:  Low iron levels (anemia).  Gestational diabetes (between 24 and 28 weeks).  Rh antibodies.  Urine tests to check for infections, diabetes, or protein in the urine.  An ultrasound to confirm the proper growth and development of the baby.  An amniocentesis to check for possible genetic problems.  Fetal screens for spina bifida and Down syndrome. HOME CARE INSTRUCTIONS   Avoid all smoking, herbs, alcohol, and unprescribed drugs. These chemicals affect the formation and growth of the baby.  Follow your caregiver's instructions regarding medicine use. There are medicines that are either safe or unsafe to take during pregnancy.  Exercise only as directed by your caregiver. Experiencing uterine cramps is a good sign to stop exercising.  Continue to eat regular,   healthy meals.  Wear a good support bra for breast tenderness.  Do not use hot tubs, steam rooms, or saunas.  Wear your seat belt at all times when driving.  Avoid raw meat, uncooked cheese, cat litter boxes, and soil used by cats. These carry germs that can cause birth defects in the baby.  Take your prenatal vitamins.  Try taking a stool softener (if your caregiver approves) if you develop constipation. Eat more high-fiber  foods, such as fresh vegetables or fruit and whole grains. Drink plenty of fluids to keep your urine clear or pale yellow.  Take warm sitz baths to soothe any pain or discomfort caused by hemorrhoids. Use hemorrhoid cream if your caregiver approves.  If you develop varicose veins, wear support hose. Elevate your feet for 15 minutes, 3 4 times a day. Limit salt in your diet.  Avoid heavy lifting, wear low heel shoes, and practice good posture.  Rest with your legs elevated if you have leg cramps or low back pain.  Visit your dentist if you have not gone yet during your pregnancy. Use a soft toothbrush to brush your teeth and be gentle when you floss.  A sexual relationship may be continued unless your caregiver directs you otherwise.  Continue to go to all your prenatal visits as directed by your caregiver. SEEK MEDICAL CARE IF:   You have dizziness.  You have mild pelvic cramps, pelvic pressure, or nagging pain in the abdominal area.  You have persistent nausea, vomiting, or diarrhea.  You have a bad smelling vaginal discharge.  You have pain with urination. SEEK IMMEDIATE MEDICAL CARE IF:   You have a fever.  You are leaking fluid from your vagina.  You have spotting or bleeding from your vagina.  You have severe abdominal cramping or pain.  You have rapid weight gain or loss.  You have shortness of breath with chest pain.  You notice sudden or extreme swelling of your face, hands, ankles, feet, or legs.  You have not felt your baby move in over an hour.  You have severe headaches that do not go away with medicine.  You have vision changes. Document Released: 03/13/2001 Document Revised: 11/19/2012 Document Reviewed: 05/20/2012 [employer] Patient Information 2014 [employer].  Breastfeeding Deciding to breastfeed is one of the best choices you can make for you and your baby. A change in hormones during pregnancy causes your breast tissue to grow and increases the  number and size of your milk ducts. These hormones also allow proteins, sugars, and fats from your blood supply to make breast milk in your milk-producing glands. Hormones prevent breast milk from being released before your baby is born as well as prompt milk flow after birth. Once breastfeeding has begun, thoughts of your baby, as well as his or her sucking or crying, can stimulate the release of milk from your milk-producing glands.  BENEFITS OF BREASTFEEDING For Your Baby  Your first milk (colostrum) helps your baby's digestive system function better.   There are antibodies in your milk that help your baby fight off infections.   Your baby has a lower incidence of asthma, allergies, and sudden infant death syndrome.   The nutrients in breast milk are better for your baby than infant formulas and are designed uniquely for your baby's needs.   Breast milk improves your baby's brain development.   Your baby is less likely to develop other conditions, such as childhood obesity, asthma, or type 2 diabetes mellitus.  For   You   Breastfeeding helps to create a very special bond between you and your baby.   Breastfeeding is convenient. Breast milk is always available at the correct temperature and costs nothing.   Breastfeeding helps to burn calories and helps you lose the weight gained during pregnancy.   Breastfeeding makes your uterus contract to its prepregnancy size faster and slows bleeding (lochia) after you give birth.   Breastfeeding helps to lower your risk of developing type 2 diabetes mellitus, osteoporosis, and breast or ovarian cancer later in life. SIGNS THAT YOUR BABY IS HUNGRY Early Signs of Hunger  Increased alertness or activity.  Stretching.  Movement of the head from side to side.  Movement of the head and opening of the mouth when the corner of the mouth or cheek is stroked (rooting).  Increased sucking sounds, smacking lips, cooing, sighing, or  squeaking.  Hand-to-mouth movements.  Increased sucking of fingers or hands. Late Signs of Hunger  Fussing.  Intermittent crying. Extreme Signs of Hunger Signs of extreme hunger will require calming and consoling before your baby will be able to breastfeed successfully. Do not wait for the following signs of extreme hunger to occur before you initiate breastfeeding:   Restlessness.  A loud, strong cry.   Screaming. BREASTFEEDING BASICS Breastfeeding Initiation  Find a comfortable place to sit or lie down, with your neck and back well supported.  Place a pillow or rolled up blanket under your baby to bring him or her to the level of your breast (if you are seated). Nursing pillows are specially designed to help support your arms and your baby while you breastfeed.  Make sure that your baby's abdomen is facing your abdomen.   Gently massage your breast. With your fingertips, massage from your chest wall toward your nipple in a circular motion. This encourages milk flow. You may need to continue this action during the feeding if your milk flows slowly.  Support your breast with 4 fingers underneath and your thumb above your nipple. Make sure your fingers are well away from your nipple and your baby's mouth.   Stroke your baby's lips gently with your finger or nipple.   When your baby's mouth is open wide enough, quickly bring your baby to your breast, placing your entire nipple and as much of the colored area around your nipple (areola) as possible into your baby's mouth.   More areola should be visible above your baby's upper lip than below the lower lip.   Your baby's tongue should be between his or her lower gum and your breast.   Ensure that your baby's mouth is correctly positioned around your nipple (latched). Your baby's lips should create a seal on your breast and be turned out (everted).  It is common for your baby to suck about 2 3 minutes in order to start the  flow of breast milk. Latching Teaching your baby how to latch on to your breast properly is very important. An improper latch can cause nipple pain and decreased milk supply for you and poor weight gain in your baby. Also, if your baby is not latched onto your nipple properly, he or she may swallow some air during feeding. This can make your baby fussy. Burping your baby when you switch breasts during the feeding can help to get rid of the air. However, teaching your baby to latch on properly is still the best way to prevent fussiness from swallowing air while breastfeeding. Signs that your baby has   successfully latched on to your nipple:    Silent tugging or silent sucking, without causing you pain.   Swallowing heard between every 3 4 sucks.    Muscle movement above and in front of his or her ears while sucking.  Signs that your baby has not successfully latched on to nipple:   Sucking sounds or smacking sounds from your baby while breastfeeding.  Nipple pain. If you think your baby has not latched on correctly, slip your finger into the corner of your baby's mouth to break the suction and place it between your baby's gums. Attempt breastfeeding initiation again. Signs of Successful Breastfeeding Signs from your baby:   A gradual decrease in the number of sucks or complete cessation of sucking.   Falling asleep.   Relaxation of his or her body.   Retention of a small amount of milk in his or her mouth.   Letting go of your breast by himself or herself. Signs from you:  Breasts that have increased in firmness, weight, and size 1 3 hours after feeding.   Breasts that are softer immediately after breastfeeding.  Increased milk volume, as well as a change in milk consistency and color by the 5th day of breastfeeding.   Nipples that are not sore, cracked, or bleeding. Signs That Your Baby is Getting Enough Milk  Wetting at least 3 diapers in a 24-hour period. The urine  should be clear and pale yellow by age 5 days.  At least 3 stools in a 24-hour period by age 5 days. The stool should be soft and yellow.  At least 3 stools in a 24-hour period by age 7 days. The stool should be seedy and yellow.  No loss of weight greater than 10% of birth weight during the first 3 days of age.  Average weight gain of 4 7 ounces (120 210 mL) per week after age 4 days.  Consistent daily weight gain by age 5 days, without weight loss after the age of 2 weeks. After a feeding, your baby may spit up a small amount. This is common. BREASTFEEDING FREQUENCY AND DURATION Frequent feeding will help you make more milk and can prevent sore nipples and breast engorgement. Breastfeed when you feel the need to reduce the fullness of your breasts or when your baby shows signs of hunger. This is called "breastfeeding on demand." Avoid introducing a pacifier to your baby while you are working to establish breastfeeding (the first 4 6 weeks after your baby is born). After this time you may choose to use a pacifier. Research has shown that pacifier use during the first year of a baby's life decreases the risk of sudden infant death syndrome (SIDS). Allow your baby to feed on each breast as long as he or she wants. Breastfeed until your baby is finished feeding. When your baby unlatches or falls asleep while feeding from the first breast, offer the second breast. Because newborns are often sleepy in the first few weeks of life, you may need to awaken your baby to get him or her to feed. Breastfeeding times will vary from baby to baby. However, the following rules can serve as a guide to help you ensure that your baby is properly fed:  Newborns (babies 4 weeks of age or younger) may breastfeed every 1 3 hours.  Newborns should not go longer than 3 hours during the day or 5 hours during the night without breastfeeding.  You should breastfeed your baby a minimum of   8 times in a 24-hour period until  you begin to introduce solid foods to your baby at around 6 months of age. BREAST MILK PUMPING Pumping and storing breast milk allows you to ensure that your baby is exclusively fed your breast milk, even at times when you are unable to breastfeed. This is especially important if you are going back to work while you are still breastfeeding or when you are not able to be present during feedings. Your lactation consultant can give you guidelines on how long it is safe to store breast milk.  A breast pump is a machine that allows you to pump milk from your breast into a sterile bottle. The pumped breast milk can then be stored in a refrigerator or freezer. Some breast pumps are operated by hand, while others use electricity. Ask your lactation consultant which type will work best for you. Breast pumps can be purchased, but some hospitals and breastfeeding support groups lease breast pumps on a monthly basis. A lactation consultant can teach you how to hand express breast milk, if you prefer not to use a pump.  CARING FOR YOUR BREASTS WHILE YOU BREASTFEED Nipples can become dry, cracked, and sore while breastfeeding. The following recommendations can help keep your breasts moisturized and healthy:  Avoid using soap on your nipples.   Wear a supportive bra. Although not required, special nursing bras and tank tops are designed to allow access to your breasts for breastfeeding without taking off your entire bra or top. Avoid wearing underwire style bras or extremely tight bras.  Air dry your nipples for 3 4minutes after each feeding.   Use only cotton bra pads to absorb leaked breast milk. Leaking of breast milk between feedings is normal.   Use lanolin on your nipples after breastfeeding. Lanolin helps to maintain your skin's normal moisture barrier. If you use pure lanolin you do not need to wash it off before feeding your baby again. Pure lanolin is not toxic to your baby. You may also hand express a  few drops of breast milk and gently massage that milk into your nipples and allow the milk to air dry. In the first few weeks after giving birth, some women experience extremely full breasts (engorgement). Engorgement can make your breasts feel heavy, warm, and tender to the touch. Engorgement peaks within 3 5 days after you give birth. The following recommendations can help ease engorgement:  Completely empty your breasts while breastfeeding or pumping. You may want to start by applying warm, moist heat (in the shower or with warm water-soaked hand towels) just before feeding or pumping. This increases circulation and helps the milk flow. If your baby does not completely empty your breasts while breastfeeding, pump any extra milk after he or she is finished.  Wear a snug bra (nursing or regular) or tank top for 1 2 days to signal your body to slightly decrease milk production.  Apply ice packs to your breasts, unless this is too uncomfortable for you.  Make sure that your baby is latched on and positioned properly while breastfeeding. If engorgement persists after 48 hours of following these recommendations, contact your health care provider or a lactation consultant. OVERALL HEALTH CARE RECOMMENDATIONS WHILE BREASTFEEDING  Eat healthy foods. Alternate between meals and snacks, eating 3 of each per day. Because what you eat affects your breast milk, some of the foods may make your baby more irritable than usual. Avoid eating these foods if you are sure that they are   negatively affecting your baby.  Drink milk, fruit juice, and water to satisfy your thirst (about 10 glasses a day).   Rest often, relax, and continue to take your prenatal vitamins to prevent fatigue, stress, and anemia.  Continue breast self-awareness checks.  Avoid chewing and smoking tobacco.  Avoid alcohol and drug use. Some medicines that may be harmful to your baby can pass through breast milk. It is important to ask your  health care provider before taking any medicine, including all over-the-counter and prescription medicine as well as vitamin and herbal supplements. It is possible to become pregnant while breastfeeding. If birth control is desired, ask your health care provider about options that will be safe for your baby. SEEK MEDICAL CARE IF:   You feel like you want to stop breastfeeding or have become frustrated with breastfeeding.  You have painful breasts or nipples.  Your nipples are cracked or bleeding.  Your breasts are red, tender, or warm.  You have a swollen area on either breast.  You have a fever or chills.  You have nausea or vomiting.  You have drainage other than breast milk from your nipples.  Your breasts do not become full before feedings by the 5th day after you give birth.  You feel sad and depressed.  Your baby is too sleepy to eat well.  Your baby is having trouble sleeping.   Your baby is wetting less than 3 diapers in a 24-hour period.  Your baby has less than 3 stools in a 24-hour period.  Your baby's skin or the white part of his or her eyes becomes yellow.   Your baby is not gaining weight by 5 days of age. SEEK IMMEDIATE MEDICAL CARE IF:   Your baby is overly tired (lethargic) and does not want to wake up and feed.  Your baby develops an unexplained fever. Document Released: 03/19/2005 Document Revised: 11/19/2012 Document Reviewed: 09/10/2012 [employer] Patient Information 2014 [employer].  

## 2013-09-08 ENCOUNTER — Telehealth: Payer: Self-pay | Admitting: *Deleted

## 2013-09-08 DIAGNOSIS — B9689 Other specified bacterial agents as the cause of diseases classified elsewhere: Secondary | ICD-10-CM

## 2013-09-08 DIAGNOSIS — N760 Acute vaginitis: Principal | ICD-10-CM

## 2013-09-08 MED ORDER — METRONIDAZOLE 500 MG PO TABS: 500.0000 mg | ORAL_TABLET | Freq: Two times a day (BID) | ORAL | Status: DC

## 2013-09-08 NOTE — Telephone Encounter (Signed)
Pt called and is having symptoms of bacterial vaginosis.   I have sent in Flagyl to patients pharmacy.  Pt aware.

## 2013-09-11 ENCOUNTER — Ambulatory Visit (HOSPITAL_COMMUNITY)
Admission: RE | Admit: 2013-09-11 | Discharge: 2013-09-11 | Disposition: A | Discharge status: Home / Self Care | Payer: Medicaid Other | Source: Ambulatory Visit | Attending: Family Medicine | Admitting: Family Medicine

## 2013-09-11 DIAGNOSIS — Z3689 Encounter for other specified antenatal screening: Secondary | ICD-10-CM | POA: Insufficient documentation

## 2013-09-11 DIAGNOSIS — Z3480 Encounter for supervision of other normal pregnancy, unspecified trimester: Secondary | ICD-10-CM

## 2013-09-13 ENCOUNTER — Encounter: Payer: Self-pay | Admitting: Family Medicine

## 2013-09-13 DIAGNOSIS — O358XX0 Maternal care for other (suspected) fetal abnormality and damage, not applicable or unspecified: Secondary | ICD-10-CM | POA: Insufficient documentation

## 2013-09-18 ENCOUNTER — Other Ambulatory Visit (HOSPITAL_COMMUNITY): Payer: Self-pay | Admitting: Obstetrics and Gynecology

## 2013-09-18 DIAGNOSIS — O09529 Supervision of elderly multigravida, unspecified trimester: Secondary | ICD-10-CM

## 2013-09-18 DIAGNOSIS — IMO0001 Reserved for inherently not codable concepts without codable children: Secondary | ICD-10-CM

## 2013-09-18 DIAGNOSIS — O358XX0 Maternal care for other (suspected) fetal abnormality and damage, not applicable or unspecified: Principal | ICD-10-CM

## 2013-09-25 ENCOUNTER — Encounter: Payer: Self-pay | Admitting: Obstetrics & Gynecology

## 2013-09-25 ENCOUNTER — Ambulatory Visit (INDEPENDENT_AMBULATORY_CARE_PROVIDER_SITE_OTHER): Payer: Medicaid Other | Admitting: Obstetrics & Gynecology

## 2013-09-25 VITALS — BP 109/74 | HR 81 | Wt 227.0 lb

## 2013-09-25 DIAGNOSIS — Z3482 Encounter for supervision of other normal pregnancy, second trimester: Secondary | ICD-10-CM

## 2013-09-25 DIAGNOSIS — Z3480 Encounter for supervision of other normal pregnancy, unspecified trimester: Secondary | ICD-10-CM

## 2013-09-25 NOTE — Progress Notes (Signed)
Routine visit. Good FM. No problems. We discussed rec'd weight gain. She declines a nutritionist consult. U/S scheduled for 10/23/13. She will get gluocla, tdap, and labs in 2 weeks.

## 2013-10-13 ENCOUNTER — Ambulatory Visit (INDEPENDENT_AMBULATORY_CARE_PROVIDER_SITE_OTHER): Payer: Medicaid Other | Admitting: Obstetrics & Gynecology

## 2013-10-13 ENCOUNTER — Encounter: Payer: Self-pay | Admitting: Obstetrics & Gynecology

## 2013-10-13 VITALS — BP 125/82 | HR 88 | Wt 235.4 lb

## 2013-10-13 DIAGNOSIS — Z3480 Encounter for supervision of other normal pregnancy, unspecified trimester: Secondary | ICD-10-CM

## 2013-10-13 DIAGNOSIS — Z3483 Encounter for supervision of other normal pregnancy, third trimester: Secondary | ICD-10-CM

## 2013-10-13 NOTE — Progress Notes (Signed)
Routine visit. Good FM. No VB, ROM, or contractions. U/S 10-23-13. She has gained about 45 pounds thus far. I reviewed the risks of this. She is requesting a TSH which I will order. She will get TDAP, glucola and labs today.

## 2013-10-13 NOTE — Progress Notes (Signed)
One hour glucose testing today.  Patient is doing well.  States she feels the baby moving around but not as much as when she first started feeling movement.

## 2013-10-14 LAB — RPR

## 2013-10-14 LAB — TSH: TSH: 1.153 u[IU]/mL (ref 0.350–4.500)

## 2013-10-14 LAB — GLUCOSE TOLERANCE, 1 HOUR (50G) W/O FASTING: Glucose, 1 Hour GTT: 88 mg/dL (ref 70–140)

## 2013-10-14 LAB — HIV ANTIBODY (ROUTINE TESTING W REFLEX): HIV 1&2 Ab, 4th Generation: NONREACTIVE

## 2013-10-23 ENCOUNTER — Ambulatory Visit (HOSPITAL_COMMUNITY)
Admission: RE | Admit: 2013-10-23 | Discharge: 2013-10-23 | Disposition: A | Discharge status: Home / Self Care | Payer: BC Managed Care – PPO | Source: Ambulatory Visit | Attending: Obstetrics & Gynecology | Admitting: Obstetrics & Gynecology

## 2013-10-23 ENCOUNTER — Other Ambulatory Visit (HOSPITAL_COMMUNITY): Payer: Self-pay | Admitting: Obstetrics and Gynecology

## 2013-10-23 ENCOUNTER — Encounter (HOSPITAL_COMMUNITY): Payer: Self-pay

## 2013-10-23 DIAGNOSIS — IMO0001 Reserved for inherently not codable concepts without codable children: Secondary | ICD-10-CM

## 2013-10-23 DIAGNOSIS — Z3689 Encounter for other specified antenatal screening: Secondary | ICD-10-CM | POA: Diagnosis not present

## 2013-10-23 DIAGNOSIS — O09529 Supervision of elderly multigravida, unspecified trimester: Secondary | ICD-10-CM

## 2013-10-23 DIAGNOSIS — O358XX0 Maternal care for other (suspected) fetal abnormality and damage, not applicable or unspecified: Secondary | ICD-10-CM | POA: Diagnosis present

## 2013-10-27 ENCOUNTER — Encounter: Payer: Self-pay | Admitting: Obstetrics & Gynecology

## 2013-10-27 ENCOUNTER — Inpatient Hospital Stay (HOSPITAL_COMMUNITY): Payer: BC Managed Care – PPO

## 2013-10-27 ENCOUNTER — Encounter (HOSPITAL_COMMUNITY): Payer: Self-pay | Admitting: *Deleted

## 2013-10-27 ENCOUNTER — Observation Stay (HOSPITAL_COMMUNITY)
Admission: AD | Admit: 2013-10-27 | Discharge: 2013-10-28 | Disposition: A | Discharge status: Home / Self Care | Payer: BC Managed Care – PPO | Source: Ambulatory Visit | Attending: Obstetrics & Gynecology | Admitting: Obstetrics & Gynecology

## 2013-10-27 ENCOUNTER — Ambulatory Visit (INDEPENDENT_AMBULATORY_CARE_PROVIDER_SITE_OTHER): Payer: BC Managed Care – PPO | Admitting: Obstetrics & Gynecology

## 2013-10-27 VITALS — BP 149/93 | HR 89 | Wt 238.0 lb

## 2013-10-27 DIAGNOSIS — O26859 Spotting complicating pregnancy, unspecified trimester: Secondary | ICD-10-CM

## 2013-10-27 DIAGNOSIS — O169 Unspecified maternal hypertension, unspecified trimester: Secondary | ICD-10-CM | POA: Insufficient documentation

## 2013-10-27 DIAGNOSIS — O289 Unspecified abnormal findings on antenatal screening of mother: Secondary | ICD-10-CM

## 2013-10-27 DIAGNOSIS — Z3480 Encounter for supervision of other normal pregnancy, unspecified trimester: Secondary | ICD-10-CM | POA: Insufficient documentation

## 2013-10-27 DIAGNOSIS — O9989 Other specified diseases and conditions complicating pregnancy, childbirth and the puerperium: Secondary | ICD-10-CM | POA: Insufficient documentation

## 2013-10-27 DIAGNOSIS — Z8619 Personal history of other infectious and parasitic diseases: Secondary | ICD-10-CM | POA: Insufficient documentation

## 2013-10-27 DIAGNOSIS — Z3483 Encounter for supervision of other normal pregnancy, third trimester: Secondary | ICD-10-CM

## 2013-10-27 DIAGNOSIS — O200 Threatened abortion: Secondary | ICD-10-CM | POA: Insufficient documentation

## 2013-10-27 DIAGNOSIS — O283 Abnormal ultrasonic finding on antenatal screening of mother: Secondary | ICD-10-CM

## 2013-10-27 DIAGNOSIS — O4700 False labor before 37 completed weeks of gestation, unspecified trimester: Principal | ICD-10-CM | POA: Diagnosis present

## 2013-10-27 DIAGNOSIS — O358XX0 Maternal care for other (suspected) fetal abnormality and damage, not applicable or unspecified: Secondary | ICD-10-CM

## 2013-10-27 DIAGNOSIS — Z87891 Personal history of nicotine dependence: Secondary | ICD-10-CM | POA: Insufficient documentation

## 2013-10-27 LAB — CBC WITH DIFFERENTIAL/PLATELET
Basophils Absolute: 0 10*3/uL (ref 0.0–0.1)
Basophils Relative: 0 % (ref 0–1)
Eosinophils Absolute: 0.4 10*3/uL (ref 0.0–0.7)
Eosinophils Relative: 3 % (ref 0–5)
HCT: 34.8 % — ABNORMAL LOW (ref 36.0–46.0)
Hemoglobin: 12.3 g/dL (ref 12.0–15.0)
Lymphocytes Relative: 27 % (ref 12–46)
Lymphs Abs: 2.8 10*3/uL (ref 0.7–4.0)
MCH: 33.2 pg (ref 26.0–34.0)
MCHC: 35.3 g/dL (ref 30.0–36.0)
MCV: 94.1 fL (ref 78.0–100.0)
Monocytes Absolute: 0.8 10*3/uL (ref 0.1–1.0)
Monocytes Relative: 8 % (ref 3–12)
Neutro Abs: 6.5 10*3/uL (ref 1.7–7.7)
Neutrophils Relative %: 62 % (ref 43–77)
Platelets: 220 10*3/uL (ref 150–400)
RBC: 3.7 MIL/uL — ABNORMAL LOW (ref 3.87–5.11)
RDW: 12.9 % (ref 11.5–15.5)
WBC: 10.5 10*3/uL (ref 4.0–10.5)

## 2013-10-27 LAB — URINALYSIS, ROUTINE W REFLEX MICROSCOPIC
Bilirubin Urine: NEGATIVE
Glucose, UA: NEGATIVE mg/dL
Hgb urine dipstick: NEGATIVE
Ketones, ur: 15 mg/dL — AB
Leukocytes, UA: NEGATIVE
Nitrite: NEGATIVE
Protein, ur: NEGATIVE mg/dL
Specific Gravity, Urine: 1.02 (ref 1.005–1.030)
Urobilinogen, UA: 0.2 mg/dL (ref 0.0–1.0)
pH: 6 (ref 5.0–8.0)

## 2013-10-27 LAB — COMPREHENSIVE METABOLIC PANEL
ALT: 12 U/L (ref 0–35)
AST: 15 U/L (ref 0–37)
Albumin: 2.4 g/dL — ABNORMAL LOW (ref 3.5–5.2)
Alkaline Phosphatase: 68 U/L (ref 39–117)
Anion gap: 12 (ref 5–15)
BUN: 9 mg/dL (ref 6–23)
CO2: 22 mEq/L (ref 19–32)
Calcium: 9.3 mg/dL (ref 8.4–10.5)
Chloride: 101 mEq/L (ref 96–112)
Creatinine, Ser: 0.4 mg/dL — ABNORMAL LOW (ref 0.50–1.10)
GFR calc Af Amer: >90 mL/min (ref >90)
GFR calc non Af Amer: >90 mL/min (ref >90)
Glucose, Bld: 94 mg/dL (ref 70–99)
Potassium: 3.6 mEq/L — ABNORMAL LOW (ref 3.7–5.3)
Sodium: 135 mEq/L — ABNORMAL LOW (ref 137–147)
Total Bilirubin: 0.3 mg/dL (ref 0.3–1.2)
Total Protein: 6.5 g/dL (ref 6.0–8.3)

## 2013-10-27 LAB — PROTEIN / CREATININE RATIO, URINE
Creatinine, Urine: 83.11 mg/dL
Protein Creatinine Ratio: 0.09 (ref 0.00–0.15)
Total Protein, Urine: 7.7 mg/dL

## 2013-10-27 MED ORDER — CALCIUM CARBONATE ANTACID 500 MG PO CHEW
2.0000 | CHEWABLE_TABLET | ORAL | Status: DC | PRN
  Administered 2013-10-27: 400 mg via ORAL
  Filled 2013-10-27 (×2): qty 1

## 2013-10-27 MED ORDER — PROGESTERONE MICRONIZED 200 MG PO CAPS: 200.0000 mg | ORAL_CAPSULE | Freq: Every day | ORAL | Status: DC

## 2013-10-27 MED ORDER — ACETAMINOPHEN 325 MG PO TABS: 650.0000 mg | ORAL_TABLET | ORAL | Status: DC | PRN

## 2013-10-27 MED ORDER — BETAMETHASONE SOD PHOS & ACET 6 (3-3) MG/ML IJ SUSP
12.0000 mg | Freq: Once | INTRAMUSCULAR | Status: AC
  Administered 2013-10-27: 12 mg via INTRAMUSCULAR
  Filled 2013-10-27: qty 2

## 2013-10-27 MED ORDER — ZOLPIDEM TARTRATE 5 MG PO TABS: 5.0000 mg | ORAL_TABLET | Freq: Every evening | ORAL | Status: DC | PRN

## 2013-10-27 MED ORDER — PROGESTERONE MICRONIZED 200 MG PO CAPS
200.0000 mg | ORAL_CAPSULE | Freq: Every day | ORAL | Status: DC
  Administered 2013-10-27 – 2013-10-28 (×2): 200 mg via VAGINAL
  Filled 2013-10-27 (×2): qty 1

## 2013-10-27 MED ORDER — DOCUSATE SODIUM 100 MG PO CAPS
100.0000 mg | ORAL_CAPSULE | Freq: Every day | ORAL | Status: DC
  Administered 2013-10-28: 100 mg via ORAL
  Filled 2013-10-27: qty 1

## 2013-10-27 MED ORDER — PRENATAL MULTIVITAMIN CH
1.0000 | ORAL_TABLET | Freq: Every day | ORAL | Status: DC
  Administered 2013-10-28: 1 via ORAL
  Filled 2013-10-27: qty 1

## 2013-10-27 NOTE — Progress Notes (Signed)
Pt to room 153 from U/S

## 2013-10-27 NOTE — H&P (Signed)
Krystal Zamora is a 33 y.o. female [phone] with IUP at [redacted]w[redacted]d presenting for postcoital bleeding, shortened cervix, elevated blood pressure. Pt states she has been havingnone contractions, associated with moderate vaginal bleeding.  Membranes are intact, with active fetal movement.   [facility]  9 wks   Prenatal History/Complications: Pt. With PMHx of Hep C presents after having postcoital moderate bleeding around 10:30pm 7/27. She reports that bleeding was moderate "all over the sheets", but resolved by morning after which time she noted only pink staining until 10 am after which there was no longer staining. She denies headache, visual changes, SOB, or abdominal pain. She was seen in clinic at which time she was found to have cervical exam of 1cm, 70%, -3 which was a change from U/S on 7/24 that had closed cervix. She also was found to have one elevated blood pressure 149/83 at clinic. Otherwise, she has no LOF. She has + FM. She has had two vaginal deliveries at term with her other pregnancies.    Past Medical History: Past Medical History  Diagnosis Date  . Hepatitis C virus 2004    s/p one month interferon treatment   . Hx of abnormal Pap smear 1997    s/p LEEP procedure.  . Hepatitis C   . Aspiration of vomit 2004  . 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 Aspiration biopsy      in ICU for aspiration of vomit for 7 weeks  . Liver biopsy  2009  . Wisdom tooth extraction      Obstetrical History: OB History   Grav Para Term Preterm Abortions TAB SAB Ect Mult Living   5 2 2  2     2       Gynecological History: OB History   Grav Para Term Preterm Abortions TAB SAB Ect Mult Living   5 2 2  2     2       Social History: History   Social History  . Marital Status: Married    Spouse Name: N/A    Number of Children: N/A  . Years of Education: N/A   Social History Main Topics   . Smoking status: Former Smoker    Quit date: 12/12/2005  . Smokeless tobacco: Never Used  . 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 Drug Use: No  . Sexual Activity: Yes    Birth Control/ Protection: None   Other Topics Concern  . None   Social History Narrative  . None    Family History: Family History  Problem Relation Age of Onset  . Adopted: Yes  . Other Neg Hx     Allergies: Allergies  Allergen Reactions  . Sulfa Antibiotics Hives    Prescriptions prior to admission  Medication Sig Dispense Refill  . acetaminophen (TYLENOL) 500 MG tablet Take 1,000 mg by mouth every 6 (six) hours as needed for moderate pain.      . calcium carbonate (TUMS - DOSED IN MG ELEMENTAL CALCIUM) 500 MG chewable tablet Chew 1 tablet by mouth daily.      . Prenatal Vit-Fe Fumarate-FA (PRENATAL MULTIVITAMIN) TABS tablet Take 1 tablet by mouth daily.         Review of Systems  Per HPI above.   Blood pressure 130/73, pulse 95, temperature 98.5 F (36.9 C), temperature  source Oral, resp. rate 18, height 5\' 7"  (1.702 m), weight 107.956 kg (238 lb), last menstrual period 04/06/2013. General appearance: alert, cooperative and no distress Lungs: clear to auscultation bilaterally Heart: regular rate and rhythm Abdomen: soft, non-tender; bowel sounds normal Pelvic: 1, 70, -2 Extremities: Homans sign is negative, no sign of DVT Grossly neurologically intact. 2+ DTR's  Presentation: cephalic Fetal monitoringBaseline: 135 bpm, Variability: Good {> 6 bpm) and Accelerations: Reactive Uterine activityNone     Prenatal labs: ABO, Rh: A/POS/-- (02/25 1556) Antibody: NEG (02/25 1556) Rubella:   RPR: NON REAC (07/14 1023)  HBsAg: NEGATIVE (02/25 1556)  HIV: NONREACTIVE (07/14 1023)  GBS:   None 1 hr Glucola -88 Genetic screening  Normal Anatomy US Fetal left renal pyelectasis 6.97mm at 28 wks., enlarged cisterna magna at 10mm at 28 wks.    Prenatal Transfer  Tool  Maternal Diabetes: No Genetic Screening: Normal Maternal Ultrasounds/Referrals: Fetal left renal pyelectasis 6.22mm at 28 wks., enlarged cisterna magna at 10mm at 28 wks. Fetal Ultrasounds or other Referrals:  None Maternal Substance Abuse:  No Significant Maternal Medications:  None Significant Maternal Lab Results: None     Results for orders placed during the hospital encounter of 10/27/13 (from the past 24 hour(s))  URINALYSIS, ROUTINE W REFLEX MICROSCOPIC   Collection Time    10/27/13  1:45 PM      Result Value Ref Range   Color, Urine YELLOW  YELLOW   APPearance CLEAR  CLEAR   Specific Gravity, Urine 1.020  1.005 - 1.030   pH 6.0  5.0 - 8.0   Glucose, UA NEGATIVE  NEGATIVE mg/dL   Hgb urine dipstick NEGATIVE  NEGATIVE   Bilirubin Urine NEGATIVE  NEGATIVE   Ketones, ur 15 (*) NEGATIVE mg/dL   Protein, ur NEGATIVE  NEGATIVE mg/dL   Urobilinogen, UA 0.2  0.0 - 1.0 mg/dL   Nitrite NEGATIVE  NEGATIVE   Leukocytes, UA NEGATIVE  NEGATIVE  PROTEIN / CREATININE RATIO, URINE   Collection Time    10/27/13  1:45 PM      Result Value Ref Range   Creatinine, Urine 83.11     Total Protein, Urine 7.7     PROTEIN CREATININE RATIO 0.09  0.00 - 0.15  COMPREHENSIVE METABOLIC PANEL   Collection Time    10/27/13  2:53 PM      Result Value Ref Range   Sodium 135 (*) 137 - 147 mEq/L   Potassium 3.6 (*) 3.7 - 5.3 mEq/L   Chloride 101  96 - 112 mEq/L   CO2 22  19 - 32 mEq/L   Glucose, Bld 94  70 - 99 mg/dL   BUN 9  6 - 23 mg/dL   Creatinine, Ser 1.61 (*) 0.50 - 1.10 mg/dL   Calcium 9.3  8.4 - 09.6 mg/dL   Total Protein 6.5  6.0 - 8.3 g/dL   Albumin 2.4 (*) 3.5 - 5.2 g/dL   AST 15  0 - 37 U/L   ALT 12  0 - 35 U/L   Alkaline Phosphatase 68  39 - 117 U/L   Total Bilirubin 0.3  0.3 - 1.2 mg/dL   GFR calc non Af Amer >90  >90 mL/min   GFR calc Af Amer >90  >90 mL/min   Anion gap 12  5 - 15  CBC WITH DIFFERENTIAL   Collection Time    10/27/13  2:53 PM      Result Value Ref  Range   WBC 10.5  4.0 -  10.5 K/uL   RBC 3.70 (*) 3.87 - 5.11 MIL/uL   Hemoglobin 12.3  12.0 - 15.0 g/dL   HCT 40.934.8 (*) 81.136.0 - 91.446.0 %   MCV 94.1  78.0 - 100.0 fL   MCH 33.2  26.0 - 34.0 pg   MCHC 35.3  30.0 - 36.0 g/dL   RDW 78.212.9  95.611.5 - 21.315.5 %   Platelets 220  150 - 400 K/uL   Neutrophils Relative % 62  43 - 77 %   Neutro Abs 6.5  1.7 - 7.7 K/uL   Lymphocytes Relative 27  12 - 46 %   Lymphs Abs 2.8  0.7 - 4.0 K/uL   Monocytes Relative 8  3 - 12 %   Monocytes Absolute 0.8  0.1 - 1.0 K/uL   Eosinophils Relative 3  0 - 5 %   Eosinophils Absolute 0.4  0.0 - 0.7 K/uL   Basophils Relative 0  0 - 1 %   Basophils Absolute 0.0  0.0 - 0.1 K/uL    Assessment: Krystal Zamora is a 33 y.o. [id number] at 1463w1d here for vaginal bleeding, shortened cervix, elevated blood pressure.  1. Vaginal Bleeding - resolved at this time - ultrasound pending - No abdominal pain - Reassuring FHT  2. Shortened Cervix - Admitted for observatin - Betamethasone x 2  - Prometrium - Will continue to monitor  3. Elevated blood pressure - Single isolated blood pressure - Blood pressures now 120's / 70's - PIH labs WNL - No neurological signs / symptoms.     Krystal Zamora, Hillery HunterCaleb G 10/27/2013, 8:42 PM

## 2013-10-27 NOTE — Patient Instructions (Signed)

## 2013-10-27 NOTE — MAU Note (Signed)
Pt states that during her office visit this morning her blood pressure was 149/92 and her cervix was dilated to 1cm and thinning. Pt states that she had vaginal bleeding last night that has since resolved.

## 2013-10-27 NOTE — Progress Notes (Signed)
Last night following intercourse patient had a significant amount of bleeding, it covered her and her partner and was all over the sheets.  This morning there is just a small amount of pink spotting but she is having cramping that started this morning. On exam, she is 1/70/-3; this is a change as she had an ultrasound on 10/23/13 showing a closed, 3.3 cm cervical length. No active bleeding seen, just scant brown discharge. Patient reports intermittent abdominal cramping. Furthermore, BP elevated today 149/93; denies HA, visual changes or epigastric pain.  Will send to MAU for evaluation for shortened cervix and Hypertension in pregnancy evaluation.  She will need to be started on Prometrium and may need BMZ.  Patient informed she may need prolonged evaluation/observation.  Preeclampsia, fetal movement and preterm labor precautions reviewed. Of note, 1 hr GTT 88, normal labs, Tdap to be given at next visit. 10/23/13 scan also showed left pyelectasis 6.4 mm, 10 mm enlarged cisterna magna, will reevaluate in 4 weeks.

## 2013-10-27 NOTE — MAU Note (Signed)
Urine in lab 

## 2013-10-28 DIAGNOSIS — O479 False labor, unspecified: Secondary | ICD-10-CM | POA: Diagnosis not present

## 2013-10-28 LAB — RAPID URINE DRUG SCREEN, HOSP PERFORMED
Amphetamines: NOT DETECTED
Barbiturates: NOT DETECTED
Benzodiazepines: NOT DETECTED
Cocaine: NOT DETECTED
Opiates: NOT DETECTED
Tetrahydrocannabinol: NOT DETECTED

## 2013-10-28 MED ORDER — BETAMETHASONE SOD PHOS & ACET 6 (3-3) MG/ML IJ SUSP
12.0000 mg | Freq: Once | INTRAMUSCULAR | Status: AC
  Administered 2013-10-28: 12 mg via INTRAMUSCULAR
  Filled 2013-10-28: qty 2

## 2013-10-28 MED ORDER — PROGESTERONE MICRONIZED 200 MG PO CAPS: 200.0000 mg | ORAL_CAPSULE | Freq: Every day | ORAL | Status: DC

## 2013-10-28 NOTE — Progress Notes (Signed)
Pt. Is stable and ready for discharge. All discharge instructions given to pt.Pt. Wheeled out via wheelchair to car with husband.

## 2013-10-28 NOTE — Progress Notes (Signed)
Ur chart review completed.  

## 2013-10-28 NOTE — Discharge Instructions (Signed)

## 2013-10-28 NOTE — Discharge Summary (Signed)
Physician Discharge Summary  Patient ID: Krystal Zamora MRN: [id number] DOB/AGE: 35/06/1980 35 y.o.  Admit date: 10/27/2013 Discharge date: 10/28/2013  Admission Diagnoses: bleeding and cramping at 29.[redacted] weeks EGA  Discharge Diagnoses: same Active Problems:   Threatened preterm labor   Discharged Condition: good  Hospital Course: She was observed in the hospital and given 2 doses of betamethasone for fetal lung maturity. Her cramping subsided and there were no contractions noted on the tocometer. She was started on vaginal prometrium for her short cervix and should continue this until [redacted] weeks EGA.  Consults: None  Significant Diagnostic Studies: ultrasound  Treatments: prometrium and betamethasone  Discharge Exam: Blood pressure 130/73, pulse 95, temperature 98.5 F (36.9 C), temperature source Oral, resp. rate 18, height 5\' 7"  (1.702 m), weight 107.956 kg (238 lb), last menstrual period 04/06/2013. General appearance: alert Cardio: regular rate and rhythm, S1, S2 normal, no murmur, click, rub or gallop GI: soft, non-tender; bowel sounds normal; no masses,  no organomegaly, benign, gravid  Disposition: 01-Home or Self Care     Medication List    ASK your doctor about these medications       acetaminophen 500 MG tablet  Commonly known as:  TYLENOL  Take 1,000 mg by mouth every 6 (six) hours as needed for moderate pain.     calcium carbonate 500 MG chewable tablet  Commonly known as:  TUMS - dosed in mg elemental calcium  Chew 1 tablet by mouth daily.     prenatal multivitamin Tabs tablet  Take 1 tablet by mouth daily.           Follow-up Information   [facility]. Schedule an appointment as soon as possible for a visit in 2 weeks.   Specialty:  Obstetrics and Gynecology   Contact information:   [address] [place] [id number] [phone]      Signed: Allie BossierDOVE,Caylee Vlachos C. 10/28/2013, 8:08 AM

## 2013-10-30 ENCOUNTER — Encounter: Payer: Medicaid Other | Admitting: Obstetrics & Gynecology

## 2013-11-02 ENCOUNTER — Ambulatory Visit (INDEPENDENT_AMBULATORY_CARE_PROVIDER_SITE_OTHER): Payer: BC Managed Care – PPO | Admitting: Family Medicine

## 2013-11-02 ENCOUNTER — Encounter: Payer: Self-pay | Admitting: Family Medicine

## 2013-11-02 VITALS — BP 104/94 | HR 87 | Wt 237.0 lb

## 2013-11-02 DIAGNOSIS — Z3480 Encounter for supervision of other normal pregnancy, unspecified trimester: Secondary | ICD-10-CM

## 2013-11-02 DIAGNOSIS — O4700 False labor before 37 completed weeks of gestation, unspecified trimester: Secondary | ICD-10-CM

## 2013-11-02 DIAGNOSIS — O4703 False labor before 37 completed weeks of gestation, third trimester: Secondary | ICD-10-CM

## 2013-11-02 DIAGNOSIS — Z3483 Encounter for supervision of other normal pregnancy, third trimester: Secondary | ICD-10-CM

## 2013-11-02 NOTE — Progress Notes (Signed)
No further bleeding On prometrium No evidence of bleeding on exam--Cervix is essentially unchanged.

## 2013-11-02 NOTE — Patient Instructions (Addendum)
Breastfeeding Deciding to breastfeed is one of the best choices you can make for you and your baby. A change in hormones during pregnancy causes your breast tissue to grow and increases the number and size of your milk ducts. These hormones also allow proteins, sugars, and fats from your blood supply to make breast milk in your milk-producing glands. Hormones prevent breast milk from being released before your baby is born as well as prompt milk flow after birth. Once breastfeeding has begun, thoughts of your baby, as well as his or her sucking or crying, can stimulate the release of milk from your milk-producing glands.  BENEFITS OF BREASTFEEDING For Your Baby  Your first milk (colostrum) helps your baby's digestive system function better.   There are antibodies in your milk that help your baby fight off infections.   Your baby has a lower incidence of asthma, allergies, and sudden infant death syndrome.   The nutrients in breast milk are better for your baby than infant formulas and are designed uniquely for your baby's needs.   Breast milk improves your baby's brain development.   Your baby is less likely to develop other conditions, such as childhood obesity, asthma, or type 2 diabetes mellitus.  For You   Breastfeeding helps to create a very special bond between you and your baby.   Breastfeeding is convenient. Breast milk is always available at the correct temperature and costs nothing.   Breastfeeding helps to burn calories and helps you lose the weight gained during pregnancy.   Breastfeeding makes your uterus contract to its prepregnancy size faster and slows bleeding (lochia) after you give birth.   Breastfeeding helps to lower your risk of developing type 2 diabetes mellitus, osteoporosis, and breast or ovarian cancer later in life. SIGNS THAT YOUR BABY IS HUNGRY Early Signs of Hunger  Increased alertness or activity.  Stretching.  Movement of the head from  side to side.  Movement of the head and opening of the mouth when the corner of the mouth or cheek is stroked (rooting).  Increased sucking sounds, smacking lips, cooing, sighing, or squeaking.  Hand-to-mouth movements.  Increased sucking of fingers or hands. Late Signs of Hunger  Fussing.  Intermittent crying. Extreme Signs of Hunger Signs of extreme hunger will require calming and consoling before your baby will be able to breastfeed successfully. Do not wait for the following signs of extreme hunger to occur before you initiate breastfeeding:   Restlessness.  A loud, strong cry.   Screaming. BREASTFEEDING BASICS Breastfeeding Initiation  Find a comfortable place to sit or lie down, with your neck and back well supported.  Place a pillow or rolled up blanket under your baby to bring him or her to the level of your breast (if you are seated). Nursing pillows are specially designed to help support your arms and your baby while you breastfeed.  Make sure that your baby's abdomen is facing your abdomen.   Gently massage your breast. With your fingertips, massage from your chest wall toward your nipple in a circular motion. This encourages milk flow. You may need to continue this action during the feeding if your milk flows slowly.  Support your breast with 4 fingers underneath and your thumb above your nipple. Make sure your fingers are well away from your nipple and your baby's mouth.   Stroke your baby's lips gently with your finger or nipple.   When your baby's mouth is open wide enough, quickly bring your baby to your   breast, placing your entire nipple and as much of the colored area around your nipple (areola) as possible into your baby's mouth.   More areola should be visible above your baby's upper lip than below the lower lip.   Your baby's tongue should be between his or her lower gum and your breast.   Ensure that your baby's mouth is correctly positioned  around your nipple (latched). Your baby's lips should create a seal on your breast and be turned out (everted).  It is common for your baby to suck about 2-3 minutes in order to start the flow of breast milk. Latching Teaching your baby how to latch on to your breast properly is very important. An improper latch can cause nipple pain and decreased milk supply for you and poor weight gain in your baby. Also, if your baby is not latched onto your nipple properly, he or she may swallow some air during feeding. This can make your baby fussy. Burping your baby when you switch breasts during the feeding can help to get rid of the air. However, teaching your baby to latch on properly is still the best way to prevent fussiness from swallowing air while breastfeeding. Signs that your baby has successfully latched on to your nipple:    Silent tugging or silent sucking, without causing you pain.   Swallowing heard between every 3-4 sucks.    Muscle movement above and in front of his or her ears while sucking.  Signs that your baby has not successfully latched on to nipple:   Sucking sounds or smacking sounds from your baby while breastfeeding.  Nipple pain. If you think your baby has not latched on correctly, slip your finger into the corner of your baby's mouth to break the suction and place it between your baby's gums. Attempt breastfeeding initiation again. Signs of Successful Breastfeeding Signs from your baby:   A gradual decrease in the number of sucks or complete cessation of sucking.   Falling asleep.   Relaxation of his or her body.   Retention of a small amount of milk in his or her mouth.   Letting go of your breast by himself or herself. Signs from you:  Breasts that have increased in firmness, weight, and size 1-3 hours after feeding.   Breasts that are softer immediately after breastfeeding.  Increased milk volume, as well as a change in milk consistency and color by  the fifth day of breastfeeding.   Nipples that are not sore, cracked, or bleeding. Signs That Your Baby is Getting Enough Milk  Wetting at least 3 diapers in a 24-hour period. The urine should be clear and pale yellow by age 5 days.  At least 3 stools in a 24-hour period by age 5 days. The stool should be soft and yellow.  At least 3 stools in a 24-hour period by age 7 days. The stool should be seedy and yellow.  No loss of weight greater than 10% of birth weight during the first 3 days of age.  Average weight gain of 4-7 ounces (113-198 g) per week after age 4 days.  Consistent daily weight gain by age 5 days, without weight loss after the age of 2 weeks. After a feeding, your baby may spit up a small amount. This is common. BREASTFEEDING FREQUENCY AND DURATION Frequent feeding will help you make more milk and can prevent sore nipples and breast engorgement. Breastfeed when you feel the need to reduce the fullness of your breasts   or when your baby shows signs of hunger. This is called "breastfeeding on demand." Avoid introducing a pacifier to your baby while you are working to establish breastfeeding (the first 4-6 weeks after your baby is born). After this time you may choose to use a pacifier. Research has shown that pacifier use during the first year of a baby's life decreases the risk of sudden infant death syndrome (SIDS). Allow your baby to feed on each breast as long as he or she wants. Breastfeed until your baby is finished feeding. When your baby unlatches or falls asleep while feeding from the first breast, offer the second breast. Because newborns are often sleepy in the first few weeks of life, you may need to awaken your baby to get him or her to feed. Breastfeeding times will vary from baby to baby. However, the following rules can serve as a guide to help you ensure that your baby is properly fed:  Newborns (babies 4 weeks of age or younger) may breastfeed every 1-3  hours.  Newborns should not go longer than 3 hours during the day or 5 hours during the night without breastfeeding.  You should breastfeed your baby a minimum of 8 times in a 24-hour period until you begin to introduce solid foods to your baby at around 6 months of age. BREAST MILK PUMPING Pumping and storing breast milk allows you to ensure that your baby is exclusively fed your breast milk, even at times when you are unable to breastfeed. This is especially important if you are going back to work while you are still breastfeeding or when you are not able to be present during feedings. Your lactation consultant can give you guidelines on how long it is safe to store breast milk.  A breast pump is a machine that allows you to pump milk from your breast into a sterile bottle. The pumped breast milk can then be stored in a refrigerator or freezer. Some breast pumps are operated by hand, while others use electricity. Ask your lactation consultant which type will work best for you. Breast pumps can be purchased, but some hospitals and breastfeeding support groups lease breast pumps on a monthly basis. A lactation consultant can teach you how to hand express breast milk, if you prefer not to use a pump.  CARING FOR YOUR BREASTS WHILE YOU BREASTFEED Nipples can become dry, cracked, and sore while breastfeeding. The following recommendations can help keep your breasts moisturized and healthy:  Avoid using soap on your nipples.   Wear a supportive bra. Although not required, special nursing bras and tank tops are designed to allow access to your breasts for breastfeeding without taking off your entire bra or top. Avoid wearing underwire-style bras or extremely tight bras.  Air dry your nipples for 3-4minutes after each feeding.   Use only cotton bra pads to absorb leaked breast milk. Leaking of breast milk between feedings is normal.   Use lanolin on your nipples after breastfeeding. Lanolin helps to  maintain your skin's normal moisture barrier. If you use pure lanolin, you do not need to wash it off before feeding your baby again. Pure lanolin is not toxic to your baby. You may also hand express a few drops of breast milk and gently massage that milk into your nipples and allow the milk to air dry. In the first few weeks after giving birth, some women experience extremely full breasts (engorgement). Engorgement can make your breasts feel heavy, warm, and tender to the   touch. Engorgement peaks within 3-5 days after you give birth. The following recommendations can help ease engorgement:  Completely empty your breasts while breastfeeding or pumping. You may want to start by applying warm, moist heat (in the shower or with warm water-soaked hand towels) just before feeding or pumping. This increases circulation and helps the milk flow. If your baby does not completely empty your breasts while breastfeeding, pump any extra milk after he or she is finished.  Wear a snug bra (nursing or regular) or tank top for 1-2 days to signal your body to slightly decrease milk production.  Apply ice packs to your breasts, unless this is too uncomfortable for you.  Make sure that your baby is latched on and positioned properly while breastfeeding. If engorgement persists after 48 hours of following these recommendations, contact your health care provider or a lactation consultant. OVERALL HEALTH CARE RECOMMENDATIONS WHILE BREASTFEEDING  Eat healthy foods. Alternate between meals and snacks, eating 3 of each per day. Because what you eat affects your breast milk, some of the foods may make your baby more irritable than usual. Avoid eating these foods if you are sure that they are negatively affecting your baby.  Drink milk, fruit juice, and water to satisfy your thirst (about 10 glasses a day).   Rest often, relax, and continue to take your prenatal vitamins to prevent fatigue, stress, and anemia.  Continue  breast self-awareness checks.  Avoid chewing and smoking tobacco.  Avoid alcohol and drug use. Some medicines that may be harmful to your baby can pass through breast milk. It is important to ask your health care provider before taking any medicine, including all over-the-counter and prescription medicine as well as vitamin and herbal supplements. It is possible to become pregnant while breastfeeding. If birth control is desired, ask your health care provider about options that will be safe for your baby. SEEK MEDICAL CARE IF:   You feel like you want to stop breastfeeding or have become frustrated with breastfeeding.  You have painful breasts or nipples.  Your nipples are cracked or bleeding.  Your breasts are red, tender, or warm.  You have a swollen area on either breast.  You have a fever or chills.  You have nausea or vomiting.  You have drainage other than breast milk from your nipples.  Your breasts do not become full before feedings by the fifth day after you give birth.  You feel sad and depressed.  Your baby is too sleepy to eat well.  Your baby is having trouble sleeping.   Your baby is wetting less than 3 diapers in a 24-hour period.  Your baby has less than 3 stools in a 24-hour period.  Your baby's skin or the white part of his or her eyes becomes yellow.   Your baby is not gaining weight by 5 days of age. SEEK IMMEDIATE MEDICAL CARE IF:   Your baby is overly tired (lethargic) and does not want to wake up and feed.  Your baby develops an unexplained fever. Document Released: 03/19/2005 Document Revised: 03/24/2013 Document Reviewed: 09/10/2012 [employer] Patient Information 2015 [employer]. This information is not intended to replace advice given to you by your health care provider. Make sure you discuss any questions you have with your health care provider.  Preterm Labor Information Preterm labor is when labor starts at less than 37 weeks of  pregnancy. The normal length of a pregnancy is 39 to 41 weeks. CAUSES Often, there is no identifiable   underlying cause as to why a woman goes into preterm labor. One of the most common known causes of preterm labor is infection. Infections of the uterus, cervix, vagina, amniotic sac, bladder, kidney, or even the lungs (pneumonia) can cause labor to start. Other suspected causes of preterm labor include:   Urogenital infections, such as yeast infections and bacterial vaginosis.   Uterine abnormalities (uterine shape, uterine septum, fibroids, or bleeding from the placenta).   A cervix that has been operated on (it may fail to stay closed).   Malformations in the fetus.   Multiple gestations (twins, triplets, and so on).   Breakage of the amniotic sac.  RISK FACTORS  Having a previous history of preterm labor.   Having premature rupture of membranes (PROM).   Having a placenta that covers the opening of the cervix (placenta previa).   Having a placenta that separates from the uterus (placental abruption).   Having a cervix that is too weak to hold the fetus in the uterus (incompetent cervix).   Having too much fluid in the amniotic sac (polyhydramnios).   Taking illegal drugs or smoking while pregnant.   Not gaining enough weight while pregnant.   Being younger than 18 and older than 35 years old.   Having a low socioeconomic status.   Being African American. SYMPTOMS Signs and symptoms of preterm labor include:   Menstrual-like cramps, abdominal pain, or back pain.  Uterine contractions that are regular, as frequent as six in an hour, regardless of their intensity (may be mild or painful).  Contractions that start on the top of the uterus and spread down to the lower abdomen and back.   A sense of increased pelvic pressure.   A watery or bloody mucus discharge that comes from the vagina.  TREATMENT Depending on the length of the pregnancy and other  circumstances, your health care provider may suggest bed rest. If necessary, there are medicines that can be given to stop contractions and to mature the fetal lungs. If labor happens before 34 weeks of pregnancy, a prolonged hospital stay may be recommended. Treatment depends on the condition of both you and the fetus.  WHAT SHOULD YOU DO IF YOU THINK YOU ARE IN PRETERM LABOR? Call your health care provider right away. You will need to go to the hospital to get checked immediately. HOW CAN YOU PREVENT PRETERM LABOR IN FUTURE PREGNANCIES? You should:   Stop smoking if you smoke.  Maintain healthy weight gain and avoid chemicals and drugs that are not necessary.  Be watchful for any type of infection.  Inform your health care provider if you have a known history of preterm labor. Document Released: 06/09/2003 Document Revised: 11/19/2012 Document Reviewed: 04/21/2012 [employer] Patient Information 2015 [employer]. This information is not intended to replace advice given to you by your health care provider. Make sure you discuss any questions you have with your health care provider.  

## 2013-11-03 ENCOUNTER — Encounter: Payer: BC Managed Care – PPO | Admitting: Family Medicine

## 2013-11-06 ENCOUNTER — Encounter: Payer: BC Managed Care – PPO | Admitting: Obstetrics and Gynecology

## 2013-11-16 ENCOUNTER — Ambulatory Visit (INDEPENDENT_AMBULATORY_CARE_PROVIDER_SITE_OTHER): Payer: BC Managed Care – PPO | Admitting: Family Medicine

## 2013-11-16 ENCOUNTER — Encounter: Payer: Self-pay | Admitting: Family Medicine

## 2013-11-16 VITALS — BP 109/84 | HR 91 | Wt 247.0 lb

## 2013-11-16 DIAGNOSIS — Z3480 Encounter for supervision of other normal pregnancy, unspecified trimester: Secondary | ICD-10-CM

## 2013-11-16 DIAGNOSIS — O09523 Supervision of elderly multigravida, third trimester: Secondary | ICD-10-CM

## 2013-11-16 DIAGNOSIS — Z23 Encounter for immunization: Secondary | ICD-10-CM

## 2013-11-16 DIAGNOSIS — Z3483 Encounter for supervision of other normal pregnancy, third trimester: Secondary | ICD-10-CM

## 2013-11-16 DIAGNOSIS — O09529 Supervision of elderly multigravida, unspecified trimester: Secondary | ICD-10-CM

## 2013-11-16 DIAGNOSIS — O4700 False labor before 37 completed weeks of gestation, unspecified trimester: Secondary | ICD-10-CM

## 2013-11-16 DIAGNOSIS — O4703 False labor before 37 completed weeks of gestation, third trimester: Secondary | ICD-10-CM

## 2013-11-16 NOTE — Progress Notes (Signed)
Having emotional outbursts at work She continues to have contractions-which improve with rest-will take her out of work to improve her ability to rest

## 2013-11-16 NOTE — Patient Instructions (Signed)
Breastfeeding Deciding to breastfeed is one of the best choices you can make for you and your baby. A change in hormones during pregnancy causes your breast tissue to grow and increases the number and size of your milk ducts. These hormones also allow proteins, sugars, and fats from your blood supply to make breast milk in your milk-producing glands. Hormones prevent breast milk from being released before your baby is born as well as prompt milk flow after birth. Once breastfeeding has begun, thoughts of your baby, as well as his or her sucking or crying, can stimulate the release of milk from your milk-producing glands.  BENEFITS OF BREASTFEEDING For Your Baby  Your first milk (colostrum) helps your baby's digestive system function better.   There are antibodies in your milk that help your baby fight off infections.   Your baby has a lower incidence of asthma, allergies, and sudden infant death syndrome.   The nutrients in breast milk are better for your baby than infant formulas and are designed uniquely for your baby's needs.   Breast milk improves your baby's brain development.   Your baby is less likely to develop other conditions, such as childhood obesity, asthma, or type 2 diabetes mellitus.  For You   Breastfeeding helps to create a very special bond between you and your baby.   Breastfeeding is convenient. Breast milk is always available at the correct temperature and costs nothing.   Breastfeeding helps to burn calories and helps you lose the weight gained during pregnancy.   Breastfeeding makes your uterus contract to its prepregnancy size faster and slows bleeding (lochia) after you give birth.   Breastfeeding helps to lower your risk of developing type 2 diabetes mellitus, osteoporosis, and breast or ovarian cancer later in life. SIGNS THAT YOUR BABY IS HUNGRY Early Signs of Hunger  Increased alertness or activity.  Stretching.  Movement of the head from  side to side.  Movement of the head and opening of the mouth when the corner of the mouth or cheek is stroked (rooting).  Increased sucking sounds, smacking lips, cooing, sighing, or squeaking.  Hand-to-mouth movements.  Increased sucking of fingers or hands. Late Signs of Hunger  Fussing.  Intermittent crying. Extreme Signs of Hunger Signs of extreme hunger will require calming and consoling before your baby will be able to breastfeed successfully. Do not wait for the following signs of extreme hunger to occur before you initiate breastfeeding:   Restlessness.  A loud, strong cry.   Screaming. BREASTFEEDING BASICS Breastfeeding Initiation  Find a comfortable place to sit or lie down, with your neck and back well supported.  Place a pillow or rolled up blanket under your baby to bring him or her to the level of your breast (if you are seated). Nursing pillows are specially designed to help support your arms and your baby while you breastfeed.  Make sure that your baby's abdomen is facing your abdomen.   Gently massage your breast. With your fingertips, massage from your chest wall toward your nipple in a circular motion. This encourages milk flow. You may need to continue this action during the feeding if your milk flows slowly.  Support your breast with 4 fingers underneath and your thumb above your nipple. Make sure your fingers are well away from your nipple and your baby's mouth.   Stroke your baby's lips gently with your finger or nipple.   When your baby's mouth is open wide enough, quickly bring your baby to your   breast, placing your entire nipple and as much of the colored area around your nipple (areola) as possible into your baby's mouth.   More areola should be visible above your baby's upper lip than below the lower lip.   Your baby's tongue should be between his or her lower gum and your breast.   Ensure that your baby's mouth is correctly positioned  around your nipple (latched). Your baby's lips should create a seal on your breast and be turned out (everted).  It is common for your baby to suck about 2-3 minutes in order to start the flow of breast milk. Latching Teaching your baby how to latch on to your breast properly is very important. An improper latch can cause nipple pain and decreased milk supply for you and poor weight gain in your baby. Also, if your baby is not latched onto your nipple properly, he or she may swallow some air during feeding. This can make your baby fussy. Burping your baby when you switch breasts during the feeding can help to get rid of the air. However, teaching your baby to latch on properly is still the best way to prevent fussiness from swallowing air while breastfeeding. Signs that your baby has successfully latched on to your nipple:    Silent tugging or silent sucking, without causing you pain.   Swallowing heard between every 3-4 sucks.    Muscle movement above and in front of his or her ears while sucking.  Signs that your baby has not successfully latched on to nipple:   Sucking sounds or smacking sounds from your baby while breastfeeding.  Nipple pain. If you think your baby has not latched on correctly, slip your finger into the corner of your baby's mouth to break the suction and place it between your baby's gums. Attempt breastfeeding initiation again. Signs of Successful Breastfeeding Signs from your baby:   A gradual decrease in the number of sucks or complete cessation of sucking.   Falling asleep.   Relaxation of his or her body.   Retention of a small amount of milk in his or her mouth.   Letting go of your breast by himself or herself. Signs from you:  Breasts that have increased in firmness, weight, and size 1-3 hours after feeding.   Breasts that are softer immediately after breastfeeding.  Increased milk volume, as well as a change in milk consistency and color by  the fifth day of breastfeeding.   Nipples that are not sore, cracked, or bleeding. Signs That Your Baby is Getting Enough Milk  Wetting at least 3 diapers in a 24-hour period. The urine should be clear and pale yellow by age 5 days.  At least 3 stools in a 24-hour period by age 5 days. The stool should be soft and yellow.  At least 3 stools in a 24-hour period by age 7 days. The stool should be seedy and yellow.  No loss of weight greater than 10% of birth weight during the first 3 days of age.  Average weight gain of 4-7 ounces (113-198 g) per week after age 4 days.  Consistent daily weight gain by age 5 days, without weight loss after the age of 2 weeks. After a feeding, your baby may spit up a small amount. This is common. BREASTFEEDING FREQUENCY AND DURATION Frequent feeding will help you make more milk and can prevent sore nipples and breast engorgement. Breastfeed when you feel the need to reduce the fullness of your breasts   or when your baby shows signs of hunger. This is called "breastfeeding on demand." Avoid introducing a pacifier to your baby while you are working to establish breastfeeding (the first 4-6 weeks after your baby is born). After this time you may choose to use a pacifier. Research has shown that pacifier use during the first year of a baby's life decreases the risk of sudden infant death syndrome (SIDS). Allow your baby to feed on each breast as long as he or she wants. Breastfeed until your baby is finished feeding. When your baby unlatches or falls asleep while feeding from the first breast, offer the second breast. Because newborns are often sleepy in the first few weeks of life, you may need to awaken your baby to get him or her to feed. Breastfeeding times will vary from baby to baby. However, the following rules can serve as a guide to help you ensure that your baby is properly fed:  Newborns (babies 4 weeks of age or younger) may breastfeed every 1-3  hours.  Newborns should not go longer than 3 hours during the day or 5 hours during the night without breastfeeding.  You should breastfeed your baby a minimum of 8 times in a 24-hour period until you begin to introduce solid foods to your baby at around 6 months of age. BREAST MILK PUMPING Pumping and storing breast milk allows you to ensure that your baby is exclusively fed your breast milk, even at times when you are unable to breastfeed. This is especially important if you are going back to work while you are still breastfeeding or when you are not able to be present during feedings. Your lactation consultant can give you guidelines on how long it is safe to store breast milk.  A breast pump is a machine that allows you to pump milk from your breast into a sterile bottle. The pumped breast milk can then be stored in a refrigerator or freezer. Some breast pumps are operated by hand, while others use electricity. Ask your lactation consultant which type will work best for you. Breast pumps can be purchased, but some hospitals and breastfeeding support groups lease breast pumps on a monthly basis. A lactation consultant can teach you how to hand express breast milk, if you prefer not to use a pump.  CARING FOR YOUR BREASTS WHILE YOU BREASTFEED Nipples can become dry, cracked, and sore while breastfeeding. The following recommendations can help keep your breasts moisturized and healthy:  Avoid using soap on your nipples.   Wear a supportive bra. Although not required, special nursing bras and tank tops are designed to allow access to your breasts for breastfeeding without taking off your entire bra or top. Avoid wearing underwire-style bras or extremely tight bras.  Air dry your nipples for 3-4minutes after each feeding.   Use only cotton bra pads to absorb leaked breast milk. Leaking of breast milk between feedings is normal.   Use lanolin on your nipples after breastfeeding. Lanolin helps to  maintain your skin's normal moisture barrier. If you use pure lanolin, you do not need to wash it off before feeding your baby again. Pure lanolin is not toxic to your baby. You may also hand express a few drops of breast milk and gently massage that milk into your nipples and allow the milk to air dry. In the first few weeks after giving birth, some women experience extremely full breasts (engorgement). Engorgement can make your breasts feel heavy, warm, and tender to the   touch. Engorgement peaks within 3-5 days after you give birth. The following recommendations can help ease engorgement:  Completely empty your breasts while breastfeeding or pumping. You may want to start by applying warm, moist heat (in the shower or with warm water-soaked hand towels) just before feeding or pumping. This increases circulation and helps the milk flow. If your baby does not completely empty your breasts while breastfeeding, pump any extra milk after he or she is finished.  Wear a snug bra (nursing or regular) or tank top for 1-2 days to signal your body to slightly decrease milk production.  Apply ice packs to your breasts, unless this is too uncomfortable for you.  Make sure that your baby is latched on and positioned properly while breastfeeding. If engorgement persists after 48 hours of following these recommendations, contact your health care provider or a lactation consultant. OVERALL HEALTH CARE RECOMMENDATIONS WHILE BREASTFEEDING  Eat healthy foods. Alternate between meals and snacks, eating 3 of each per day. Because what you eat affects your breast milk, some of the foods may make your baby more irritable than usual. Avoid eating these foods if you are sure that they are negatively affecting your baby.  Drink milk, fruit juice, and water to satisfy your thirst (about 10 glasses a day).   Rest often, relax, and continue to take your prenatal vitamins to prevent fatigue, stress, and anemia.  Continue  breast self-awareness checks.  Avoid chewing and smoking tobacco.  Avoid alcohol and drug use. Some medicines that may be harmful to your baby can pass through breast milk. It is important to ask your health care provider before taking any medicine, including all over-the-counter and prescription medicine as well as vitamin and herbal supplements. It is possible to become pregnant while breastfeeding. If birth control is desired, ask your health care provider about options that will be safe for your baby. SEEK MEDICAL CARE IF:   You feel like you want to stop breastfeeding or have become frustrated with breastfeeding.  You have painful breasts or nipples.  Your nipples are cracked or bleeding.  Your breasts are red, tender, or warm.  You have a swollen area on either breast.  You have a fever or chills.  You have nausea or vomiting.  You have drainage other than breast milk from your nipples.  Your breasts do not become full before feedings by the fifth day after you give birth.  You feel sad and depressed.  Your baby is too sleepy to eat well.  Your baby is having trouble sleeping.   Your baby is wetting less than 3 diapers in a 24-hour period.  Your baby has less than 3 stools in a 24-hour period.  Your baby's skin or the white part of his or her eyes becomes yellow.   Your baby is not gaining weight by 5 days of age. SEEK IMMEDIATE MEDICAL CARE IF:   Your baby is overly tired (lethargic) and does not want to wake up and feed.  Your baby develops an unexplained fever. Document Released: 03/19/2005 Document Revised: 03/24/2013 Document Reviewed: 09/10/2012 [employer] Patient Information 2015 [employer]. This information is not intended to replace advice given to you by your health care provider. Make sure you discuss any questions you have with your health care provider.  

## 2013-11-20 ENCOUNTER — Ambulatory Visit (HOSPITAL_COMMUNITY)
Admission: RE | Admit: 2013-11-20 | Discharge: 2013-11-20 | Disposition: A | Discharge status: Home / Self Care | Payer: BC Managed Care – PPO | Source: Ambulatory Visit | Attending: Family Medicine | Admitting: Family Medicine

## 2013-11-20 ENCOUNTER — Encounter (HOSPITAL_COMMUNITY): Payer: Self-pay

## 2013-11-20 VITALS — BP 137/83 | HR 106 | Wt 244.0 lb

## 2013-11-20 DIAGNOSIS — Z3689 Encounter for other specified antenatal screening: Secondary | ICD-10-CM | POA: Diagnosis not present

## 2013-11-20 DIAGNOSIS — O283 Abnormal ultrasonic finding on antenatal screening of mother: Secondary | ICD-10-CM

## 2013-11-20 DIAGNOSIS — O09529 Supervision of elderly multigravida, unspecified trimester: Secondary | ICD-10-CM | POA: Diagnosis not present

## 2013-11-20 DIAGNOSIS — O358XX0 Maternal care for other (suspected) fetal abnormality and damage, not applicable or unspecified: Secondary | ICD-10-CM | POA: Diagnosis present

## 2013-11-20 DIAGNOSIS — IMO0001 Reserved for inherently not codable concepts without codable children: Secondary | ICD-10-CM

## 2013-11-23 NOTE — H&P (Signed)
Attestation of Attending Supervision of Advanced Practitioner (CNM/NP): Evaluation and management procedures were performed by the Advanced Practitioner under my supervision and collaboration. I have reviewed the Advanced Practitioner's note and chart, and I agree with the management and plan.  Bard Haupert H. 10:23 AM

## 2013-11-27 ENCOUNTER — Encounter: Payer: Self-pay | Admitting: Family Medicine

## 2013-11-27 ENCOUNTER — Ambulatory Visit (INDEPENDENT_AMBULATORY_CARE_PROVIDER_SITE_OTHER): Payer: BC Managed Care – PPO | Admitting: Family Medicine

## 2013-11-27 VITALS — BP 118/86 | HR 95 | Wt 247.4 lb

## 2013-11-27 DIAGNOSIS — Z3483 Encounter for supervision of other normal pregnancy, third trimester: Secondary | ICD-10-CM

## 2013-11-27 DIAGNOSIS — Z3480 Encounter for supervision of other normal pregnancy, unspecified trimester: Secondary | ICD-10-CM

## 2013-11-27 NOTE — Progress Notes (Signed)
Had u/s last week Baby was 83% 5 lb 5 oz--has h/o 9 lb 6 oz baby in the past Still with mildly enlarged cysterna magna--of ? Clinical significance.

## 2013-11-27 NOTE — Patient Instructions (Addendum)
[facility]  Address: [address] [place], [place] [address]  [phone]  Hours:  Open today  8:00 am - 5:00 pm  Third Trimester of Pregnancy The third trimester is from week 29 through week 42, months 7 through 9. The third trimester is a time when the fetus is growing rapidly. At the end of the ninth month, the fetus is about 20 inches in length and weighs 6-10 pounds.  BODY CHANGES Your body goes through many changes during pregnancy. The changes vary from woman to woman.   Your weight will continue to increase. You can expect to gain 25-35 pounds (11-16 kg) by the end of the pregnancy.  You may begin to get stretch marks on your hips, abdomen, and breasts.  You may urinate more often because the fetus is moving lower into your pelvis and pressing on your bladder.  You may develop or continue to have heartburn as a result of your pregnancy.  You may develop constipation because certain hormones are causing the muscles that push waste through your intestines to slow down.  You may develop hemorrhoids or swollen, bulging veins (varicose veins).  You may have pelvic pain because of the weight gain and pregnancy hormones relaxing your joints between the bones in your pelvis. Backaches may result from overexertion of the muscles supporting your posture.  You may have changes in your hair. These can include thickening of your hair, rapid growth, and changes in texture. Some women also have hair loss during or after pregnancy, or hair that feels dry or thin. Your hair will most likely return to normal after your baby is born.  Your breasts will continue to grow and be tender. A yellow discharge may leak from your breasts called colostrum.  Your belly button may stick out.  You may feel short of breath because of your expanding uterus.  You may notice the fetus "dropping," or moving lower in your abdomen.  You may have a bloody mucus discharge. This usually  occurs a few days to a week before labor begins.  Your cervix becomes thin and soft (effaced) near your due date. WHAT TO EXPECT AT YOUR PRENATAL EXAMS  You will have prenatal exams every 2 weeks until week 36. Then, you will have weekly prenatal exams. During a routine prenatal visit:  You will be weighed to make sure you and the fetus are growing normally.  Your blood pressure is taken.  Your abdomen will be measured to track your baby's growth.  The fetal heartbeat will be listened to.  Any test results from the previous visit will be discussed.  You may have a cervical check near your due date to see if you have effaced. At around 36 weeks, your caregiver will check your cervix. At the same time, your caregiver will also perform a test on the secretions of the vaginal tissue. This test is to determine if a type of bacteria, Group B streptococcus, is present. Your caregiver will explain this further. Your caregiver may ask you:  What your birth plan is.  How you are feeling.  If you are feeling the baby move.  If you have had any abnormal symptoms, such as leaking fluid, bleeding, severe headaches, or abdominal cramping.  If you have any questions. Other tests or screenings that may be performed during your third trimester include:  Blood tests that check for low iron levels (anemia).  Fetal testing to check the health, activity level, and growth of the  fetus. Testing is done if you have certain medical conditions or if there are problems during the pregnancy. FALSE LABOR You may feel small, irregular contractions that eventually go away. These are called Braxton Hicks contractions, or false labor. Contractions may last for hours, days, or even weeks before true labor sets in. If contractions come at regular intervals, intensify, or become painful, it is best to be seen by your caregiver.  SIGNS OF LABOR   Menstrual-like cramps.  Contractions that are 5 minutes apart or  less.  Contractions that start on the top of the uterus and spread down to the lower abdomen and back.  A sense of increased pelvic pressure or back pain.  A watery or bloody mucus discharge that comes from the vagina. If you have any of these signs before the 37th week of pregnancy, call your caregiver right away. You need to go to the hospital to get checked immediately. HOME CARE INSTRUCTIONS   Avoid all smoking, herbs, alcohol, and unprescribed drugs. These chemicals affect the formation and growth of the baby.  Follow your caregiver's instructions regarding medicine use. There are medicines that are either safe or unsafe to take during pregnancy.  Exercise only as directed by your caregiver. Experiencing uterine cramps is a good sign to stop exercising.  Continue to eat regular, healthy meals.  Wear a good support bra for breast tenderness.  Do not use hot tubs, steam rooms, or saunas.  Wear your seat belt at all times when driving.  Avoid raw meat, uncooked cheese, cat litter boxes, and soil used by cats. These carry germs that can cause birth defects in the baby.  Take your prenatal vitamins.  Try taking a stool softener (if your caregiver approves) if you develop constipation. Eat more high-fiber foods, such as fresh vegetables or fruit and whole grains. Drink plenty of fluids to keep your urine clear or pale yellow.  Take warm sitz baths to soothe any pain or discomfort caused by hemorrhoids. Use hemorrhoid cream if your caregiver approves.  If you develop varicose veins, wear support hose. Elevate your feet for 15 minutes, 3-4 times a day. Limit salt in your diet.  Avoid heavy lifting, wear low heal shoes, and practice good posture.  Rest a lot with your legs elevated if you have leg cramps or low back pain.  Visit your dentist if you have not gone during your pregnancy. Use a soft toothbrush to brush your teeth and be gentle when you floss.  A sexual relationship  may be continued unless your caregiver directs you otherwise.  Do not travel far distances unless it is absolutely necessary and only with the approval of your caregiver.  Take prenatal classes to understand, practice, and ask questions about the labor and delivery.  Make a trial run to the hospital.  Pack your hospital bag.  Prepare the baby's nursery.  Continue to go to all your prenatal visits as directed by your caregiver. SEEK MEDICAL CARE IF:  You are unsure if you are in labor or if your water has broken.  You have dizziness.  You have mild pelvic cramps, pelvic pressure, or nagging pain in your abdominal area.  You have persistent nausea, vomiting, or diarrhea.  You have a bad smelling vaginal discharge.  You have pain with urination. SEEK IMMEDIATE MEDICAL CARE IF:   You have a fever.  You are leaking fluid from your vagina.  You have spotting or bleeding from your vagina.  You have severe abdominal   cramping or pain.  You have rapid weight loss or gain.  You have shortness of breath with chest pain.  You notice sudden or extreme swelling of your face, hands, ankles, feet, or legs.  You have not felt your baby move in over an hour.  You have severe headaches that do not go away with medicine.  You have vision changes. Document Released: 03/13/2001 Document Revised: 03/24/2013 Document Reviewed: 05/20/2012 [facility] Patient Information 2015 [place], [place]. This information is not intended to replace advice given to you by your health care provider. Make sure you discuss any questions you have with your health care provider.  Breastfeeding Deciding to breastfeed is one of the best choices you can make for you and your baby. A change in hormones during pregnancy causes your breast tissue to grow and increases the number and size of your milk ducts. These hormones also allow proteins, sugars, and fats from your blood supply to make breast milk in your  milk-producing glands. Hormones prevent breast milk from being released before your baby is born as well as prompt milk flow after birth. Once breastfeeding has begun, thoughts of your baby, as well as his or her sucking or crying, can stimulate the release of milk from your milk-producing glands.  BENEFITS OF BREASTFEEDING For Your Baby  Your first milk (colostrum) helps your baby's digestive system function better.   There are antibodies in your milk that help your baby fight off infections.   Your baby has a lower incidence of asthma, allergies, and sudden infant death syndrome.   The nutrients in breast milk are better for your baby than infant formulas and are designed uniquely for your baby's needs.   Breast milk improves your baby's brain development.   Your baby is less likely to develop other conditions, such as childhood obesity, asthma, or type 2 diabetes mellitus.  For You   Breastfeeding helps to create a very special bond between you and your baby.   Breastfeeding is convenient. Breast milk is always available at the correct temperature and costs nothing.   Breastfeeding helps to burn calories and helps you lose the weight gained during pregnancy.   Breastfeeding makes your uterus contract to its prepregnancy size faster and slows bleeding (lochia) after you give birth.   Breastfeeding helps to lower your risk of developing type 2 diabetes mellitus, osteoporosis, and breast or ovarian cancer later in life. SIGNS THAT YOUR BABY IS HUNGRY Early Signs of Hunger  Increased alertness or activity.  Stretching.  Movement of the head from side to side.  Movement of the head and opening of the mouth when the corner of the mouth or cheek is stroked (rooting).  Increased sucking sounds, smacking lips, cooing, sighing, or squeaking.  Hand-to-mouth movements.  Increased sucking of fingers or hands. Late Signs of Hunger  Fussing.  Intermittent crying. Extreme  Signs of Hunger Signs of extreme hunger will require calming and consoling before your baby will be able to breastfeed successfully. Do not wait for the following signs of extreme hunger to occur before you initiate breastfeeding:   Restlessness.  A loud, strong cry.   Screaming. BREASTFEEDING BASICS Breastfeeding Initiation  Find a comfortable place to sit or lie down, with your neck and back well supported.  Place a pillow or rolled up blanket under your baby to bring him or her to the level of your breast (if you are seated). Nursing pillows are specially designed to help support your arms and your baby  while you breastfeed.  Make sure that your baby's abdomen is facing your abdomen.   Gently massage your breast. With your fingertips, massage from your chest wall toward your nipple in a circular motion. This encourages milk flow. You may need to continue this action during the feeding if your milk flows slowly.  Support your breast with 4 fingers underneath and your thumb above your nipple. Make sure your fingers are well away from your nipple and your baby's mouth.   Stroke your baby's lips gently with your finger or nipple.   When your baby's mouth is open wide enough, quickly bring your baby to your breast, placing your entire nipple and as much of the colored area around your nipple (areola) as possible into your baby's mouth.   More areola should be visible above your baby's upper lip than below the lower lip.   Your baby's tongue should be between his or her lower gum and your breast.   Ensure that your baby's mouth is correctly positioned around your nipple (latched). Your baby's lips should create a seal on your breast and be turned out (everted).  It is common for your baby to suck about 2-3 minutes in order to start the flow of breast milk. Latching Teaching your baby how to latch on to your breast properly is very important. An improper latch can cause nipple  pain and decreased milk supply for you and poor weight gain in your baby. Also, if your baby is not latched onto your nipple properly, he or she may swallow some air during feeding. This can make your baby fussy. Burping your baby when you switch breasts during the feeding can help to get rid of the air. However, teaching your baby to latch on properly is still the best way to prevent fussiness from swallowing air while breastfeeding. Signs that your baby has successfully latched on to your nipple:    Silent tugging or silent sucking, without causing you pain.   Swallowing heard between every 3-4 sucks.    Muscle movement above and in front of his or her ears while sucking.  Signs that your baby has not successfully latched on to nipple:   Sucking sounds or smacking sounds from your baby while breastfeeding.  Nipple pain. If you think your baby has not latched on correctly, slip your finger into the corner of your baby's mouth to break the suction and place it between your baby's gums. Attempt breastfeeding initiation again. Signs of Successful Breastfeeding Signs from your baby:   A gradual decrease in the number of sucks or complete cessation of sucking.   Falling asleep.   Relaxation of his or her body.   Retention of a small amount of milk in his or her mouth.   Letting go of your breast by himself or herself. Signs from you:  Breasts that have increased in firmness, weight, and size 1-3 hours after feeding.   Breasts that are softer immediately after breastfeeding.  Increased milk volume, as well as a change in milk consistency and color by the fifth day of breastfeeding.   Nipples that are not sore, cracked, or bleeding. Signs That Your Pecola Leisure is Getting Enough Milk  Wetting at least 3 diapers in a 24-hour period. The urine should be clear and pale yellow by age 21 days.  At least 3 stools in a 24-hour period by age 21 days. The stool should be soft and  yellow.  At least 3 stools in a 24-hour  period by age 8 days. The stool should be seedy and yellow.  No loss of weight greater than 10% of birth weight during the first 1 days of age.  Average weight gain of 4-7 ounces (113-198 g) per week after age 74 days.  Consistent daily weight gain by age 32 days, without weight loss after the age of 2 weeks. After a feeding, your baby may spit up a small amount. This is common. BREASTFEEDING FREQUENCY AND DURATION Frequent feeding will help you make more milk and can prevent sore nipples and breast engorgement. Breastfeed when you feel the need to reduce the fullness of your breasts or when your baby shows signs of hunger. This is called "breastfeeding on demand." Avoid introducing a pacifier to your baby while you are working to establish breastfeeding (the first 4-6 weeks after your baby is born). After this time you may choose to use a pacifier. Research has shown that pacifier use during the first year of a baby's life decreases the risk of sudden infant death syndrome (SIDS). Allow your baby to feed on each breast as long as he or she wants. Breastfeed until your baby is finished feeding. When your baby unlatches or falls asleep while feeding from the first breast, offer the second breast. Because newborns are often sleepy in the first few weeks of life, you may need to awaken your baby to get him or her to feed. Breastfeeding times will vary from baby to baby. However, the following rules can serve as a guide to help you ensure that your baby is properly fed:  Newborns (babies 29 weeks of age or younger) may breastfeed every 1-3 hours.  Newborns should not go longer than 3 hours during the day or 5 hours during the night without breastfeeding.  You should breastfeed your baby a minimum of 8 times in a 24-hour period until you begin to introduce solid foods to your baby at around 107 months of age. BREAST MILK PUMPING Pumping and storing breast milk  allows you to ensure that your baby is exclusively fed your breast milk, even at times when you are unable to breastfeed. This is especially important if you are going back to work while you are still breastfeeding or when you are not able to be present during feedings. Your lactation consultant can give you guidelines on how long it is safe to store breast milk.  A breast pump is a machine that allows you to pump milk from your breast into a sterile bottle. The pumped breast milk can then be stored in a refrigerator or freezer. Some breast pumps are operated by hand, while others use electricity. Ask your lactation consultant which type will work best for you. Breast pumps can be purchased, but some hospitals and breastfeeding support groups lease breast pumps on a monthly basis. A lactation consultant can teach you how to hand express breast milk, if you prefer not to use a pump.  CARING FOR YOUR BREASTS WHILE YOU BREASTFEED Nipples can become dry, cracked, and sore while breastfeeding. The following recommendations can help keep your breasts moisturized and healthy:  Avoid using soap on your nipples.   Wear a supportive bra. Although not required, special nursing bras and tank tops are designed to allow access to your breasts for breastfeeding without taking off your entire bra or top. Avoid wearing underwire-style bras or extremely tight bras.  Air dry your nipples for 3-53minutes after each feeding.   Use only cotton bra pads to  absorb leaked breast milk. Leaking of breast milk between feedings is normal.   Use lanolin on your nipples after breastfeeding. Lanolin helps to maintain your skin's normal moisture barrier. If you use pure lanolin, you do not need to wash it off before feeding your baby again. Pure lanolin is not toxic to your baby. You may also hand express a few drops of breast milk and gently massage that milk into your nipples and allow the milk to air dry. In the first few weeks  after giving birth, some women experience extremely full breasts (engorgement). Engorgement can make your breasts feel heavy, warm, and tender to the touch. Engorgement peaks within 3-5 days after you give birth. The following recommendations can help ease engorgement:  Completely empty your breasts while breastfeeding or pumping. You may want to start by applying warm, moist heat (in the shower or with warm water-soaked hand towels) just before feeding or pumping. This increases circulation and helps the milk flow. If your baby does not completely empty your breasts while breastfeeding, pump any extra milk after he or she is finished.  Wear a snug bra (nursing or regular) or tank top for 1-2 days to signal your body to slightly decrease milk production.  Apply ice packs to your breasts, unless this is too uncomfortable for you.  Make sure that your baby is latched on and positioned properly while breastfeeding. If engorgement persists after 48 hours of following these recommendations, contact your health care provider or a Advertising copywriter. OVERALL HEALTH CARE RECOMMENDATIONS WHILE BREASTFEEDING  Eat healthy foods. Alternate between meals and snacks, eating 3 of each per day. Because what you eat affects your breast milk, some of the foods may make your baby more irritable than usual. Avoid eating these foods if you are sure that they are negatively affecting your baby.  Drink milk, fruit juice, and water to satisfy your thirst (about 10 glasses a day).   Rest often, relax, and continue to take your prenatal vitamins to prevent fatigue, stress, and anemia.  Continue breast self-awareness checks.  Avoid chewing and smoking tobacco.  Avoid alcohol and drug use. Some medicines that may be harmful to your baby can pass through breast milk. It is important to ask your health care provider before taking any medicine, including all over-the-counter and prescription medicine as well as vitamin  and herbal supplements. It is possible to become pregnant while breastfeeding. If birth control is desired, ask your health care provider about options that will be safe for your baby. SEEK MEDICAL CARE IF:   You feel like you want to stop breastfeeding or have become frustrated with breastfeeding.  You have painful breasts or nipples.  Your nipples are cracked or bleeding.  Your breasts are red, tender, or warm.  You have a swollen area on either breast.  You have a fever or chills.  You have nausea or vomiting.  You have drainage other than breast milk from your nipples.  Your breasts do not become full before feedings by the fifth day after you give birth.  You feel sad and depressed.  Your baby is too sleepy to eat well.  Your baby is having trouble sleeping.   Your baby is wetting less than 3 diapers in a 24-hour period.  Your baby has less than 3 stools in a 24-hour period.  Your baby's skin or the white part of his or her eyes becomes yellow.   Your baby is not gaining weight by 5 days  of age. SEEK IMMEDIATE MEDICAL CARE IF:   Your baby is overly tired (lethargic) and does not want to wake up and feed.  Your baby develops an unexplained fever. Document Released: 03/19/2005 Document Revised: 03/24/2013 Document Reviewed: 09/10/2012 [facility] Patient Information 2015 [place], [place]. This information is not intended to replace advice given to you by your health care provider. Make sure you discuss any questions you have with your health care provider.

## 2013-12-11 ENCOUNTER — Ambulatory Visit (INDEPENDENT_AMBULATORY_CARE_PROVIDER_SITE_OTHER): Payer: BC Managed Care – PPO | Admitting: Obstetrics & Gynecology

## 2013-12-11 ENCOUNTER — Encounter: Payer: Self-pay | Admitting: Obstetrics & Gynecology

## 2013-12-11 VITALS — BP 122/88 | HR 92 | Wt 250.6 lb

## 2013-12-11 DIAGNOSIS — Z3685 Encounter for antenatal screening for Streptococcus B: Secondary | ICD-10-CM

## 2013-12-11 DIAGNOSIS — O358XX0 Maternal care for other (suspected) fetal abnormality and damage, not applicable or unspecified: Secondary | ICD-10-CM

## 2013-12-11 DIAGNOSIS — O09529 Supervision of elderly multigravida, unspecified trimester: Secondary | ICD-10-CM

## 2013-12-11 DIAGNOSIS — Z3480 Encounter for supervision of other normal pregnancy, unspecified trimester: Secondary | ICD-10-CM

## 2013-12-11 DIAGNOSIS — O09523 Supervision of elderly multigravida, third trimester: Secondary | ICD-10-CM

## 2013-12-11 DIAGNOSIS — Z113 Encounter for screening for infections with a predominantly sexual mode of transmission: Secondary | ICD-10-CM | POA: Diagnosis not present

## 2013-12-11 DIAGNOSIS — O283 Abnormal ultrasonic finding on antenatal screening of mother: Secondary | ICD-10-CM

## 2013-12-11 LAB — OB RESULTS CONSOLE GC/CHLAMYDIA
Chlamydia: POSITIVE
Gonorrhea: NEGATIVE

## 2013-12-11 NOTE — Progress Notes (Signed)
Declines flu vaccine for today, just getting a cold episode 36 week scan scheduled Pelvic cultures done today No other complaints or concerns.  Labor and fetal movement precautions reviewed.

## 2013-12-11 NOTE — Patient Instructions (Signed)
Return to clinic for any obstetric concerns or go to [facility] for evaluation  

## 2013-12-12 ENCOUNTER — Encounter: Payer: Self-pay | Admitting: Obstetrics & Gynecology

## 2013-12-12 ENCOUNTER — Inpatient Hospital Stay (HOSPITAL_COMMUNITY)
Admission: AD | Admit: 2013-12-12 | Discharge: 2013-12-15 | DRG: 775 | Disposition: A | Discharge status: Home / Self Care | Payer: BC Managed Care – PPO | Source: Ambulatory Visit | Attending: Obstetrics & Gynecology | Admitting: Obstetrics & Gynecology

## 2013-12-12 ENCOUNTER — Encounter (HOSPITAL_COMMUNITY): Payer: Self-pay | Admitting: *Deleted

## 2013-12-12 DIAGNOSIS — E669 Obesity, unspecified: Secondary | ICD-10-CM | POA: Diagnosis present

## 2013-12-12 DIAGNOSIS — O42919 Preterm premature rupture of membranes, unspecified as to length of time between rupture and onset of labor, unspecified trimester: Secondary | ICD-10-CM

## 2013-12-12 DIAGNOSIS — O99214 Obesity complicating childbirth: Secondary | ICD-10-CM

## 2013-12-12 DIAGNOSIS — A5609 Other chlamydial infection of lower genitourinary tract: Secondary | ICD-10-CM | POA: Diagnosis not present

## 2013-12-12 DIAGNOSIS — O4290 Premature rupture of membranes, unspecified as to length of time between rupture and onset of labor, unspecified weeks of gestation: Principal | ICD-10-CM | POA: Diagnosis present

## 2013-12-12 DIAGNOSIS — Z6838 Body mass index (BMI) 38.0-38.9, adult: Secondary | ICD-10-CM | POA: Diagnosis not present

## 2013-12-12 DIAGNOSIS — O42913 Preterm premature rupture of membranes, unspecified as to length of time between rupture and onset of labor, third trimester: Secondary | ICD-10-CM

## 2013-12-12 DIAGNOSIS — Z87891 Personal history of nicotine dependence: Secondary | ICD-10-CM | POA: Diagnosis not present

## 2013-12-12 DIAGNOSIS — O2390 Unspecified genitourinary tract infection in pregnancy, unspecified trimester: Secondary | ICD-10-CM | POA: Diagnosis present

## 2013-12-12 DIAGNOSIS — N739 Female pelvic inflammatory disease, unspecified: Secondary | ICD-10-CM | POA: Diagnosis present

## 2013-12-12 DIAGNOSIS — O09529 Supervision of elderly multigravida, unspecified trimester: Secondary | ICD-10-CM | POA: Diagnosis present

## 2013-12-12 DIAGNOSIS — O99891 Other specified diseases and conditions complicating pregnancy: Secondary | ICD-10-CM | POA: Diagnosis present

## 2013-12-12 DIAGNOSIS — O283 Abnormal ultrasonic finding on antenatal screening of mother: Secondary | ICD-10-CM

## 2013-12-12 DIAGNOSIS — A5619 Other chlamydial genitourinary infection: Secondary | ICD-10-CM | POA: Diagnosis present

## 2013-12-12 DIAGNOSIS — O98813 Other maternal infectious and parasitic diseases complicating pregnancy, third trimester: Secondary | ICD-10-CM | POA: Insufficient documentation

## 2013-12-12 DIAGNOSIS — Z3483 Encounter for supervision of other normal pregnancy, third trimester: Secondary | ICD-10-CM

## 2013-12-12 DIAGNOSIS — O09523 Supervision of elderly multigravida, third trimester: Secondary | ICD-10-CM

## 2013-12-12 DIAGNOSIS — A749 Chlamydial infection, unspecified: Secondary | ICD-10-CM | POA: Insufficient documentation

## 2013-12-12 LAB — OB RESULTS CONSOLE GBS: GBS: NEGATIVE

## 2013-12-12 LAB — GC/CHLAMYDIA PROBE AMP
CT Probe RNA: POSITIVE — AB
GC Probe RNA: NEGATIVE

## 2013-12-12 LAB — CBC
HCT: 37.4 % (ref 36.0–46.0)
Hemoglobin: 13.5 g/dL (ref 12.0–15.0)
MCH: 33.8 pg (ref 26.0–34.0)
MCHC: 36.1 g/dL — ABNORMAL HIGH (ref 30.0–36.0)
MCV: 93.7 fL (ref 78.0–100.0)
Platelets: 239 10*3/uL (ref 150–400)
RBC: 3.99 MIL/uL (ref 3.87–5.11)
RDW: 12.8 % (ref 11.5–15.5)
WBC: 10.5 10*3/uL (ref 4.0–10.5)

## 2013-12-12 LAB — GROUP B STREP BY PCR: Group B strep by PCR: NEGATIVE

## 2013-12-12 LAB — RPR

## 2013-12-12 LAB — RAPID HIV SCREEN (WH-MAU): Rapid HIV Screen: NONREACTIVE

## 2013-12-12 MED ORDER — AZITHROMYCIN 250 MG PO TABS
1000.0000 mg | ORAL_TABLET | Freq: Once | ORAL | Status: AC
Start: 2013-12-12 — End: 2013-12-12
  Administered 2013-12-12: 1000 mg via ORAL
  Filled 2013-12-12: qty 4

## 2013-12-12 MED ORDER — LACTATED RINGERS IV SOLN
INTRAVENOUS | Status: DC
  Administered 2013-12-12 – 2013-12-13 (×3): via INTRAVENOUS

## 2013-12-12 NOTE — MAU Provider Note (Addendum)
Chief Complaint:  Rupture of Membranes       HPI: Krystal Zamora is a 33 y.o. [id number] at [redacted]w[redacted]d who presents to maternity admissions reporting SROM 12/11/13 at 2200 with gush followed by continuing trickle clear fluid.vement.   Pregnancy Course:   Past Medical History: Past Medical History  Diagnosis Date  . Hepatitis C virus 2004    s/p one month interferon treatment   . Hx of abnormal Pap smear 1997    s/p LEEP procedure.  . Hepatitis C   . Aspiration of vomit 2004  . 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 Aspiration biopsy      in ICU for aspiration of vomit for 7 weeks  . Liver biopsy  2009  . Wisdom tooth extraction       Family History: Family History  Problem Relation Age of Onset  . Adopted: Yes  . Other Neg Hx     Social History: History  Substance Use Topics  . Smoking status: Former Smoker    Quit date: 12/12/2005  . Smokeless tobacco: Never Used  . 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 Sulfa Antibiotics Hives    Meds:  Prescriptions prior to admission  Medication Sig Dispense Refill  . acetaminophen (TYLENOL) 500 MG tablet Take 1,000 mg by mouth every 6 (six) hours as needed for moderate pain.      . calcium carbonate (TUMS - DOSED IN MG ELEMENTAL CALCIUM) 500 MG chewable tablet Chew 1 tablet by mouth daily.      . Prenatal Vit-Fe Fumarate-FA (PRENATAL MULTIVITAMIN) TABS tablet Take 1 tablet by mouth daily.        ROS: Pertinent  findings in history of present illness.  Physical Exam  Blood pressure 117/89, pulse 108, temperature 97.8 F (36.6 C), temperature source Oral, resp. rate 18, height  (1.727 m), weight 113.036 kg (249 lb 3.2 oz), last menstrual period 04/06/2013. GENERAL: Well-developed, well-nourished female in no acute distress.  HEENT: normocephalic HEART: normal rate RESP: normal effort ABDOMEN: Soft, non-tender, gravid appropriate for gestational age EXTREMITIES: Nontender, no edema NEURO: alert and oriented  SVper RN: 2/50, clear fluid, fern positive  FHT:  Baseline 130 merate variability, accelerations present, no decelerations Contractions: rare, mild Labs: No results found for this or any previous visit (from the past 24 hour(s)).  Imaging:  US Ob Follow Up  11/20/2013   OBSTETRICAL ULTRASOUND: This exam was performed within a [place] [facility]. The OB US report was generated in the AS system, and faxed to the ordering physician.   This report  is available in the [employer]. See the AS Obstetric US report via the Image Link.  MAU Course:   Assessment: 1. Abnormal fetal ultrasound   2. Elderly multigravida with antepartum condition or complication, third trimester   3. Preterm premature rupture of membranes in third trimester, unspecified duration to onset of labor     Plan: Admit for IOL. See H&P  Danae Orleans, CNM 12/12/2013 9:26 AM   Attestation of Attending Supervision of Advanced Practitioner (PA/CNM/NP): Evaluation and management procedures were performed by the Advanced Practitioner under my supervision and collaboration.  I have reviewed the Advanced Practitioner's note and chart, and I agree with the management and plan.  Jaynie Collins, MD, FACOG Attending Obstetrician & Gynecologist Faculty Practice, [facility]  Addendum 11:06 Discussed +CT with pt and partner. Will treat with Zithro 1 GM po and advised partner to get  treatment at HD, abstinence until TOC. Also explained GBS risk due to preterm status unless we can confirm negativity of yesterday's GBS culture or do rapid strep.   Danae Orleans, CNM 12/12/2013 11:10 AM

## 2013-12-12 NOTE — H&P (Signed)
L&D Admission H&P  Chief Complaint: Rupture of Membranes   HPI: Krystal Zamora is a 33 y.o. [id number] at [redacted]w[redacted]d who presents to maternity admissions reporting SROM 12/11/13 at 2200 with gush followed by continuing trickle clear fluid.+ fetal movement.  Pregnancy Course:   Clinic  [facility]  Dating  by first trimester ultrasound  Genetic Screen  NIPS--WNL boy   Anatomic US Fetal left renal pyelectasis 6.4 mm at 28 wks--<7 mm at 34 wks--probably ok; enlarged cisterna magna at 10.3 mm at 34wks-of unlikely clinical significance  GTT  Third trimester: 88  TDaP vaccine 10/27/2013  GBS Pending  Contraception  Vasectomy--Tubal if needs C-section  Baby Food Bottle  Circumcision Yes  Pediatrician [facility]  Support Person Husband  Positive chlamydia culture on 12/11/13.  Patient Active Problem List   Diagnosis Date Noted  . Chlamydia infection complicating pregnancy in third trimester 12/12/2013  . Enlarged cisterna magna of fetus  10/27/2013  . Threatened preterm labor 10/27/2013  . Fetal renal pyelectasis, antepartum  09/13/2013  . AMA 07/06/2013  . Supervision of other normal pregnancy 06/12/2013  . Hepatitis C 05/16/2011   Past Medical History:  Past Medical History  Diagnosis Date  . Hepatitis C virus 2004    s/p one month interferon treatment   . Hx of abnormal Pap smear 1997    s/p LEEP procedure.  . Hepatitis C   . Aspiration of vomit 2004  . 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 Chlamydia 12/11/13    During pregnancy, diagnosed at [redacted]w[redacted]d, PPROM on [redacted]w[redacted]d    OB History   Gravida  Para  Term  Preterm  AB  SAB  TAB  Ectopic  Multiple  Living   #  Outcome  Date  GA  Lbr Len/2nd  Weight  Sex  Delivery  Anes  PTL  Lv   5  CUR            4  ABT  2014           3  TRM  2007    2.863 kg (6 lb 5 oz)  M  SVD      2  ABT  2006           1  TRM  1997    4.082 kg (9 lb)  F  SVD      Comments: adopted      Past Surgical History    Procedure  Laterality  Date   .  Aspiration biopsy       in ICU for aspiration of vomit for 7 weeks   .  Liver biopsy   2009   .  Wisdom tooth extraction       Family History   Problem  Relation  Age of Onset   .  Adopted: Yes   .  Other  Neg Hx     Social History:  History   Substance Use Topics   .  Smoking status:  Former Smoker     Quit date:  12/12/2005   .  Smokeless tobacco:  Never Used   .  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  Sulfa Antibiotics  Hives    Meds:  Prescriptions  prior to admission   Medication  Sig  Dispense  Refill   .  acetaminophen (TYLENOL) 500 MG tablet  Take 1,000 mg by mouth every 6 (six) hours as needed for moderate pain.     .  calcium carbonate (TUMS - DOSED IN MG ELEMENTAL CALCIUM) 500 MG chewable tablet  Chew 1 tablet by mouth daily.     .  Prenatal Vit-Fe Fumarate-FA (PRENATAL MULTIVITAMIN) TABS tablet  Take 1 tablet by mouth daily.      ROS: Pertinent findings in history of present illness.   Physical Exam   Blood pressure 117/89, pulse 108, temperature 97.8 F (36.6 C), temperature source Oral, resp. rate 18, height  (1.727 m), weight 113.036 kg (249 lb 3.2 oz), last menstrual period 04/06/2013.  GENERAL: Well-developed, well-nourished female in no acute distress.  HEENT: normocephalic  HEART: normal rate  RESP: normal effort  ABDOMEN: Soft, non-tender, gravid appropriate for gestational age  EXTREMITIES: Nontender, no edema  NEURO: alert and oriented  SVE per RN: 2/50, clear fluid, fern positive  FHT: Baseline 130 merate variability, accelerations present, no decelerations  Contractions: rare, mild   Labs:  No results found for this or any previous visit (from the past 24 hour(s)).    Assessment:  1.  Elderly multigravida with antepartum condition or complication, third trimester   2 Recent diagnosis of chlamydia  3.  Preterm premature rupture of  membranes in third trimester, unspecified duration to onset of labor     Plan:  Labor: Induction per protocol.  FWB: Reassuring fetal heart tracing. GBS pending, will treat with PCN.  ID: Patient informed of chlamydia diagnosis, added to PITT form.  Azithromycin 1 g po x 1 ordered. Delivery plan: Hopeful for vaginal delivery    Danae Orleans, CNM  12/12/2013  9:26 AM     Attestation of Attending Supervision of Advanced Practitioner (PA/CNM/NP): Evaluation and management procedures were performed by the Advanced Practitioner under my supervision and collaboration.  I have reviewed the Advanced Practitioner's note and chart, and I agree with the management and plan.  Jaynie Collins, MD, FACOG Attending Obstetrician & Gynecologist Faculty Practice, [facility]

## 2013-12-12 NOTE — MAU Note (Signed)
Water broke around 2230 last night.  Clear fluid, a little gush then a trickle after that all night long.  Some irregular contractions.  Denies VB

## 2013-12-12 NOTE — MAU Note (Signed)
Delay of transfer to birthing suites explained to pt, verbalizes understanding. 

## 2013-12-12 NOTE — MAU Provider Note (Signed)
Attestation of Attending Supervision of Advanced Practitioner (PA/CNM/NP): Evaluation and management procedures were performed by the Advanced Practitioner under my supervision and collaboration.  I have reviewed the Advanced Practitioner's note and chart, and I agree with the management and plan.  Jodel Mayhall, MD, FACOG Attending Obstetrician & Gynecologist Faculty Practice, [facility]   

## 2013-12-13 ENCOUNTER — Encounter (HOSPITAL_COMMUNITY): Payer: BC Managed Care – PPO | Admitting: Anesthesiology

## 2013-12-13 ENCOUNTER — Encounter (HOSPITAL_COMMUNITY): Payer: Self-pay | Admitting: *Deleted

## 2013-12-13 ENCOUNTER — Inpatient Hospital Stay (HOSPITAL_COMMUNITY): Payer: BC Managed Care – PPO | Admitting: Anesthesiology

## 2013-12-13 DIAGNOSIS — A5609 Other chlamydial infection of lower genitourinary tract: Secondary | ICD-10-CM

## 2013-12-13 DIAGNOSIS — O4290 Premature rupture of membranes, unspecified as to length of time between rupture and onset of labor, unspecified weeks of gestation: Secondary | ICD-10-CM

## 2013-12-13 DIAGNOSIS — O09529 Supervision of elderly multigravida, unspecified trimester: Secondary | ICD-10-CM

## 2013-12-13 LAB — TYPE AND SCREEN
ABO/RH(D): A POS
Antibody Screen: NEGATIVE

## 2013-12-13 LAB — ABO/RH: ABO/RH(D): A POS

## 2013-12-13 MED ORDER — WITCH HAZEL-GLYCERIN EX PADS: 1.0000 "application " | MEDICATED_PAD | CUTANEOUS | Status: DC | PRN

## 2013-12-13 MED ORDER — DIPHENHYDRAMINE HCL 25 MG PO CAPS: 25.0000 mg | ORAL_CAPSULE | Freq: Four times a day (QID) | ORAL | Status: DC | PRN

## 2013-12-13 MED ORDER — SODIUM CHLORIDE 0.9 % IJ SOLN: 3.0000 mL | Freq: Two times a day (BID) | INTRAMUSCULAR | Status: DC

## 2013-12-13 MED ORDER — PENICILLIN G POTASSIUM 5000000 UNITS IJ SOLR
5.0000 10*6.[IU] | Freq: Once | INTRAVENOUS | Status: AC
  Administered 2013-12-13: 5 10*6.[IU] via INTRAVENOUS
  Filled 2013-12-13: qty 5

## 2013-12-13 MED ORDER — EPHEDRINE 5 MG/ML INJ
10.0000 mg | INTRAVENOUS | Status: DC | PRN
  Filled 2013-12-13: qty 2

## 2013-12-13 MED ORDER — SODIUM CHLORIDE 0.9 % IV SOLN: 250.0000 mL | INTRAVENOUS | Status: DC | PRN

## 2013-12-13 MED ORDER — ONDANSETRON HCL 4 MG/2ML IJ SOLN: 4.0000 mg | Freq: Four times a day (QID) | INTRAMUSCULAR | Status: DC | PRN

## 2013-12-13 MED ORDER — BENZOCAINE-MENTHOL 20-0.5 % EX AERO
1.0000 "application " | INHALATION_SPRAY | CUTANEOUS | Status: DC | PRN
  Filled 2013-12-13 (×2): qty 56

## 2013-12-13 MED ORDER — ONDANSETRON HCL 4 MG PO TABS: 4.0000 mg | ORAL_TABLET | ORAL | Status: DC | PRN

## 2013-12-13 MED ORDER — SENNOSIDES-DOCUSATE SODIUM 8.6-50 MG PO TABS
2.0000 | ORAL_TABLET | ORAL | Status: DC
  Administered 2013-12-14: 2 via ORAL
  Filled 2013-12-13 (×2): qty 2

## 2013-12-13 MED ORDER — CITRIC ACID-SODIUM CITRATE 334-500 MG/5ML PO SOLN: 30.0000 mL | ORAL | Status: DC | PRN

## 2013-12-13 MED ORDER — OXYTOCIN 40 UNITS IN LACTATED RINGERS INFUSION - SIMPLE MED: 62.5000 mL/h | INTRAVENOUS | Status: DC | PRN

## 2013-12-13 MED ORDER — SIMETHICONE 80 MG PO CHEW: 80.0000 mg | CHEWABLE_TABLET | ORAL | Status: DC | PRN

## 2013-12-13 MED ORDER — LANOLIN HYDROUS EX OINT: TOPICAL_OINTMENT | CUTANEOUS | Status: DC | PRN

## 2013-12-13 MED ORDER — LACTATED RINGERS IV SOLN
500.0000 mL | Freq: Once | INTRAVENOUS | Status: AC
  Administered 2013-12-13: 16:00:00 via INTRAVENOUS

## 2013-12-13 MED ORDER — FLEET ENEMA 7-19 GM/118ML RE ENEM: 1.0000 | ENEMA | Freq: Every day | RECTAL | Status: DC | PRN

## 2013-12-13 MED ORDER — PHENYLEPHRINE 40 MCG/ML (10ML) SYRINGE FOR IV PUSH (FOR BLOOD PRESSURE SUPPORT)
80.0000 ug | PREFILLED_SYRINGE | INTRAVENOUS | Status: DC | PRN
  Filled 2013-12-13: qty 2
  Filled 2013-12-13: qty 10

## 2013-12-13 MED ORDER — DIPHENHYDRAMINE HCL 50 MG/ML IJ SOLN: 12.5000 mg | INTRAMUSCULAR | Status: DC | PRN

## 2013-12-13 MED ORDER — PRENATAL MULTIVITAMIN CH
1.0000 | ORAL_TABLET | Freq: Every day | ORAL | Status: DC
  Administered 2013-12-14 – 2013-12-15 (×2): 1 via ORAL
  Filled 2013-12-13 (×3): qty 1

## 2013-12-13 MED ORDER — DIBUCAINE 1 % RE OINT
1.0000 "application " | TOPICAL_OINTMENT | RECTAL | Status: DC | PRN
  Filled 2013-12-13: qty 28

## 2013-12-13 MED ORDER — LIDOCAINE HCL (PF) 1 % IJ SOLN
INTRAMUSCULAR | Status: AC
  Filled 2013-12-13: qty 30

## 2013-12-13 MED ORDER — OXYTOCIN 40 UNITS IN LACTATED RINGERS INFUSION - SIMPLE MED: 62.5000 mL/h | INTRAVENOUS | Status: DC

## 2013-12-13 MED ORDER — TERBUTALINE SULFATE 1 MG/ML IJ SOLN: 0.2500 mg | Freq: Once | INTRAMUSCULAR | Status: DC | PRN

## 2013-12-13 MED ORDER — ONDANSETRON HCL 4 MG/2ML IJ SOLN: 4.0000 mg | INTRAMUSCULAR | Status: DC | PRN

## 2013-12-13 MED ORDER — PHENYLEPHRINE 40 MCG/ML (10ML) SYRINGE FOR IV PUSH (FOR BLOOD PRESSURE SUPPORT)
80.0000 ug | PREFILLED_SYRINGE | INTRAVENOUS | Status: DC | PRN
  Filled 2013-12-13: qty 2

## 2013-12-13 MED ORDER — FENTANYL 2.5 MCG/ML BUPIVACAINE 1/10 % EPIDURAL INFUSION (WH - ANES)
14.0000 mL/h | INTRAMUSCULAR | Status: DC | PRN
  Administered 2013-12-13: 14 mL/h via EPIDURAL

## 2013-12-13 MED ORDER — ACETAMINOPHEN 325 MG PO TABS: 650.0000 mg | ORAL_TABLET | ORAL | Status: DC | PRN

## 2013-12-13 MED ORDER — OXYCODONE-ACETAMINOPHEN 5-325 MG PO TABS: 2.0000 | ORAL_TABLET | ORAL | Status: DC | PRN

## 2013-12-13 MED ORDER — OXYCODONE-ACETAMINOPHEN 5-325 MG PO TABS
1.0000 | ORAL_TABLET | ORAL | Status: DC | PRN
  Administered 2013-12-13 – 2013-12-15 (×4): 1 via ORAL
  Filled 2013-12-13 (×3): qty 1

## 2013-12-13 MED ORDER — FENTANYL 2.5 MCG/ML BUPIVACAINE 1/10 % EPIDURAL INFUSION (WH - ANES)
14.0000 mL/h | INTRAMUSCULAR | Status: DC | PRN
  Administered 2013-12-13: 14 mL/h via EPIDURAL
  Filled 2013-12-13: qty 125

## 2013-12-13 MED ORDER — LACTATED RINGERS IV SOLN: 500.0000 mL | INTRAVENOUS | Status: DC | PRN

## 2013-12-13 MED ORDER — ZOLPIDEM TARTRATE 5 MG PO TABS: 5.0000 mg | ORAL_TABLET | Freq: Every evening | ORAL | Status: DC | PRN

## 2013-12-13 MED ORDER — OXYTOCIN BOLUS FROM INFUSION: 500.0000 mL | INTRAVENOUS | Status: DC

## 2013-12-13 MED ORDER — TETANUS-DIPHTH-ACELL PERTUSSIS 5-2.5-18.5 LF-MCG/0.5 IM SUSP
0.5000 mL | Freq: Once | INTRAMUSCULAR | Status: DC
  Filled 2013-12-13: qty 0.5

## 2013-12-13 MED ORDER — DEXTROSE 5 % IV SOLN
2.5000 10*6.[IU] | INTRAVENOUS | Status: DC
  Administered 2013-12-13 (×3): 2.5 10*6.[IU] via INTRAVENOUS
  Filled 2013-12-13 (×6): qty 2.5

## 2013-12-13 MED ORDER — OXYCODONE-ACETAMINOPHEN 5-325 MG PO TABS
2.0000 | ORAL_TABLET | ORAL | Status: DC | PRN
  Filled 2013-12-13: qty 2

## 2013-12-13 MED ORDER — SODIUM CHLORIDE 0.9 % IJ SOLN: 3.0000 mL | INTRAMUSCULAR | Status: DC | PRN

## 2013-12-13 MED ORDER — IBUPROFEN 600 MG PO TABS
600.0000 mg | ORAL_TABLET | Freq: Four times a day (QID) | ORAL | Status: DC
  Administered 2013-12-13 – 2013-12-15 (×8): 600 mg via ORAL
  Filled 2013-12-13 (×9): qty 1

## 2013-12-13 MED ORDER — OXYTOCIN 40 UNITS IN LACTATED RINGERS INFUSION - SIMPLE MED
1.0000 m[IU]/min | INTRAVENOUS | Status: DC
  Administered 2013-12-13: 2 m[IU]/min via INTRAVENOUS
  Filled 2013-12-13: qty 1000

## 2013-12-13 MED ORDER — LIDOCAINE HCL (PF) 1 % IJ SOLN
INTRAMUSCULAR | Status: DC | PRN
  Administered 2013-12-13: 10 mL

## 2013-12-13 MED ORDER — BUTORPHANOL TARTRATE 1 MG/ML IJ SOLN
1.0000 mg | INTRAMUSCULAR | Status: DC | PRN
  Administered 2013-12-13: 1 mg via INTRAVENOUS
  Filled 2013-12-13: qty 1

## 2013-12-13 NOTE — Anesthesia Preprocedure Evaluation (Signed)

## 2013-12-13 NOTE — Progress Notes (Signed)
Krystal Zamora is a 33 y.o. [id number] at [redacted]w[redacted]d by ultrasound admitted for induction of labor due to PPROM.  Subjective:   Objective: BP 155/83  Pulse 89  Temp(Src) 98.1 F (36.7 C) (Oral)  Resp 20  Ht  (1.727 m)  Wt 249 lb 3.2 oz (113.036 kg)  BMI 37.90 kg/m2  LMP 04/06/2013      FHT:  FHR: 130 bpm, variability: moderate,  accelerations:  Present,  decelerations:  Absent UC:   regular, every 2-3 minutes SVE:   Dilation: 6 Effacement (%): 90 Station: -1 Exam by:: Cleone Slim RNC   Labs: Lab Results  Component Value Date   WBC 10.5 12/12/2013   HGB 13.5 12/12/2013   HCT 37.4 12/12/2013   MCV 93.7 12/12/2013   PLT 239 12/12/2013    Assessment / Plan: Induction of labor due to PPROM,  progressing well on pitocin  Labor: Progressing normally Preeclampsia:  no signs or symptoms of toxicity Fetal Wellbeing:  Category I Pain Control:  Labor support without medications I/D:  n/a Anticipated MOD:  NSVD  Yessenia Maillet [name] 12/13/2013, 4:03 PM

## 2013-12-13 NOTE — Progress Notes (Signed)
Krystal Zamora is a 33 y.o. [id number] at [redacted]w[redacted]d by ultrasound admitted for rupture of membranes, Preterm labor, PROM  Subjective:   Objective: BP 120/80  Pulse 76  Temp(Src) 98.2 F (36.8 C) (Oral)  Resp 18  Ht  (1.727 m)  Wt 249 lb 3.2 oz (113.036 kg)  BMI 37.90 kg/m2  LMP 04/06/2013      FHT:  FHR: 120 bpm, variability: moderate,  accelerations:  Present,  decelerations:  Absent UC:   irregular, every 2-5 minutes SVE:   Dilation: 4 Effacement (%): 70 Station: -2 Exam by:: Marlynn Perking, CNM  Labs: Lab Results  Component Value Date   WBC 10.5 12/12/2013   HGB 13.5 12/12/2013   HCT 37.4 12/12/2013   MCV 93.7 12/12/2013   PLT 239 12/12/2013    Assessment / Plan: Induction of labor due to PROM,  progressing well on pitocin  Labor: Progressing normally Preeclampsia:  no signs or symptoms of toxicity Fetal Wellbeing:  Category I Pain Control:  Labor support without medications I/D:  n/a Anticipated MOD:  NSVD  Adrielle Polakowski [name] 12/13/2013, 11:51 AM

## 2013-12-13 NOTE — Progress Notes (Signed)
Krystal Zamora is a 33 y.o. [id number] at [redacted]w[redacted]d by ultrasound admitted for induction of labor due to Spontaneous rupture of BOW., rupture of membranes, Preterm labor, PROM  Subjective:   Objective: BP 119/78  Pulse 77  Temp(Src) 98.2 F (36.8 C) (Oral)  Resp 18  Ht  (1.727 m)  Wt 249 lb 3.2 oz (113.036 kg)  BMI 37.90 kg/m2  LMP 04/06/2013      FHT:  FHR: 120 bpm, variability: moderate,  accelerations:  Present,  decelerations:  Absent UC:   regular, every 2 to5 minutes SVE:   Dilation: 4 Effacement (%): 70 Station: -2 Exam by:: Krystal Zamora, CNM  Labs: Lab Results  Component Value Date   WBC 10.5 12/12/2013   HGB 13.5 12/12/2013   HCT 37.4 12/12/2013   MCV 93.7 12/12/2013   PLT 239 12/12/2013    Assessment / Plan: Induction of labor due to PROM and PROM,  progressing well on pitocin  Labor: Progressing normally Preeclampsia:  no signs or symptoms of toxicity, intake and ouput balanced and labs stable Fetal Wellbeing:  Category I Pain Control:  Labor support without medications I/D:  n/a Anticipated MOD:  NSVD  Krystal Zamora Krystal Zamora 12/13/2013, 9:24 AM

## 2013-12-13 NOTE — Anesthesia Procedure Notes (Signed)
Epidural Patient location during procedure: OB  Preanesthetic Checklist Completed: patient identified, site marked, surgical consent, pre-op evaluation, timeout performed, IV checked, risks and benefits discussed and monitors and equipment checked  Epidural Patient position: sitting Prep: site prepped and draped and DuraPrep Patient monitoring: continuous pulse ox and blood pressure Approach: midline Injection technique: LOR air  Needle:  Needle type: Tuohy  Needle gauge: 17 G Needle length: 9 cm and 9 Needle insertion depth: 7 cm Catheter type: closed end flexible Catheter size: 19 Gauge Catheter at skin depth: 14 cm Test dose: negative  Assessment Events: blood not aspirated, injection not painful, no injection resistance, negative IV test and no paresthesia  Additional Notes Complete at time of final dosing Dosing of Epidural:  1st dose, through catheter ............................................Marland Kitchen  Xylocaine 40 mg  2nd dose, through catheter, after waiting 3 minutes........Marland KitchenXylocaine 60 mg    ( 1% Xylo charted as a single dose in Epic Meds for ease of charting; actual dosing was fractionated as above, for saftey's sake)  As each dose occurred, patient was free of IV sx; and patient exhibited no evidence of SA injection.  Patient is more comfortable after epidural dosed. Please see RN's note for documentation of vital signs,and FHR which are stable.  Patient reminded not to try to ambulate with numb legs, and that an RN must be present when she attempts to get up.

## 2013-12-13 NOTE — Lactation Note (Signed)
This note was copied from the chart of Krystal Cele Mote. Lactation Consultation Note  Patient Name: Krystal Zamora OZHYQ'M Date: 12/13/2013 Reason for consult: Other (Comment) (charting for exclusion)   Maternal Data Formula Feeding for Exclusion: Yes Reason for exclusion: Mother's choice to formula feed on admision  Feeding    LATCH Score/Interventions                      Lactation Tools Discussed/Used     Consult Status Consult Status: Complete    Lynda Rainwater 12/13/2013, 11:43 PM

## 2013-12-13 NOTE — Progress Notes (Signed)
Krystal Zamora is a 33 y.o. [id number] at [redacted]w[redacted]d by ultrasound admitted for PPROM   Subjective: Sleeping. Epidural placed a few hours ago.   Objective: BP 109/68  Pulse 88  Temp(Src) 98.4 F (36.9 C) (Oral)  Resp 18  Ht  (1.727 m)  Wt 249 lb 3.2 oz (113.036 kg)  BMI 37.90 kg/m2  LMP 04/06/2013      FHT:  FHR: 140 bpm, variability: moderate,  accelerations:  Present,  decelerations:  Absent UC:   irregular, every 2-4 minutes SVE:   Dilation: 3 Effacement (%): 70 Station: -2 Exam by:: Doreatha Massed, RNC  Labs: Lab Results  Component Value Date   WBC 10.5 12/12/2013   HGB 13.5 12/12/2013   HCT 37.4 12/12/2013   MCV 93.7 12/12/2013   PLT 239 12/12/2013    Assessment / Plan: Augmentation of labor, progressing well  Labor: Progressing well, albeit slowly Preeclampsia:  n/a Fetal Wellbeing:  Category I Pain Control:  Epidural I/D:  n/a Anticipated MOD:  NSVD  Aulton Routt 12/13/2013, 5:05 AM

## 2013-12-13 NOTE — Progress Notes (Signed)
Krystal Zamora is a 32 y.o. [id number] at [redacted]w[redacted]d by ultrasound admitted for induction of labor due to PPROM without labor .  Subjective: Doing well. Will want epidural later for pain  Objective: BP 124/79  Pulse 110  Temp(Src) 97.6 F (36.4 C) (Oral)  Resp 20  Ht  (1.727 m)  Wt 249 lb 3.2 oz (113.036 kg)  BMI 37.90 kg/m2  LMP 04/06/2013      FHT:  FHR: 135 bpm, variability: moderate,  accelerations:  Present,  decelerations:  Absent UC:   irregular, every 10 minutes SVE:      Labs: Lab Results  Component Value Date   WBC 10.5 12/12/2013   HGB 13.5 12/12/2013   HCT 37.4 12/12/2013   MCV 93.7 12/12/2013   PLT 239 12/12/2013    Assessment / Plan: Induction of labor due to PROM,  progressing well on pitocin  Labor: Progressing slowly, will start Pitocin Preeclampsia:  n/a Fetal Wellbeing:  Category I Pain Control:  Epidural I/D:  n/a Anticipated MOD:  NSVD  WILLIAMS,MARIE 12/13/2013, 1:00 AM

## 2013-12-14 LAB — GC/CHLAMYDIA PROBE AMP
CT Probe RNA: POSITIVE — AB
GC Probe RNA: NEGATIVE

## 2013-12-14 MED ORDER — HYDROCORTISONE 1 % EX CREA
TOPICAL_CREAM | Freq: Four times a day (QID) | CUTANEOUS | Status: DC
  Administered 2013-12-14: 17:00:00 via TOPICAL
  Filled 2013-12-14: qty 28

## 2013-12-14 NOTE — Progress Notes (Signed)
CSW acknowledged consult for MOB's history significant for heroin use.  Upon completion of chart review, CSW is screening out referral due to prenatal records reporting that MOB has not engaged in substance use since 2007.  MOB's UDS negative in July 2015.   Please re-consult CSW if concerns arise or upon MOB request.  

## 2013-12-14 NOTE — Anesthesia Postprocedure Evaluation (Signed)
Anesthesia Post Note  Patient: Krystal Zamora, broadcasting/film/video  Procedure(s) Performed: * No procedures listed *  Anesthesia type: Epidural  Patient location: Mother/Baby  Post pain: Pain level controlled  Post assessment: Post-op Vital signs reviewed  Last Vitals:  Filed Vitals:   12/14/13 0626  BP: 115/75  Pulse: 85  Temp: 36.5 C  Resp: 20    Post vital signs: Reviewed  Level of consciousness: awake  Complications: No apparent anesthesia complications

## 2013-12-14 NOTE — Progress Notes (Signed)
Patient requested a percocet for pain; up until then, she had refused.  She was given one percocet at 2316.  Followup pain assessment at 0030 was "no pain." At 0045 patient's motrin was given as scheduled med. At 0200 patient called out for more pain medicine, c/o pain on either side of uterus (near round ligaments), and reported that she had just passed a walnut-sized clot in the bathroom. An additional percocet was given, according to new administration guidelines; patient was asked to report any increased pain immediately.  Floor vital signs and fundal assessments have been within normal limits.

## 2013-12-14 NOTE — Progress Notes (Signed)
Post Partum Day 1 Subjective: no complaints, up ad lib, voiding, tolerating PO and + flatus  Objective: Blood pressure 115/75, pulse 85, temperature 97.7 F (36.5 C), temperature source Oral, resp. rate 20, height  (1.727 m), weight 249 lb 3.2 oz (113.036 kg), last menstrual period 04/06/2013, SpO2 98.00%, unknown if currently breastfeeding.  Physical Exam:  General: alert, cooperative, appears stated age and no distress Lochia: appropriate Uterine Fundus: firm Incision: n/a DVT Evaluation: No evidence of DVT seen on physical exam. Negative Homan's sign. No cords or calf tenderness. No significant calf/ankle edema.   Recent Labs  12/12/13 1030  HGB 13.5  HCT 37.4    Assessment/Plan: Plan for discharge tomorrow   LOS: 2 days   Wyvonnia Dusky [name] 12/14/2013, 6:58 AM

## 2013-12-15 LAB — CULTURE, BETA STREP (GROUP B ONLY)

## 2013-12-15 MED ORDER — IBUPROFEN 600 MG PO TABS: 600.0000 mg | ORAL_TABLET | Freq: Four times a day (QID) | ORAL | Status: DC

## 2013-12-15 NOTE — Discharge Summary (Signed)
Obstetric Discharge Summary Reason for Admission: induction of labor secondary to PPROM Prenatal Procedures: NIPS and ultrasound Intrapartum Procedures: spontaneous vaginal delivery Postpartum Procedures: none Complications-Operative and Postpartum: none Hemoglobin  Date Value Ref Range Status  12/12/2013 13.5  12.0 - 15.0 g/dL Final     HCT  Date Value Ref Range Status  12/12/2013 37.4  36.0 - 46.0 % Final  Hospital Course  Krystal Zamora is a 33 y.o. [id number] at [redacted]w[redacted]d who presents to maternity admissions on 12/11/13 at 2200 with complaints of leakage of fluid and was found to have PPROM therefore was admitted for IOL. She delivered a viable female with birth Weight: 6 lb 14.4 oz (3130 g) and APGAR: 8, 9 via NSVD. No lacerations noted. EBL 100. She has been bottle feeding. Desires inpatient circumcision. Husband plans to have vasectomy performed for contraception and will use condoms in the interim.    Physical Exam:  General: alert, cooperative and no distress Lochia: appropriate Uterine Fundus: firm Incision: n/a DVT Evaluation: No evidence of DVT seen on physical exam.  Discharge Diagnoses: Premature labor  Discharge Information: Date: 12/15/2013 Activity: unrestricted and pelvic rest Diet: routine Medications: Ibuprofen Condition: stable Instructions: refer to practice specific booklet Discharge to: home Follow-up Information   [facility] up with [facility] In 5 weeks.   Specialty:  Obstetrics and Gynecology   Contact information:   [address] [place] [address] [phone]      Newborn Data: Live born female  Birth Weight: 6 lb 14.4 oz (3130 g) APGAR: 8, 9  Home with mother.  Tomma Rakers, MD, PGY3 12/15/2013, 6:47 AM

## 2013-12-16 ENCOUNTER — Encounter: Payer: Self-pay | Admitting: Obstetrics & Gynecology

## 2013-12-17 ENCOUNTER — Encounter: Payer: BC Managed Care – PPO | Admitting: Obstetrics & Gynecology

## 2013-12-18 ENCOUNTER — Ambulatory Visit (HOSPITAL_COMMUNITY): Admission: RE | Admit: 2013-12-18 | Payer: BC Managed Care – PPO | Source: Ambulatory Visit

## 2013-12-21 NOTE — Discharge Summary (Signed)
I saw and evaluated the patient. I agree with the findings and the plan of care as documented in the resident's note

## 2013-12-24 ENCOUNTER — Telehealth: Payer: Self-pay | Admitting: *Deleted

## 2013-12-24 MED ORDER — AZITHROMYCIN 250 MG PO TABS: ORAL_TABLET | ORAL | Status: DC

## 2013-12-24 NOTE — Telephone Encounter (Signed)
Patient is requesting a refill of azithromycin to take for diagnosis of chlamydia.  She did take it the first time but has had contact with her partner again and will be sure that they are both treated before resuming further contact.

## 2014-01-22 ENCOUNTER — Ambulatory Visit (INDEPENDENT_AMBULATORY_CARE_PROVIDER_SITE_OTHER): Payer: BC Managed Care – PPO | Admitting: Obstetrics & Gynecology

## 2014-01-22 ENCOUNTER — Encounter: Payer: Self-pay | Admitting: Obstetrics & Gynecology

## 2014-01-22 VITALS — BP 94/71 | HR 67 | Ht 67.0 in | Wt 231.0 lb

## 2014-01-22 DIAGNOSIS — Z23 Encounter for immunization: Secondary | ICD-10-CM | POA: Diagnosis not present

## 2014-01-22 DIAGNOSIS — Z0000 Encounter for general adult medical examination without abnormal findings: Secondary | ICD-10-CM

## 2014-01-22 MED ORDER — FLUOXETINE HCL 20 MG PO TABS: 20.0000 mg | ORAL_TABLET | Freq: Every day | ORAL | Status: DC

## 2014-01-22 NOTE — Progress Notes (Signed)
   Subjective:    Patient ID: Krystal Zamora, female    DOB: 09/10/1980, 33 y.o.   MRN: [id number]  HPI  33 yo MW P3 here for her 6 weeks pp visit. Her baby was born via NSVD with no tears at 36 weeks after PPROM. She reports that the baby is growing well with bottlefeeding. He is now 9 1/2 #. He is waking up about two times per night. Her husband has changed his mind about a vasectomy.  She had a Mirena but didn't like it and had it removed after 2 months. She would like Nuvaring, but due to her baby blues, I am not recommending that method. She was treated for depression in the past with Zoloft but she stopped it after about 3 weeks because it was making her "tired". She says that her depression is "manageable". 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 She does not want more kids but doesn't want to take more time off of work to get her tubes tied. She has had sex last week with no dyspareunia and used withdrawal for contraception. She is uncertain what she wants at this moment for contraception. I offered the Paragard.  Review of Systems     Objective:   Physical Exam        Assessment & Plan:  Depression-prozac 20 mg q am RTC 4 weeks Rec condoms until she decided about contraception Flu vaccine today

## 2014-02-01 ENCOUNTER — Encounter: Payer: Self-pay | Admitting: Obstetrics & Gynecology

## 2014-02-02 ENCOUNTER — Ambulatory Visit (INDEPENDENT_AMBULATORY_CARE_PROVIDER_SITE_OTHER): Payer: BC Managed Care – PPO | Admitting: Obstetrics & Gynecology

## 2014-02-02 ENCOUNTER — Encounter: Payer: Self-pay | Admitting: Obstetrics & Gynecology

## 2014-02-02 VITALS — BP 128/89 | HR 64 | Ht 67.0 in | Wt 233.6 lb

## 2014-02-02 DIAGNOSIS — Z30430 Encounter for insertion of intrauterine contraceptive device: Secondary | ICD-10-CM | POA: Diagnosis not present

## 2014-02-02 MED ORDER — PARAGARD INTRAUTERINE COPPER IU IUD: 1.0000 [IU] | INTRAUTERINE_SYSTEM | Freq: Once | INTRAUTERINE | Status: DC

## 2014-02-02 NOTE — Patient Instructions (Signed)

## 2014-02-02 NOTE — Progress Notes (Signed)
    GYNECOLOGY CLINIC PROCEDURE NOTE  Krystal Zamora is a 33 y.o. [phone] here for Paragard IUD insertion. No GYN concerns.  Last pap smear was on 06/12/13 and was normal with negative HRHPV.  IUD Insertion Procedure Note Patient identified, informed consent performed.  Discussed risks of irregular bleeding, cramping, infection, malpositioning or misplacement of the IUD outside the uterus which may require further procedure such as laparoscopy. Time out was performed.  Urine pregnancy test negative.  Speculum placed in the vagina.  Cervix visualized.  Cleaned with Betadine x 2.  Grasped anteriorly with a single tooth tenaculum.  Uterus sounded to 9 cm.  Paragard IUD placed per manufacturer's recommendations.  Strings trimmed to 3 cm. Tenaculum was removed, good hemostasis noted.  Patient tolerated procedure well.   Patient was given post-procedure instructions.  She was advised to have backup contraception for one week.  Patient was also asked to check IUD strings periodically and follow up in 4 weeks for IUD check.   Jaynie CollinsUGONNA  ANYANWU, MD, FACOG Attending Obstetrician & Gynecologist Center for [facility], [facility]

## 2014-02-02 NOTE — Progress Notes (Signed)
Would like Paragard  IUD today.  Taking her Prozac everyday, has not noticed a difference yet, but she knows it will take time for the medicine to get into her system before we can tell if its going to help.

## 2014-03-02 ENCOUNTER — Encounter: Payer: Self-pay | Admitting: Obstetrics & Gynecology

## 2014-03-02 ENCOUNTER — Ambulatory Visit (INDEPENDENT_AMBULATORY_CARE_PROVIDER_SITE_OTHER): Payer: BC Managed Care – PPO | Admitting: Obstetrics & Gynecology

## 2014-03-02 VITALS — BP 134/90 | HR 81 | Wt 234.0 lb

## 2014-03-02 DIAGNOSIS — N898 Other specified noninflammatory disorders of vagina: Secondary | ICD-10-CM

## 2014-03-02 DIAGNOSIS — N9489 Other specified conditions associated with female genital organs and menstrual cycle: Secondary | ICD-10-CM | POA: Diagnosis not present

## 2014-03-02 DIAGNOSIS — Z30431 Encounter for routine checking of intrauterine contraceptive device: Secondary | ICD-10-CM

## 2014-03-02 MED ORDER — METRONIDAZOLE 500 MG PO TABS: 500.0000 mg | ORAL_TABLET | Freq: Two times a day (BID) | ORAL | Status: DC

## 2014-03-02 NOTE — Progress Notes (Signed)
   Subjective:    Patient ID: Krystal Zamora, female    DOB: 10/08/1980, 33 y.o.   MRN: [id number]  HPI  Richarda OsmondMollie is here for a string check and for medication follow up. She tried the prozac for 3-4 but did not notice any change so she stopped taking it. She no longer feels depressed and declines any other treatment.  She also complains of a vaginal odor.  She would like to have a BTL (BS) in January when she can take time off of work.   Review of Systems     Objective:   Physical Exam  Strings seen BV-type odor noted       Assessment & Plan:  Probable BV- treat with flagyl Contraception- Mirena Per her request, I will send [place] an email to schedule a laparoscopic BS.

## 2014-04-09 ENCOUNTER — Encounter (HOSPITAL_COMMUNITY): Admission: RE | Payer: Self-pay | Source: Ambulatory Visit

## 2014-04-09 ENCOUNTER — Ambulatory Visit (HOSPITAL_COMMUNITY)
Admission: RE | Admit: 2014-04-09 | Payer: BC Managed Care – PPO | Source: Ambulatory Visit | Admitting: Obstetrics & Gynecology

## 2014-04-09 SURGERY — SALPINGECTOMY, BILATERAL, LAPAROSCOPIC
Anesthesia: Choice | Site: Abdomen | Laterality: Bilateral

## 2014-09-08 ENCOUNTER — Encounter: Payer: Self-pay | Admitting: Obstetrics & Gynecology

## 2014-09-08 ENCOUNTER — Ambulatory Visit (INDEPENDENT_AMBULATORY_CARE_PROVIDER_SITE_OTHER): Payer: Self-pay | Admitting: Obstetrics & Gynecology

## 2014-09-08 VITALS — BP 129/88 | HR 89 | Ht 67.0 in | Wt 218.0 lb

## 2014-09-08 DIAGNOSIS — F418 Other specified anxiety disorders: Secondary | ICD-10-CM

## 2014-09-08 DIAGNOSIS — F53 Puerperal psychosis: Secondary | ICD-10-CM

## 2014-09-08 MED ORDER — PAROXETINE HCL 20 MG PO TABS: 20.0000 mg | ORAL_TABLET | Freq: Every day | ORAL | Status: DC

## 2014-09-08 NOTE — Progress Notes (Signed)
   CLINIC ENCOUNTER NOTE  History:  34 y.o. [id number] here today for discussion about medical management of depression.  Reports having no response to Prozac, wants to try another medication. Also reports having anxiety symptoms. Denies HI/SI or other symptoms. Not breastfeeding. Baby is doing well.  Past Medical History  Diagnosis Date  . Hepatitis C virus 2004    s/p one month interferon treatment   . Hx of abnormal Pap smear 1997    s/p LEEP procedure.  . Hepatitis C   . Aspiration of vomit 2004  . 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 Chlamydia 12/11/13    During pregnancy, diagnosed at 1564w4d, PPROM on 3674w5d  . Obesity     Past Surgical History  Procedure Laterality Date  . Aspiration biopsy      in ICU for aspiration of vomit for 7 weeks  . Liver biopsy  2009  . Wisdom tooth extraction      The following portions of the patient's history were reviewed and updated as appropriate: allergies, current medications, past family history, past medical history, past social history, past surgical history and problem list.   Health Maintenance:  Normal pap and negative HRHPV on 06/12/13.    Review of Systems:  Behavioral/Psych: positive for anxiety, bad mood, depression, fatigue, mood swings and sleep disturbance Comprehensive review of systems was otherwise negative.  Objective:  Physical Exam BP 129/88 mmHg  Pulse 89  Ht 5\' 7"  (1.702 m)  Wt 218 lb (98.884 kg)  BMI 34.14 kg/m2  LMP 09/01/2014 (Exact Date)  Breastfeeding? No CONSTITUTIONAL: Well-developed, well-nourished female in no acute distress.  HENT:  Normocephalic, atraumatic, External right and left ear normal. Oropharynx is clear and moist EYES: Conjunctivae and EOM are normal. Pupils are equal, round, and reactive to light. No scleral icterus.  NECK: Normal range of motion, supple, no masses SKIN: Skin is warm and dry. No rash noted. Not diaphoretic. No erythema. No pallor. NEUROLGIC: Alert and  oriented to person, place, and time. Normal reflexes, muscle tone coordination. No cranial nerve deficit noted. PSYCHIATRIC: Normal mood and affect. Normal behavior. Normal judgment and thought content. CARDIOVASCULAR: Normal heart rate noted RESPIRATORY: Effort and breath sounds normal, no problems with respiration noted ABDOMEN: Soft, no distention noted.  No tenderness, rebound or guarding.  PELVIC: Deferred MUSCULOSKELETAL: Normal range of motion. No edema and no tenderness.   Assessment & Plan:  Paroxetine prescribed for treatment of depression and anxiety. Instructed patient on how to titrate dosage if needed.  She will follow up as needed. HI/SI precautions reviewed. Declines referral to mental health provider for now. Routine preventative health maintenance measures emphasized. Please refer to After Visit Summary for other counseling recommendations.    Jaynie CollinsUGONNA  Krystal Yaklin, MD, FACOG Attending Obstetrician & Gynecologist Center for [facility], [facility]

## 2014-09-10 ENCOUNTER — Ambulatory Visit: Payer: Medicaid Other | Admitting: Obstetrics & Gynecology

## 2014-10-15 ENCOUNTER — Encounter: Payer: Self-pay | Admitting: Obstetrics & Gynecology

## 2014-10-15 ENCOUNTER — Ambulatory Visit (INDEPENDENT_AMBULATORY_CARE_PROVIDER_SITE_OTHER): Payer: Self-pay | Admitting: Obstetrics & Gynecology

## 2014-10-15 VITALS — BP 125/92 | HR 86 | Ht 67.0 in | Wt 218.0 lb

## 2014-10-15 DIAGNOSIS — F419 Anxiety disorder, unspecified: Secondary | ICD-10-CM | POA: Insufficient documentation

## 2014-10-15 DIAGNOSIS — F418 Other specified anxiety disorders: Secondary | ICD-10-CM

## 2014-10-15 DIAGNOSIS — F32A Depression, unspecified: Secondary | ICD-10-CM | POA: Insufficient documentation

## 2014-10-15 MED ORDER — BUSPIRONE HCL 10 MG PO TABS: 10.0000 mg | ORAL_TABLET | Freq: Two times a day (BID) | ORAL | Status: DC

## 2014-10-15 NOTE — Progress Notes (Signed)
Here today for medication consult.  The Paxil makes her listless and wants to discuss other options.

## 2014-10-15 NOTE — Progress Notes (Signed)
   CLINIC ENCOUNTER NOTE  History:  34 y.o. [id number] here today for medication management for her anxiety and depression.  Was prescribed Paxil last visit, feels it helped her depression symptoms but made her listless and did not help much with anxiety.  Wants to discuss other options.  She denies any abnormal vaginal discharge, bleeding, pelvic pain or other concerns.   Past Medical History  Diagnosis Date  . Hepatitis C virus 2004    s/p one month interferon treatment   . Hx of abnormal Pap smear 1997    s/p LEEP procedure.  . Hepatitis C   . Aspiration of vomit 2004  . 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 Chlamydia 12/11/13    During pregnancy, diagnosed at 5953w4d, PPROM on 3357w5d  . Obesity     Past Surgical History  Procedure Laterality Date  . Aspiration biopsy      in ICU for aspiration of vomit for 7 weeks  . Liver biopsy  2009  . Wisdom tooth extraction      The following portions of the patient's history were reviewed and updated as appropriate: allergies, current medications, past family history, past medical history, past social history, past surgical history and problem list.   Health Maintenance:  Normal pap and negative HRHPV on 06/12/13.    Review of Systems:  Behavioral/Psych: positive for anxiety, fatigue, increased appetite and mood swings Comprehensive review of systems was otherwise negative.  Objective:  Physical Exam BP 125/92 mmHg  Pulse 86  Ht 5\' 7"  (1.702 m)  Wt 218 lb (98.884 kg)  BMI 34.14 kg/m2  LMP 09/24/2014  Breastfeeding? No CONSTITUTIONAL: Well-developed, well-nourished female in no acute distress.  HENT:  Normocephalic, atraumatic. External right and left ear normal. Oropharynx is clear and moist EYES: Conjunctivae and EOM are normal. Pupils are equal, round, and reactive to light. No scleral icterus.  NECK: Normal range of motion, supple, no masses SKIN: Skin is warm and dry. No rash noted. Not diaphoretic. No erythema.  No pallor. NEUROLGIC: Alert and oriented to person, place, and time. Normal reflexes, muscle tone coordination. No cranial nerve deficit noted. PSYCHIATRIC: Normal mood and affect. Normal behavior. Normal judgment and thought content. CARDIOVASCULAR: Normal heart rate noted RESPIRATORY: Effort and breath sounds normal, no problems with respiration noted ABDOMEN: Soft, no distention noted.   PELVIC: Deferred MUSCULOSKELETAL: Normal range of motion. No edema noted.   Assessment & Plan:  Will add Buspirone to help with anxiety, common side effect is agitation.  Hopefully this will counteract Paxil's side effects.  Patient was told that this is not our specialty and I am not comfortable making further medications changes; she will need to see a mental health provider if she desires further medical management. She agrees to this plan.  - busPIRone (BUSPAR) 10 MG tablet; Take 1 tablet (10 mg total) by mouth 2 (two) times daily.  Dispense: 60 tablet; Refill: 2 Routine preventative health maintenance measures emphasized.  Total face-to-face time with patient: 25 minutes. 100% of encounter was spent on counseling and coordination of care.   Krystal CollinsUGONNA  Kehaulani Fruin, MD, FACOG Attending Obstetrician & Gynecologist Center for [facility], [facility]

## 2015-05-10 ENCOUNTER — Encounter: Payer: Self-pay | Admitting: Internal Medicine

## 2015-05-10 ENCOUNTER — Ambulatory Visit (INDEPENDENT_AMBULATORY_CARE_PROVIDER_SITE_OTHER): Payer: Managed Care, Other (non HMO) | Admitting: Internal Medicine

## 2015-05-10 VITALS — BP 128/88 | HR 98 | Temp 98.2°F | Ht 66.5 in | Wt 234.0 lb

## 2015-05-10 DIAGNOSIS — R635 Abnormal weight gain: Secondary | ICD-10-CM | POA: Diagnosis not present

## 2015-05-10 DIAGNOSIS — R21 Rash and other nonspecific skin eruption: Secondary | ICD-10-CM | POA: Diagnosis not present

## 2015-05-10 DIAGNOSIS — B182 Chronic viral hepatitis C: Secondary | ICD-10-CM | POA: Diagnosis not present

## 2015-05-10 MED ORDER — TRIAMCINOLONE ACETONIDE 0.1 % EX CREA: 1.0000 "application " | TOPICAL_CREAM | Freq: Two times a day (BID) | CUTANEOUS | Status: DC

## 2015-05-10 MED ORDER — PHENTERMINE HCL 37.5 MG PO CAPS: 37.5000 mg | ORAL_CAPSULE | ORAL | Status: DC

## 2015-05-10 NOTE — Progress Notes (Signed)
Pre visit review using our clinic review tool, if applicable. No additional management support is needed unless otherwise documented below in the visit note. 

## 2015-05-10 NOTE — Patient Instructions (Signed)
Hepatitis C  Hepatitis C is a viral infection of the liver. It can lead to scarring of the liver (cirrhosis), liver failure, or liver cancer. Hepatitis C may go undetected for months or years because people with the infection may not have symptoms, or they may have only mild symptoms.  CAUSES   Hepatitis C is caused by the hepatitis C virus (HCV). The virus can be passed from one person to another through:   Blood.   Contaminated needles, such as those used for tattooing, body piercing, acupuncture, or injecting drugs.   Having unprotected sex with an infected person.   Childbirth.   Blood transfusions or organ transplants done in the [place] before 1992.  RISK FACTORS  Risk factors for hepatitis C include:   Having unprotected sex with an infected person.   Using illegal drugs.  SIGNS AND SYMPTOMS   Symptoms of hepatitis C may include:   Fatigue.   Loss of appetite.   Nausea.   Vomiting.   Abdominal pain.   Dark yellow urine.   Yellowish skin and eyes (jaundice).   Itching of the skin.   Clay-colored bowel movements.   Joint pain.  Symptoms are not always present.   DIAGNOSIS   Hepatitis C is diagnosed with blood tests. Other types of tests may also be done to check how your liver is functioning.  TREATMENT   Your health care provider may perform noninvasive tests or a liver biopsy to help determine the best course of treatment. Treatment for hepatitis C may include one or more medicines. Your health care provider may check you for a recurring infection or other liver conditions every 6-12 months after treatment.  HOME CARE INSTRUCTIONS    Rest as needed.   Take all medicines as directed by your health care provider.   Do not take any medicine unless approved by your health care provider. This includes over-the-counter medicine and birth control pills.   Do not drink alcohol.   Do not have sex until approved by your health care provider.   Do not share toothbrushes, nail clippers,  razors, or needles.  PREVENTION  There is no vaccine for hepatitis C. The only way to prevent the disease is to reduce the risk of exposure to the virus. This may be done by:   Practicing safe sex and using condoms.   Avoiding illegal drugs.  SEEK MEDICAL CARE IF:   You have a fever.   You develop abdominal pain.   You develop dark urine.   You have clay-colored bowel movements.   You develop joint pains.  SEEK IMMEDIATE MEDICAL CARE IF:   You have increasing fatigue or weakness.   You lose your appetite.   You feel nauseous or vomit.   You develop jaundice or your jaundice gets worse.   You bruise or bleed easily.  MAKE SURE YOU:    Understand these instructions.   Will watch your condition.   Will get help right away if you are not doing well or get worse.     This information is not intended to replace advice given to you by your health care provider. Make sure you discuss any questions you have with your health care provider.     Document Released: 03/16/2000 Document Revised: 04/09/2014 Document Reviewed: 07/01/2013  Elsevier Interactive Patient Education 2016 Elsevier Inc[employer]

## 2015-05-10 NOTE — Progress Notes (Signed)
HPI Pt presents to the clinic today to establish care and for management of the conditions listed below. She has not had a PCP in many years.  Hep C: Diagnosed in 2004. Previous history of IV drug use. Sober since detox in 2004. She had a liver biopsy in 2009. She did do 1 month of Interferon, but was not able to tolerate it.  Obesity: She reports she has not been able to lose weight after the birth of her second child. Her weight today is 234 lbs, BMI of 37.20. She was on Phentermine in the past and would like to restart it. She is not currently exercising.  Rash: She reports she has had this rash on her arms for years. The rash is scaly but does not itch or burn. She has tried putting OTC Hydrocortisone cream on it but it does not make it go away.  She is not sure if she needs a referral to a dermatologist.  Past Medical History  Diagnosis Date  . Hepatitis C virus 2004    s/p one month interferon treatment   . Hx of abnormal Pap smear 1997    s/p LEEP procedure.  . Hepatitis C   . Aspiration of vomit 2004  . Drug addiction in remission (HCC)     detox. of herion  . Chlamydia 12/11/13    During pregnancy, diagnosed at [redacted]w[redacted]d, PPROM on [redacted]w[redacted]d  . Obesity     Current Outpatient Prescriptions  Medication Sig Dispense Refill  . phentermine 37.5 MG capsule Take 37.5 mg by mouth every morning. Reported on 05/10/2015     Current Facility-Administered Medications  Medication Dose Route Frequency Provider Last Rate Last Dose  . PARAGARD INTRAUTERINE COPPER IUD 1 Units  1 Units Intrauterine Once Tereso Newcomer, MD        Allergies  Allergen Reactions  . Sulfa Antibiotics Hives    Family History  Problem Relation Age of Onset  . Adopted: Yes  . Other Neg Hx     Social History   Social History  . Marital Status: Married    Spouse Name: N/A  . Number of Children: N/A  . Years of Education: N/A   Occupational History  . Not on file.   Social History Main Topics  . Smoking  status: Current Every Day Smoker    Types: Cigarettes    Last Attempt to Quit: 12/12/2005  . Smokeless tobacco: Never Used     Comment: 2 cigarettes a day  . Alcohol Use: 0.0 oz/week    0 Standard drinks or equivalent per week     Comment: no heroin use since 2004 after detox. occasional alcohol use  . Drug Use: No  . Sexual Activity:    Partners: Male    Birth Control/ Protection: Coitus interruptus, IUD   Other Topics Concern  . Not on file   Social History Narrative    ROS:  Constitutional: Denies fever, malaise, fatigue, headache or abrupt weight changes.  Respiratory: Denies difficulty breathing, shortness of breath, cough or sputum production.   Cardiovascular: Denies chest pain, chest tightness, palpitations or swelling in the hands or feet.  Gastrointestinal: Denies abdominal pain, bloating, constipation, diarrhea or blood in the stool.  Skin: Pt reports rash. Denies redness or ulcercations.  Neurological: Denies dizziness, difficulty with memory, difficulty with speech or problems with balance and coordination.  Psych: Denies anxiety, depression, SI/HI.  No other specific complaints in a complete review of systems (except as listed in HPI above).  PE:  BP 128/88 mmHg  Pulse 98  Temp(Src) 98.2 F (36.8 C) (Oral)  Ht 5' 6.5" (1.689 m)  Wt 234 lb (106.142 kg)  BMI 37.21 kg/m2  SpO2 98%  LMP 04/19/2015 Wt Readings from Last 3 Encounters:  05/10/15 234 lb (106.142 kg)  10/15/14 218 lb (98.884 kg)  09/08/14 218 lb (98.884 kg)    General: Appears her stated age, obese in NAD. Skin: Scattered, round scaly lesions noted on bilateral forearms. Cardiovascular: Normal rate and rhythm. S1,S2 noted.  No murmur, rubs or gallops noted.  Pulmonary/Chest: Normal effort and positive vesicular breath sounds. No respiratory distress. No wheezes, rales or ronchi noted.  Abdomen: Soft and nontender. Normal bowel sounds. No distention or masses noted. Liver, spleen and kidneys  non palpable. Neurological: Alert and oriented. Cranial nerves II-XII grossly intact. Coordination normal.  Psychiatric: Mood and affect normal. Behavior is normal. Judgment and thought content normal.     BMET    Component Value Date/Time   NA 135* 10/27/2013 1453   K 3.6* 10/27/2013 1453   CL 101 10/27/2013 1453   CO2 22 10/27/2013 1453   GLUCOSE 94 10/27/2013 1453   BUN 9 10/27/2013 1453   CREATININE 0.40* 10/27/2013 1453   CREATININE 0.59 11/29/2011 1621   CALCIUM 9.3 10/27/2013 1453   GFRNONAA >90 10/27/2013 1453   GFRNONAA >89 11/29/2011 1621   GFRAA >90 10/27/2013 1453   GFRAA >89 11/29/2011 1621    Lipid Panel  No results found for: CHOL, TRIG, HDL, CHOLHDL, VLDL, LDLCALC  CBC    Component Value Date/Time   WBC 10.5 12/12/2013 1030   RBC 3.99 12/12/2013 1030   HGB 13.5 12/12/2013 1030   HCT 37.4 12/12/2013 1030   PLT 239 12/12/2013 1030   MCV 93.7 12/12/2013 1030   MCH 33.8 12/12/2013 1030   MCHC 36.1* 12/12/2013 1030   RDW 12.8 12/12/2013 1030   LYMPHSABS 2.8 10/27/2013 1453   MONOABS 0.8 10/27/2013 1453   EOSABS 0.4 10/27/2013 1453   BASOSABS 0.0 10/27/2013 1453    Hgb A1C No results found for: HGBA1C   Assessment and Plan:  Abnormal Weight Gain:  eRx for Phentermine 37.5 mg daily Will treat for 6 months Discussed importance of diet and exercise  Rash:  eRx for Triamcinolone cream to affected area BID If no improvement, consider referral to derm for further evaluation  RTC in 1 month for weight check and med refill

## 2015-05-10 NOTE — Assessment & Plan Note (Signed)
Will check HCV RNA quant today Will refer to [facility], pending labs Avoid ETOH and Tyelnol

## 2015-05-11 LAB — HCV RNA QUANT RFLX ULTRA OR GENOTYP
HCV Quantitative Log: 5.88 {Log} — ABNORMAL HIGH (ref <1.18)
HCV Quantitative: 762165 IU/mL — ABNORMAL HIGH (ref <15)

## 2015-05-17 LAB — HEPATITIS C GENOTYPE

## 2015-05-19 ENCOUNTER — Telehealth: Payer: Self-pay | Admitting: Internal Medicine

## 2015-05-19 NOTE — Telephone Encounter (Signed)
See result note.  

## 2015-05-19 NOTE — Telephone Encounter (Signed)
Patient returned Melanie's call. °

## 2015-05-20 ENCOUNTER — Other Ambulatory Visit: Payer: Self-pay | Admitting: Internal Medicine

## 2015-05-20 DIAGNOSIS — B182 Chronic viral hepatitis C: Secondary | ICD-10-CM

## 2015-06-07 ENCOUNTER — Ambulatory Visit (INDEPENDENT_AMBULATORY_CARE_PROVIDER_SITE_OTHER): Payer: Managed Care, Other (non HMO) | Admitting: Internal Medicine

## 2015-06-07 ENCOUNTER — Encounter: Payer: Self-pay | Admitting: Internal Medicine

## 2015-06-07 VITALS — BP 124/84 | HR 74 | Temp 97.5°F | Wt 224.0 lb

## 2015-06-07 DIAGNOSIS — R635 Abnormal weight gain: Secondary | ICD-10-CM | POA: Diagnosis not present

## 2015-06-07 MED ORDER — PHENTERMINE HCL 37.5 MG PO CAPS: 37.5000 mg | ORAL_CAPSULE | ORAL | Status: DC

## 2015-06-07 NOTE — Patient Instructions (Signed)

## 2015-06-07 NOTE — Progress Notes (Signed)
Subjective:    Patient ID: Krystal Zamora, female    DOB: 08/11/1980, 35 y.o.   MRN: [id number]  HPI  Pt presents to the clinic today for 1 month follow up of abnormal weight gain. She was started on Phentermine at her last visit. Her starting weight was 234 lbs with a BMI of 37.21. She has been taking the Phentermine as prescribed. She denies adverse effects. Her weight today is 224 lbs with a BMI of 35.61.  Breakfast: eggs and a protein bar Lunch: protein bar Dinner: lean protein, carb and veggies Snacks: fruit, drinking 6-8 glasses of water per day  Exercise: 20-30 minutes daily, [facility]ore[facility]illian Michaels  Review of Systems  Past Medical History  Diagnosis Date  . Hepatitis C virus 2004    s/p one month interferon treatment   . Hx of abnormal Pap smear 1997    s/p LEEP procedure.  . Hepatitis C   . Aspiration of vomit 2004  . Drug addiction in remission (HCC)     detox. of herion  . Chlamydia 12/11/13    During pregnancy, diagnosed at 1021w4d, PPROM on 271w5d  . Obesity     Current Outpatient Prescriptions  Medication Sig Dispense Refill  . phentermine 37.5 MG capsule Take 1 capsule (37.5 mg total) by mouth every morning. Reported on 05/10/2015 30 capsule 0  . triamcinolone cream (KENALOG) 0.1 % Apply 1 application topically 2 (two) times daily. 30 g 0   Current Facility-Administered Medications  Medication Dose Route Frequency Provider Last Rate Last Dose  . PARAGARD INTRAUTERINE COPPER IUD 1 Units  1 Units Intrauterine Once Tereso NewcomerUgonna A Anyanwu, MD        Allergies  Allergen Reactions  . Sulfa Antibiotics Hives    Family History  Problem Relation Age of Onset  . Adopted: Yes  . Other Neg Hx     Social History   Social History  . Marital Status: Married    Spouse Name: N/A  . Number of Children: N/A  . Years of Education: N/A   Occupational History  . Not on file.   Social History Main Topics  . Smoking status: Current Every Day Smoker -- 0.10 packs/day for 1 years      Types: Cigarettes    Last Attempt to Quit: 12/12/2005  . Smokeless tobacco: Never Used     Comment: 2 cigarettes a day  . Alcohol Use: 0.0 oz/week    0 Standard drinks or equivalent per week     Comment: no heroin use since 2004 after detox. occasional alcohol use  . Drug Use: No  . Sexual Activity:    Partners: Male    Birth Control/ Protection: IUD   Other Topics Concern  . Not on file   Social History Narrative     Constitutional: Denies fever, malaise, fatigue, headache or abrupt weight changes.  Respiratory: Denies difficulty breathing, shortness of breath, cough or sputum production.   Cardiovascular: Denies chest pain, chest tightness, palpitations or swelling in the hands or feet.  Neurological: Denies dizziness, difficulty with memory, difficulty with speech or problems with balance and coordination.    No other specific complaints in a complete review of systems (except as listed in HPI above).     Objective:   Physical Exam  BP 124/84 mmHg  Pulse 74  Temp(Src) 97.5 F (36.4 C) (Oral)  Wt 224 lb (101.606 kg)  SpO2 98%  LMP 05/25/2015 Wt Readings from Last 3 Encounters:  06/07/15 224 lb (101.606 kg)  05/10/15 234 lb (106.142 kg)  10/15/14 218 lb (98.884 kg)    General: Appears her stated age, obese in NAD. Cardiovascular: Normal rate and rhythm. S1,S2 noted.  No murmur, rubs or gallops noted.  Pulmonary/Chest: Normal effort and positive vesicular breath sounds. No respiratory distress. No wheezes, rales or ronchi noted.  Neurological: Alert and oriented.    BMET    Component Value Date/Time   NA 135* 10/27/2013 1453   K 3.6* 10/27/2013 1453   CL 101 10/27/2013 1453   CO2 22 10/27/2013 1453   GLUCOSE 94 10/27/2013 1453   BUN 9 10/27/2013 1453   CREATININE 0.40* 10/27/2013 1453   CREATININE 0.59 11/29/2011 1621   CALCIUM 9.3 10/27/2013 1453   GFRNONAA >90 10/27/2013 1453   GFRNONAA >89 11/29/2011 1621   GFRAA >90 10/27/2013 1453   GFRAA >89  11/29/2011 1621    Lipid Panel  No results found for: CHOL, TRIG, HDL, CHOLHDL, VLDL, LDLCALC  CBC    Component Value Date/Time   WBC 10.5 12/12/2013 1030   RBC 3.99 12/12/2013 1030   HGB 13.5 12/12/2013 1030   HCT 37.4 12/12/2013 1030   PLT 239 12/12/2013 1030   MCV 93.7 12/12/2013 1030   MCH 33.8 12/12/2013 1030   MCHC 36.1* 12/12/2013 1030   RDW 12.8 12/12/2013 1030   LYMPHSABS 2.8 10/27/2013 1453   MONOABS 0.8 10/27/2013 1453   EOSABS 0.4 10/27/2013 1453   BASOSABS 0.0 10/27/2013 1453    Hgb A1C No results found for: HGBA1C       Assessment & Plan:   Abnormal Weight Gain:  Discussed making sure she is getting at least 1200 calories per day- she will start logging her food Diet seems to be going well Try to increase the exercise, add light weights RX for Phentermine provided today  RTC in 1 month for weight check/med refill

## 2015-06-07 NOTE — Progress Notes (Signed)
Pre visit review using our clinic review tool, if applicable. No additional management support is needed unless otherwise documented below in the visit note. 

## 2015-06-20 ENCOUNTER — Other Ambulatory Visit (HOSPITAL_COMMUNITY): Payer: Self-pay | Admitting: Nurse Practitioner

## 2015-06-20 DIAGNOSIS — B182 Chronic viral hepatitis C: Secondary | ICD-10-CM

## 2015-06-27 ENCOUNTER — Ambulatory Visit: Payer: Managed Care, Other (non HMO)

## 2015-06-30 ENCOUNTER — Ambulatory Visit (INDEPENDENT_AMBULATORY_CARE_PROVIDER_SITE_OTHER): Payer: Managed Care, Other (non HMO) | Admitting: Internal Medicine

## 2015-06-30 ENCOUNTER — Encounter: Payer: Self-pay | Admitting: Internal Medicine

## 2015-06-30 VITALS — BP 128/82 | HR 106 | Temp 97.7°F | Wt 220.0 lb

## 2015-06-30 DIAGNOSIS — R635 Abnormal weight gain: Secondary | ICD-10-CM

## 2015-06-30 MED ORDER — PHENTERMINE HCL 37.5 MG PO CAPS: 37.5000 mg | ORAL_CAPSULE | ORAL | Status: DC

## 2015-06-30 NOTE — Progress Notes (Signed)
Pre visit review using our clinic review tool, if applicable. No additional management support is needed unless otherwise documented below in the visit note. 

## 2015-06-30 NOTE — Patient Instructions (Signed)

## 2015-06-30 NOTE — Progress Notes (Signed)
Subjective:    Patient ID: Krystal Zamora, female    DOB: 12/26/1980, 35 y.o.   MRN: [id number]  HPI  Pt presents to the clinic today for 1 month follow up of abnormal weight gain. She was started on Phentermine 05/2015. Her starting weight was 234 lbs with a BMI of 37.21. She has been taking the Phentermine more than prescribed, 1 tablet in the morning and 1/2 tablet in the afternoon. She does not feel like it is helping with her energy level or suppressing her appetite. She denies adverse effects. Her weight today is 220 lbs with a BMI of 34.98.  She has been under a lot of stress lately and feels like this may be contributing to her difficulty losing weight.  Breakfast: eggs and a smoothie Lunch: sandwich Dinner: lean protein, carb and veggies Snacks: fruit, drinking 6-8 glasses of water per day  Exercise: She has been doing low impact videos 4-5 days a week.  Review of Systems  Past Medical History  Diagnosis Date  . Hepatitis C virus 2004    s/p one month interferon treatment   . Hx of abnormal Pap smear 1997    s/p LEEP procedure.  . Hepatitis C   . Aspiration of vomit 2004  . Drug addiction in remission (HCC)     detox. of herion  . Chlamydia 12/11/13    During pregnancy, diagnosed at 4838w4d, PPROM on 9218w5d  . Obesity     Current Outpatient Prescriptions  Medication Sig Dispense Refill  . phentermine 37.5 MG capsule Take 1 capsule (37.5 mg total) by mouth every morning. Reported on 05/10/2015 30 capsule 0  . triamcinolone cream (KENALOG) 0.1 % Apply 1 application topically 2 (two) times daily. 30 g 0   Current Facility-Administered Medications  Medication Dose Route Frequency Provider Last Rate Last Dose  . PARAGARD INTRAUTERINE COPPER IUD 1 Units  1 Units Intrauterine Once Tereso NewcomerUgonna A Anyanwu, MD        Allergies  Allergen Reactions  . Sulfa Antibiotics Hives    Family History  Problem Relation Age of Onset  . Adopted: Yes  . Other Neg Hx     Social History    Social History  . Marital Status: Married    Spouse Name: N/A  . Number of Children: N/A  . Years of Education: N/A   Occupational History  . Not on file.   Social History Main Topics  . Smoking status: Current Every Day Smoker -- 0.10 packs/day for 1 years    Types: Cigarettes    Last Attempt to Quit: 12/12/2005  . Smokeless tobacco: Never Used     Comment: 2 cigarettes a day  . Alcohol Use: 0.0 oz/week    0 Standard drinks or equivalent per week     Comment: no heroin use since 2004 after detox. occasional alcohol use  . Drug Use: No  . Sexual Activity:    Partners: Male    Birth Control/ Protection: IUD   Other Topics Concern  . Not on file   Social History Narrative     Constitutional: Denies fever, malaise, fatigue, headache or abrupt weight changes.  Respiratory: Denies difficulty breathing, shortness of breath, cough or sputum production.   Cardiovascular: Denies chest pain, chest tightness, palpitations or swelling in the hands or feet.  Neurological: Denies dizziness, difficulty with memory, difficulty with speech or problems with balance and coordination.    No other specific complaints in a complete review of systems (except as listed  in HPI above).     Objective:   Physical Exam  BP 128/82 mmHg  Pulse 106  Temp(Src) 97.7 F (36.5 C) (Oral)  Wt 220 lb (99.791 kg)  SpO2 98%  LMP 06/15/2015  Wt Readings from Last 3 Encounters:  06/30/15 220 lb (99.791 kg)  06/07/15 224 lb (101.606 kg)  05/10/15 234 lb (106.142 kg)    General: Appears her stated age, obese in NAD. Cardiovascular: Tachycardic with normal rhythm. S1,S2 noted.  No murmur, rubs or gallops noted.  Pulmonary/Chest: Normal effort and positive vesicular breath sounds. No respiratory distress. No wheezes, rales or ronchi noted.  Neurological: Alert and oriented.    BMET    Component Value Date/Time   NA 135* 10/27/2013 1453   K 3.6* 10/27/2013 1453   CL 101 10/27/2013 1453   CO2  22 10/27/2013 1453   GLUCOSE 94 10/27/2013 1453   BUN 9 10/27/2013 1453   CREATININE 0.40* 10/27/2013 1453   CREATININE 0.59 11/29/2011 1621   CALCIUM 9.3 10/27/2013 1453   GFRNONAA >90 10/27/2013 1453   GFRNONAA >89 11/29/2011 1621   GFRAA >90 10/27/2013 1453   GFRAA >89 11/29/2011 1621    Lipid Panel  No results found for: CHOL, TRIG, HDL, CHOLHDL, VLDL, LDLCALC  CBC    Component Value Date/Time   WBC 10.5 12/12/2013 1030   RBC 3.99 12/12/2013 1030   HGB 13.5 12/12/2013 1030   HCT 37.4 12/12/2013 1030   PLT 239 12/12/2013 1030   MCV 93.7 12/12/2013 1030   MCH 33.8 12/12/2013 1030   MCHC 36.1* 12/12/2013 1030   RDW 12.8 12/12/2013 1030   LYMPHSABS 2.8 10/27/2013 1453   MONOABS 0.8 10/27/2013 1453   EOSABS 0.4 10/27/2013 1453   BASOSABS 0.0 10/27/2013 1453    Hgb A1C No results found for: HGBA1C       Assessment & Plan:   Abnormal Weight Gain:  Advised her to not take more medication than prescribed- if this happens again, I will no longer prescribe for her Discussed making sure she is getting at least 1200 calories per day- she will start logging her food Diet seems to be going well Try to increase the exercise, add light weights RX for Phentermine provided today  RTC in 1 month for weight check/med refill

## 2015-07-04 ENCOUNTER — Ambulatory Visit
Admission: RE | Admit: 2015-07-04 | Discharge: 2015-07-04 | Disposition: A | Discharge status: Home / Self Care | Payer: Managed Care, Other (non HMO) | Source: Ambulatory Visit | Attending: Nurse Practitioner | Admitting: Nurse Practitioner

## 2015-07-04 DIAGNOSIS — B182 Chronic viral hepatitis C: Secondary | ICD-10-CM | POA: Diagnosis present

## 2015-07-07 ENCOUNTER — Ambulatory Visit: Payer: Managed Care, Other (non HMO) | Admitting: Internal Medicine

## 2015-07-28 ENCOUNTER — Encounter: Payer: Self-pay | Admitting: Internal Medicine

## 2015-07-28 ENCOUNTER — Ambulatory Visit (INDEPENDENT_AMBULATORY_CARE_PROVIDER_SITE_OTHER): Payer: Managed Care, Other (non HMO) | Admitting: Internal Medicine

## 2015-07-28 VITALS — BP 122/84 | HR 82 | Temp 98.3°F | Wt 217.0 lb

## 2015-07-28 DIAGNOSIS — R635 Abnormal weight gain: Secondary | ICD-10-CM | POA: Diagnosis not present

## 2015-07-28 MED ORDER — PHENTERMINE HCL 37.5 MG PO CAPS: 37.5000 mg | ORAL_CAPSULE | ORAL | Status: DC

## 2015-07-28 NOTE — Progress Notes (Signed)
Subjective:    Patient ID: Krystal Zamora, female    DOB: 08-30-1980, 35 y.o.   MRN: [id number]  HPI  Pt presents to the clinic today for 1 month follow up of abnormal weight gain. She was started on Phentermine 05/2015. Her starting weight was 234 lbs with a BMI of 37.21. She has been taking the Phentermine as directed. She denies adverse effects. Her weight today is 217 lbs with a BMI of 34.50.  She plans to cut out alcohol this month.  Breakfast: eggs and a smoothie Lunch: sandwich Dinner: lean protein, carb and veggies Snacks: fruit, drinking 6-8 glasses of water per day  Exercise: She has been doing low impact videos 5-6 days a week.  Review of Systems  Past Medical History  Diagnosis Date  . Hepatitis C virus 2004    s/p one month interferon treatment   . Hx of abnormal Pap smear 1997    s/p LEEP procedure.  . Hepatitis C   . Aspiration of vomit 2004  . Drug addiction in remission (HCC)     detox. of herion  . Chlamydia 12/11/13    During pregnancy, diagnosed at [redacted]w[redacted]d, PPROM on [redacted]w[redacted]d  . Obesity     Current Outpatient Prescriptions  Medication Sig Dispense Refill  . phentermine 37.5 MG capsule Take 1 capsule (37.5 mg total) by mouth every morning. Reported on 05/10/2015 30 capsule 0  . triamcinolone cream (KENALOG) 0.1 % Apply 1 application topically 2 (two) times daily. 30 g 0   Current Facility-Administered Medications  Medication Dose Route Frequency Provider Last Rate Last Dose  . PARAGARD INTRAUTERINE COPPER IUD 1 Units  1 Units Intrauterine Once Tereso Newcomer, MD        Allergies  Allergen Reactions  . Sulfa Antibiotics Hives    Family History  Problem Relation Age of Onset  . Adopted: Yes  . Other Neg Hx     Social History   Social History  . Marital Status: Married    Spouse Name: N/A  . Number of Children: N/A  . Years of Education: N/A   Occupational History  . Not on file.   Social History Main Topics  . Smoking status: Current Every  Day Smoker -- 0.10 packs/day for 1 years    Types: Cigarettes    Last Attempt to Quit: 12/12/2005  . Smokeless tobacco: Never Used     Comment: 2 cigarettes a day  . Alcohol Use: 0.0 oz/week    0 Standard drinks or equivalent per week     Comment: no heroin use since 2004 after detox. occasional alcohol use  . Drug Use: No  . Sexual Activity:    Partners: Male    Birth Control/ Protection: IUD   Other Topics Concern  . Not on file   Social History Narrative     Constitutional: Denies fever, malaise, fatigue, headache or abrupt weight changes.  Respiratory: Denies difficulty breathing, shortness of breath, cough or sputum production.   Cardiovascular: Denies chest pain, chest tightness, palpitations or swelling in the hands or feet.  Neurological: Denies dizziness, difficulty with memory, difficulty with speech or problems with balance and coordination.    No other specific complaints in a complete review of systems (except as listed in HPI above).     Objective:   Physical Exam  BP 122/84 mmHg  Pulse 82  Temp(Src) 98.3 F (36.8 C) (Oral)  Wt 217 lb (98.431 kg)  SpO2 98%  LMP 07/14/2015  Wt Readings  from Last 3 Encounters:  07/28/15 217 lb (98.431 kg)  06/30/15 220 lb (99.791 kg)  06/07/15 224 lb (101.606 kg)    General: Appears her stated age, obese in NAD. Cardiovascular: Normal rate and rhythm. S1,S2 noted.  No murmur, rubs or gallops noted.  Pulmonary/Chest: Normal effort and positive vesicular breath sounds. No respiratory distress. No wheezes, rales or ronchi noted.  Neurological: Alert and oriented.    BMET    Component Value Date/Time   NA 135* 10/27/2013 1453   K 3.6* 10/27/2013 1453   CL 101 10/27/2013 1453   CO2 22 10/27/2013 1453   GLUCOSE 94 10/27/2013 1453   BUN 9 10/27/2013 1453   CREATININE 0.40* 10/27/2013 1453   CREATININE 0.59 11/29/2011 1621   CALCIUM 9.3 10/27/2013 1453   GFRNONAA >90 10/27/2013 1453   GFRNONAA >89 11/29/2011 1621     GFRAA >90 10/27/2013 1453   GFRAA >89 11/29/2011 1621    Lipid Panel  No results found for: CHOL, TRIG, HDL, CHOLHDL, VLDL, LDLCALC  CBC    Component Value Date/Time   WBC 10.5 12/12/2013 1030   RBC 3.99 12/12/2013 1030   HGB 13.5 12/12/2013 1030   HCT 37.4 12/12/2013 1030   PLT 239 12/12/2013 1030   MCV 93.7 12/12/2013 1030   MCH 33.8 12/12/2013 1030   MCHC 36.1* 12/12/2013 1030   RDW 12.8 12/12/2013 1030   LYMPHSABS 2.8 10/27/2013 1453   MONOABS 0.8 10/27/2013 1453   EOSABS 0.4 10/27/2013 1453   BASOSABS 0.0 10/27/2013 1453    Hgb A1C No results found for: HGBA1C       Assessment & Plan:   Abnormal Weight Gain:  Diet and exercise seems to be going well Cut out alcohol RX for Phentermine provided today  RTC in 1 month for weight check/med refill

## 2015-07-28 NOTE — Progress Notes (Signed)
Pre visit review using our clinic review tool, if applicable. No additional management support is needed unless otherwise documented below in the visit note. 

## 2015-07-28 NOTE — Patient Instructions (Signed)

## 2015-08-01 ENCOUNTER — Ambulatory Visit: Payer: Managed Care, Other (non HMO) | Admitting: Internal Medicine

## 2015-08-19 ENCOUNTER — Telehealth: Payer: Self-pay | Admitting: Internal Medicine

## 2015-08-19 NOTE — Telephone Encounter (Signed)
[facility]  TELEPHONE ADVICE RECORD   [facility]     [facility]    [facility]    Physician Nicki ReaperBaity, Regina - NP    Contact Type Call    Who Is Calling Patient / Member / Family / Caregiver    Call Type Triage / Clinical    Relationship To Patient Self  Gender: Female Return Phone Number [phone] (Primary)  DOB: 04/30/1980  Chief Complaint Skin Lesion - Moles/ Lumps/ Growths  Age: [age] Y 3116 D Reason for Call Symptomatic / Request for Health Information  Return Phone Number: [phone] (Primary) Initial Comment Caller had a blister on her knee the other day and drained it. Knee is healing but she now has a similar blister on her finger.   Address:  PreDisposition Call Doctor  City/State/Zip: [place]  Translation No    Nurse Assessment  Nurse: Logan BoresEvans, RN, Melissa Date/Time (Eastern Time): 08/19/2015 4:11:41 PM  Confirm and document reason for call. If symptomatic, describe symptoms. You must click the next button to save text entered. ---Caller had a blister on her knee the other day and drained it. Knee is healing but she now has a similar blister on her finger.  Has the patient traveled out of the country within the last 30 days? ---Not Applicable  Does the patient have any new or worsening symptoms? ---Yes  Will a triage be completed? ---Yes  Related visit to physician within the last 2 weeks? ---No  Does the PT have any chronic conditions? (i.e. diabetes, asthma, etc.) ---Yes  List chronic conditions. ---Hep. C,  Is the patient pregnant or possibly pregnant? (Ask all females between the ages of 4112-55) ---No  Is this a behavioral health or substance abuse call? ---No    Guidelines      Guideline Title Affirmed Question Affirmed Notes Nurse Date/Time (Eastern Time)  Blister - Foot and Hand [1] Cause unknown AND [2] new  blisters are developing  Evans, RN, Melissa 08/19/2015 4:14:32 PM  Disp. Time Lamount Cohen(Eastern Time) Disposition Final User         08/19/2015 4:18:28 PM See Physician within 24 Hours Yes Logan BoresEvans, RN, Efraim KaufmannMelissa         Caller Understands: Yes   Disagree/Comply: Comply      Care Advice Given Per Guideline         SEE PHYSICIAN WITHIN 24 HOURS: CALL BACK IF: * You become worse. * Fever occurs             Referrals   [facility]            Comments  User: Ardeen GarlandMelissa, Evans, RN Date/Time Lamount Cohen(Eastern Time): 08/19/2015 4:21:17 PM  Caller reports had a knee "blister" that drained and had pus-like drainage from it, developed new blister on finger that is pea-size that is similar looking.   Referrals  [facility]

## 2015-08-23 ENCOUNTER — Encounter: Payer: Self-pay | Admitting: Internal Medicine

## 2015-08-23 ENCOUNTER — Other Ambulatory Visit: Payer: Self-pay | Admitting: Internal Medicine

## 2015-08-23 ENCOUNTER — Ambulatory Visit (INDEPENDENT_AMBULATORY_CARE_PROVIDER_SITE_OTHER): Payer: Managed Care, Other (non HMO) | Admitting: Internal Medicine

## 2015-08-23 ENCOUNTER — Ambulatory Visit: Payer: Managed Care, Other (non HMO) | Admitting: Internal Medicine

## 2015-08-23 VITALS — BP 126/82 | HR 98 | Temp 98.1°F | Wt 211.5 lb

## 2015-08-23 DIAGNOSIS — R635 Abnormal weight gain: Secondary | ICD-10-CM

## 2015-08-23 DIAGNOSIS — Z113 Encounter for screening for infections with a predominantly sexual mode of transmission: Secondary | ICD-10-CM

## 2015-08-23 DIAGNOSIS — R21 Rash and other nonspecific skin eruption: Secondary | ICD-10-CM

## 2015-08-23 DIAGNOSIS — M79672 Pain in left foot: Secondary | ICD-10-CM | POA: Diagnosis not present

## 2015-08-23 MED ORDER — PHENTERMINE HCL 37.5 MG PO CAPS: 37.5000 mg | ORAL_CAPSULE | ORAL | Status: DC

## 2015-08-23 NOTE — Patient Instructions (Signed)

## 2015-08-23 NOTE — Progress Notes (Addendum)
Subjective:    Patient ID: Krystal Zamora, female    DOB: 01/29/1981, 35 y.o.   MRN: [id number]  HPI  Pt presents to the clinic today for 1 month follow up of abnormal weight gain. She was started on Phentermine 05/2015. Her starting weight was 234 lbs with a BMI of 37.21. She has been taking the Phentermine as directed. She denies adverse effects. Her weight today is 211 lbs with a BMI of 33.63. She has cut down alcohol.  Breakfast: eggs and a smoothie Lunch: sandwich Dinner: lean protein, carb and veggies Snacks: fruit, drinking 6-8 glasses of water per day  Exercise: She has been doing low impact videos 5-6 days a week.  She also c/o a rash. It is located on the inside of her right knee. She first noticed this 2 weeks ago, but within the last few days, it has spread to her hands, thighs and groin. The rash does itch. She has tried antibacterial ointment without any relief. She denies changes in soaps, lotions and detergents.  She also wants to be screened for STD's. She denies vaginal discharge.  She also c/o left heel pain. This has been going on for months. She describes the pain as sharp and stabbing. It is worth with walking and driving. She denies numbness and tingling. She thinks it is plantar fasciitis. She has been taking Aleve as needed. She has also been rolling her foot on a bottle of iced water without relief.   Review of Systems  Past Medical History  Diagnosis Date  . Hepatitis C virus 2004    s/p one month interferon treatment   . Hx of abnormal Pap smear 1997    s/p LEEP procedure.  . Hepatitis C   . Aspiration of vomit 2004  . Drug addiction in remission (HCC)     detox. of herion  . Chlamydia 12/11/13    During pregnancy, diagnosed at 6427w4d, PPROM on 7251w5d  . Obesity     Current Outpatient Prescriptions  Medication Sig Dispense Refill  . phentermine 37.5 MG capsule Take 1 capsule (37.5 mg total) by mouth every morning. Reported on 05/10/2015 30 capsule 0  .  triamcinolone cream (KENALOG) 0.1 % Apply 1 application topically 2 (two) times daily. 30 g 0   Current Facility-Administered Medications  Medication Dose Route Frequency Provider Last Rate Last Dose  . PARAGARD INTRAUTERINE COPPER IUD 1 Units  1 Units Intrauterine Once Tereso NewcomerUgonna A Anyanwu, MD        Allergies  Allergen Reactions  . Sulfa Antibiotics Hives    Family History  Problem Relation Age of Onset  . Adopted: Yes  . Other Neg Hx     Social History   Social History  . Marital Status: Married    Spouse Name: N/A  . Number of Children: N/A  . Years of Education: N/A   Occupational History  . Not on file.   Social History Main Topics  . Smoking status: Current Every Day Smoker -- 0.10 packs/day for 1 years    Types: Cigarettes    Last Attempt to Quit: 12/12/2005  . Smokeless tobacco: Never Used     Comment: 2 cigarettes a day  . Alcohol Use: 0.0 oz/week    0 Standard drinks or equivalent per week     Comment: no heroin use since 2004 after detox. occasional alcohol use  . Drug Use: No  . Sexual Activity:    Partners: Male    Pharmacist, hospitalBirth Control/ Protection: IUD  Other Topics Concern  . Not on file   Social History Narrative     Constitutional: Denies fever, malaise, fatigue, headache or abrupt weight changes.  Skin: Pt reports rash. Denies ulceration. Respiratory: Denies difficulty breathing, shortness of breath, cough or sputum production.   Cardiovascular: Denies chest pain, chest tightness, palpitations or swelling in the hands or feet.  Genitourinary: Denies urgency, frequency, dysuria, or vaginal discharge or odor. MSK: Pt reports left heel pain. Denies decrease in range of motion, joint pain or swelling. Neurological: Denies dizziness, difficulty with memory, difficulty with speech or problems with balance and coordination.    No other specific complaints in a complete review of systems (except as listed in HPI above).     Objective:   Physical  Exam  BP 126/82 mmHg  Pulse 98  Temp(Src) 98.1 F (36.7 C) (Oral)  Wt 211 lb 8 oz (95.936 kg)  SpO2 98%  LMP 08/06/2015   Wt Readings from Last 3 Encounters:  08/23/15 211 lb 8 oz (95.936 kg)  07/28/15 217 lb (98.431 kg)  06/30/15 220 lb (99.791 kg)    General: Appears her stated age, obese in NAD. Skin: Scattered, maculopapular rash, centered in bilateral groins. Excoriation noted from scratching. Cardiovascular: Normal rate and rhythm. S1,S2 noted.  No murmur, rubs or gallops noted.  Pulmonary/Chest: Normal effort and positive vesicular breath sounds. No respiratory distress. No wheezes, rales or ronchi noted. Abdomen: Soft, nontender.  Pelvic: Normal female anatomy. Small amount of tan, bloody discharge noted in the vaginal vault. No CMT. MSK: No pain with palpation of the achilles, heel or arch. No difficulty with gait. Neurological: Alert and oriented.    BMET    Component Value Date/Time   NA 135* 10/27/2013 1453   K 3.6* 10/27/2013 1453   CL 101 10/27/2013 1453   CO2 22 10/27/2013 1453   GLUCOSE 94 10/27/2013 1453   BUN 9 10/27/2013 1453   CREATININE 0.40* 10/27/2013 1453   CREATININE 0.59 11/29/2011 1621   CALCIUM 9.3 10/27/2013 1453   GFRNONAA >90 10/27/2013 1453   GFRNONAA >89 11/29/2011 1621   GFRAA >90 10/27/2013 1453   GFRAA >89 11/29/2011 1621    Lipid Panel  No results found for: CHOL, TRIG, HDL, CHOLHDL, VLDL, LDLCALC  CBC    Component Value Date/Time   WBC 10.5 12/12/2013 1030   RBC 3.99 12/12/2013 1030   HGB 13.5 12/12/2013 1030   HCT 37.4 12/12/2013 1030   PLT 239 12/12/2013 1030   MCV 93.7 12/12/2013 1030   MCH 33.8 12/12/2013 1030   MCHC 36.1* 12/12/2013 1030   RDW 12.8 12/12/2013 1030   LYMPHSABS 2.8 10/27/2013 1453   MONOABS 0.8 10/27/2013 1453   EOSABS 0.4 10/27/2013 1453   BASOSABS 0.0 10/27/2013 1453    Hgb A1C No results found for: HGBA1C       Assessment & Plan:   Abnormal Weight Gain:  Diet and exercise seems to  be going well Cut out alcohol RX for Phentermine provided today  Left Heel Pain:  Concern for plantar fasciitis Aleve once daily Make an appt with Dr. Patsy Lager to discuss further treatment options  Screen for STD's:  Wet prep to check for BV, trich and yeast Gen probe today  Rash:  Concern for folliculitis Continue antibacterial ointment OTC  RTC in 1 month for weight check/med refill  BAITY, REGINA

## 2015-08-23 NOTE — Addendum Note (Signed)
Addended by: Roena MaladyEVONTENNO, Charlean Carneal Y on: 08/23/2015 04:23 PM   Modules accepted: Orders

## 2015-08-23 NOTE — Progress Notes (Signed)
Pre visit review using our clinic review tool, if applicable. No additional management support is needed unless otherwise documented below in the visit note. 

## 2015-08-24 LAB — GC/CHLAMYDIA PROBE AMP
CT Probe RNA: NOT DETECTED
GC Probe RNA: NOT DETECTED

## 2015-08-24 LAB — WET PREP BY MOLECULAR PROBE
Candida species: NEGATIVE
Gardnerella vaginalis: NEGATIVE
Trichomonas vaginosis: NEGATIVE

## 2015-08-25 ENCOUNTER — Ambulatory Visit: Payer: Managed Care, Other (non HMO) | Admitting: Internal Medicine

## 2015-08-30 ENCOUNTER — Ambulatory Visit: Payer: Managed Care, Other (non HMO) | Admitting: Internal Medicine

## 2015-09-05 ENCOUNTER — Ambulatory Visit: Payer: Managed Care, Other (non HMO) | Admitting: Family Medicine

## 2015-09-07 ENCOUNTER — Ambulatory Visit: Payer: Managed Care, Other (non HMO) | Admitting: Family Medicine

## 2015-09-19 ENCOUNTER — Ambulatory Visit: Payer: Managed Care, Other (non HMO) | Admitting: Family Medicine

## 2015-09-19 ENCOUNTER — Ambulatory Visit: Payer: Managed Care, Other (non HMO) | Admitting: Internal Medicine

## 2015-09-19 DIAGNOSIS — Z0289 Encounter for other administrative examinations: Secondary | ICD-10-CM

## 2015-09-22 ENCOUNTER — Ambulatory Visit: Payer: Managed Care, Other (non HMO) | Admitting: Internal Medicine

## 2015-09-22 ENCOUNTER — Ambulatory Visit: Payer: Managed Care, Other (non HMO) | Admitting: Family Medicine

## 2015-12-08 ENCOUNTER — Ambulatory Visit (INDEPENDENT_AMBULATORY_CARE_PROVIDER_SITE_OTHER): Payer: Managed Care, Other (non HMO) | Admitting: Internal Medicine

## 2015-12-08 ENCOUNTER — Encounter: Payer: Self-pay | Admitting: Internal Medicine

## 2015-12-08 DIAGNOSIS — R635 Abnormal weight gain: Secondary | ICD-10-CM | POA: Diagnosis not present

## 2015-12-08 MED ORDER — PHENTERMINE HCL 37.5 MG PO CAPS: 37.5000 mg | ORAL_CAPSULE | ORAL | 1 refills | Status: DC

## 2015-12-08 NOTE — Patient Instructions (Signed)

## 2015-12-08 NOTE — Progress Notes (Signed)
Subjective:    Patient ID: Krystal Zamora, female    DOB: [birth year], 35 y.o.   MRN: [id number]  HPI  Pt presents to the clinic today to be restarted on Phentermine. She last took this 08/2015, and has been off for 3 months secondary to financial reasons.  Her weight today is 216 lbs with a BMI of 34.46. She only gained 5 lb in the last 3 months, since she stopped taking the Phentermine.   Breakfast: eggs and a smoothie Lunch: sandwich Dinner: frozen pizza Snacks: fruit, drinking 2 glasses of water per day  Exercise: She has been doing low impact videos for 30 minutes 5days a week.  Review of Systems  Past Medical History:  Diagnosis Date  . Aspiration of vomit 2004  . Chlamydia 12/11/13   During pregnancy, diagnosed at 110w4d, PPROM on [redacted]w[redacted]d  . 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 of herion  . Hepatitis C   . Hepatitis C virus 2004   s/p one month interferon treatment   . Hx of abnormal Pap smear 1997   s/p LEEP procedure.  . Obesity     Current Outpatient Prescriptions  Medication Sig Dispense Refill  . triamcinolone cream (KENALOG) 0.1 % Apply 1 application topically 2 (two) times daily. 30 g 0  . phentermine 37.5 MG capsule Take 1 capsule (37.5 mg total) by mouth every morning. Reported on 05/10/2015 30 capsule 1   Current Facility-Administered Medications  Medication Dose Route Frequency Provider Last Rate Last Dose  . PARAGARD INTRAUTERINE COPPER IUD 1 Units  1 Units Intrauterine Once Tereso Newcomer, MD        Allergies  Allergen Reactions  . Sulfa Antibiotics Hives    Family History  Problem Relation Age of Onset  . Adopted: Yes  . Other Neg Hx     Social History   Social History  . Marital status: Married    Spouse name: N/A  . Number of children: N/A  . Years of education: N/A   Occupational History  . Not on file.   Social History Main Topics  . Smoking status: Current Some Day Smoker    Packs/day: 0.10    Years: 1.00    Types:  Cigarettes    Last attempt to quit: 12/12/2005  . Smokeless tobacco: Never Used     Comment: 2 cigarettes per week  . Alcohol use 0.0 oz/week     Comment: no heroin use since 2004 after detox. occasional alcohol use  . Drug use: No  . Sexual activity: Yes    Partners: Male    Birth control/ protection: IUD   Other Topics Concern  . Not on file   Social History Narrative  . No narrative on file     Constitutional: Denies fever, malaise, fatigue, headache or abrupt weight changes.  Respiratory: Denies difficulty breathing, shortness of breath, cough or sputum production.   Cardiovascular: Denies chest pain, chest tightness, palpitations or swelling in the hands or feet.  Neurological: Denies dizziness, difficulty with memory, difficulty with speech or problems with balance and coordination.    No other specific complaints in a complete review of systems (except as listed in HPI above).     Objective:   Physical Exam  BP 122/84   Pulse 75   Temp 97.9 F (36.6 C) (Oral)   Wt 216 lb 12 oz (98.3 kg)   LMP 11/16/2015   SpO2 98%   BMI 34.46 kg/m    Wt  Readings from Last 3 Encounters:  12/08/15 216 lb 12 oz (98.3 kg)  08/23/15 211 lb 8 oz (95.9 kg)  07/28/15 217 lb (98.4 kg)    General: Appears her stated age, obese in NAD. Cardiovascular: Normal rate and rhythm. S1,S2 noted.  No murmur, rubs or gallops noted.  Pulmonary/Chest: Normal effort and positive vesicular breath sounds. No respiratory distress. No wheezes, rales or ronchi noted. Neurological: Alert and oriented.    BMET    Component Value Date/Time   NA 135 (L) 10/27/2013 1453   K 3.6 (L) 10/27/2013 1453   CL 101 10/27/2013 1453   CO2 22 10/27/2013 1453   GLUCOSE 94 10/27/2013 1453   BUN 9 10/27/2013 1453   CREATININE 0.40 (L) 10/27/2013 1453   CREATININE 0.59 11/29/2011 1621   CALCIUM 9.3 10/27/2013 1453   GFRNONAA >90 10/27/2013 1453   GFRNONAA >89 11/29/2011 1621   GFRAA >90 10/27/2013 1453    GFRAA >89 11/29/2011 1621    Lipid Panel  No results found for: CHOL, TRIG, HDL, CHOLHDL, VLDL, LDLCALC  CBC    Component Value Date/Time   WBC 10.5 12/12/2013 1030   RBC 3.99 12/12/2013 1030   HGB 13.5 12/12/2013 1030   HCT 37.4 12/12/2013 1030   PLT 239 12/12/2013 1030   MCV 93.7 12/12/2013 1030   MCH 33.8 12/12/2013 1030   MCHC 36.1 (H) 12/12/2013 1030   RDW 12.8 12/12/2013 1030   LYMPHSABS 2.8 10/27/2013 1453   MONOABS 0.8 10/27/2013 1453   EOSABS 0.4 10/27/2013 1453   BASOSABS 0.0 10/27/2013 1453    Hgb A1C No results found for: HGBA1C       Assessment & Plan:   Abnormal Weight Gain:  Encouraged her to consume a high protein, low carb diet and continue exercise RX for Phentermine provided today   RTC in 2 months for weight check/med refill  Lanard Arguijo

## 2016-02-01 ENCOUNTER — Telehealth: Payer: Self-pay | Admitting: Internal Medicine

## 2016-02-01 ENCOUNTER — Ambulatory Visit: Payer: Managed Care, Other (non HMO) | Admitting: Internal Medicine

## 2016-02-01 DIAGNOSIS — Z0289 Encounter for other administrative examinations: Secondary | ICD-10-CM

## 2016-02-01 NOTE — Telephone Encounter (Signed)
No follow up needed

## 2016-02-01 NOTE — Progress Notes (Deleted)
Subjective:    Patient ID: Krystal Zamora, female    DOB: 09/15/1980, 35 y.o.   MRN: [id number]  HPI  Pt presents to the clinic today for 2 month follow up for weight check/med refill. She was restarted on Phentermine 12/2015. Her starting weight was 216.75 with a BMI of 34.46.  Breakfast: eggs and a smoothie Lunch: sandwich Dinner: frozen pizza Snacks: fruit, drinking 2 glasses of water per day  Exercise: She has been doing low impact videos for 30 minutes 5 days a week.  Review of Systems      Past Medical History:  Diagnosis Date  . Aspiration of vomit 2004  . Chlamydia 12/11/13   During pregnancy, diagnosed at 4673w4d, PPROM on 5534w5d  . 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 of herion  . Hepatitis C   . Hepatitis C virus 2004   s/p one month interferon treatment   . Hx of abnormal Pap smear 1997   s/p LEEP procedure.  . Obesity     Current Outpatient Prescriptions  Medication Sig Dispense Refill  . phentermine 37.5 MG capsule Take 1 capsule (37.5 mg total) by mouth every morning. Reported on 05/10/2015 30 capsule 1  . triamcinolone cream (KENALOG) 0.1 % Apply 1 application topically 2 (two) times daily. 30 g 0   Current Facility-Administered Medications  Medication Dose Route Frequency Provider Last Rate Last Dose  . PARAGARD INTRAUTERINE COPPER IUD 1 Units  1 Units Intrauterine Once Tereso NewcomerUgonna A Anyanwu, MD        Allergies  Allergen Reactions  . Sulfa Antibiotics Hives    Family History  Problem Relation Age of Onset  . Adopted: Yes  . Other Neg Hx     Social History   Social History  . Marital status: Married    Spouse name: N/A  . Number of children: N/A  . Years of education: N/A   Occupational History  . Not on file.   Social History Main Topics  . Smoking status: Current Some Day Smoker    Packs/day: 0.10    Years: 1.00    Types: Cigarettes    Last attempt to quit: 12/12/2005  . Smokeless tobacco: Never Used     Comment: 2 cigarettes per  week  . Alcohol use 0.0 oz/week     Comment: no heroin use since 2004 after detox. occasional alcohol use  . Drug use: No  . Sexual activity: Yes    Partners: Male    Birth control/ protection: IUD   Other Topics Concern  . Not on file   Social History Narrative  . No narrative on file     Constitutional: Denies fever, malaise, fatigue, headache or abrupt weight changes.  HEENT: Denies eye pain, eye redness, ear pain, ringing in the ears, wax buildup, runny nose, nasal congestion, bloody nose, or sore throat. Respiratory: Denies difficulty breathing, shortness of breath, cough or sputum production.   Cardiovascular: Denies chest pain, chest tightness, palpitations or swelling in the hands or feet.  Gastrointestinal: Denies abdominal pain, bloating, constipation, diarrhea or blood in the stool.  GU: Denies urgency, frequency, pain with urination, burning sensation, blood in urine, odor or discharge. Musculoskeletal: Denies decrease in range of motion, difficulty with gait, muscle pain or joint pain and swelling.  Skin: Denies redness, rashes, lesions or ulcercations.  Neurological: Denies dizziness, difficulty with memory, difficulty with speech or problems with balance and coordination.  Psych: Denies anxiety, depression, SI/HI.  No other specific complaints in  a complete review of systems (except as listed in HPI above).   Objective:   Physical Exam        Assessment & Plan:

## 2016-02-01 NOTE — Telephone Encounter (Signed)
Patient did not come in for their appointment today for 2 mo follow up.  Please let me know if patient needs to be contacted immediately for follow up or no follow up needed. °

## 2016-02-27 ENCOUNTER — Telehealth: Payer: Self-pay

## 2016-02-27 NOTE — Telephone Encounter (Signed)
Evekeo is for ADHD, not weight loss. No I would not prescribe this.

## 2016-02-27 NOTE — Telephone Encounter (Signed)
Pt left v/m wanting to know before scheduling an appt if R Baity NP would prescribe med that sounds like Evekeo instead of phentermine. Pt request cb. Pt last seen 12/08/15.

## 2016-02-28 NOTE — Telephone Encounter (Signed)
Pt is aware as instructed---pt wants to know if there is anything else she can try or do you have any suggestions?--please advise

## 2016-02-28 NOTE — Telephone Encounter (Signed)
I don't do much outside of Phentermine. She can look up one of the bariatric wellness programs, they usually offer B12 with hcg injections that may work for her

## 2016-02-29 NOTE — Telephone Encounter (Signed)
Pt is aware as instructed 

## 2016-03-29 ENCOUNTER — Encounter: Payer: Self-pay | Admitting: Internal Medicine

## 2016-03-29 ENCOUNTER — Ambulatory Visit (INDEPENDENT_AMBULATORY_CARE_PROVIDER_SITE_OTHER): Payer: Managed Care, Other (non HMO) | Admitting: Internal Medicine

## 2016-03-29 DIAGNOSIS — R635 Abnormal weight gain: Secondary | ICD-10-CM | POA: Diagnosis not present

## 2016-03-29 MED ORDER — PHENTERMINE HCL 37.5 MG PO CAPS: 37.5000 mg | ORAL_CAPSULE | ORAL | 0 refills | Status: DC

## 2016-03-29 NOTE — Progress Notes (Signed)
Subjective:    Patient ID: Krystal Zamora, female    DOB: 11/01/1980, 35 y.o.   MRN: [id number]  HPI  Pt presents to the clinic today to discuss getting back on Phentermine. She has been on it a few times over the last year, but never followed up. The last time she had a RX for it was 12/2015. Her weight today is 229 lbs with a BMI of 36.41.  Breakfast: She does not eat breakfast. Lunch: She typically eats a sandwich. Dinner: [employer] CorporationPork chops and mashed potatoes or pasta, crockpot meals. Snacks: chocolate, chex mix  Exercise: None  Review of Systems      Past Medical History:  Diagnosis Date  . Aspiration of vomit 2004  . Chlamydia 12/11/13   During pregnancy, diagnosed at 1352w4d, PPROM on 7624w5d  . 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 of herion  . Hepatitis C   . Hepatitis C virus 2004   s/p one month interferon treatment   . Hx of abnormal Pap smear 1997   s/p LEEP procedure.  . Obesity     Current Outpatient Prescriptions  Medication Sig Dispense Refill  . phentermine 37.5 MG capsule Take 1 capsule (37.5 mg total) by mouth every morning. Reported on 05/10/2015 30 capsule 1   Current Facility-Administered Medications  Medication Dose Route Frequency Provider Last Rate Last Dose  . PARAGARD INTRAUTERINE COPPER IUD 1 Units  1 Units Intrauterine Once Tereso NewcomerUgonna A Anyanwu, MD        Allergies  Allergen Reactions  . Sulfa Antibiotics Hives    Family History  Problem Relation Age of Onset  . Adopted: Yes  . Other Neg Hx     Social History   Social History  . Marital status: Married    Spouse name: N/A  . Number of children: N/A  . Years of education: N/A   Occupational History  . Not on file.   Social History Main Topics  . Smoking status: Current Some Day Smoker    Packs/day: 0.10    Years: 1.00    Types: Cigarettes    Last attempt to quit: 12/12/2005  . Smokeless tobacco: Never Used     Comment: 2 cigarettes per week  . Alcohol use 0.0 oz/week   Comment: no heroin use since 2004 after detox. occasional alcohol use  . Drug use: No  . Sexual activity: Yes    Partners: Male    Birth control/ protection: IUD   Other Topics Concern  . Not on file   Social History Narrative  . No narrative on file     Constitutional: Pt reports weight gain. Denies fever, malaise, fatigue, headache.  Respiratory: Denies difficulty breathing, shortness of breath, cough or sputum production.   Cardiovascular: Denies chest pain, chest tightness, palpitations or swelling in the hands or feet.    No other specific complaints in a complete review of systems (except as listed in HPI above).  Objective:   Physical Exam   BP 126/84   Pulse 100   Temp 97.8 F (36.6 C) (Oral)   Wt 229 lb (103.9 kg)   LMP 03/06/2016   SpO2 98%   BMI 36.41 kg/m  Wt Readings from Last 3 Encounters:  03/29/16 229 lb (103.9 kg)  12/08/15 216 lb 12 oz (98.3 kg)  08/23/15 211 lb 8 oz (95.9 kg)    General: Appears her stated age, obese in NAD. Cardiovascular: Normal rate and rhythm. S1,S2 noted.   Pulmonary/Chest: Normal effort  and positive vesicular breath sounds. No respiratory distress. No wheezes, rales or ronchi noted.   BMET    Component Value Date/Time   NA 135 (L) 10/27/2013 1453   K 3.6 (L) 10/27/2013 1453   CL 101 10/27/2013 1453   CO2 22 10/27/2013 1453   GLUCOSE 94 10/27/2013 1453   BUN 9 10/27/2013 1453   CREATININE 0.40 (L) 10/27/2013 1453   CREATININE 0.59 11/29/2011 1621   CALCIUM 9.3 10/27/2013 1453   GFRNONAA >90 10/27/2013 1453   GFRNONAA >89 11/29/2011 1621   GFRAA >90 10/27/2013 1453   GFRAA >89 11/29/2011 1621    Lipid Panel  No results found for: CHOL, TRIG, HDL, CHOLHDL, VLDL, LDLCALC  CBC    Component Value Date/Time   WBC 10.5 12/12/2013 1030   RBC 3.99 12/12/2013 1030   HGB 13.5 12/12/2013 1030   HCT 37.4 12/12/2013 1030   PLT 239 12/12/2013 1030   MCV 93.7 12/12/2013 1030   MCH 33.8 12/12/2013 1030   MCHC 36.1 (H)  12/12/2013 1030   RDW 12.8 12/12/2013 1030   LYMPHSABS 2.8 10/27/2013 1453   MONOABS 0.8 10/27/2013 1453   EOSABS 0.4 10/27/2013 1453   BASOSABS 0.0 10/27/2013 1453    Hgb A1C No results found for: HGBA1C       Assessment & Plan:

## 2016-03-29 NOTE — Patient Instructions (Signed)
Carbohydrate Counting for Diabetes Mellitus, Adult Carbohydrate counting is a method for keeping track of how many carbohydrates you eat. Eating carbohydrates naturally increases the amount of sugar (glucose) in the blood. Counting how many carbohydrates you eat helps keep your blood glucose within normal limits, which helps you manage your diabetes (diabetes mellitus). It is important to know how many carbohydrates you can safely have in each meal. This is different for every person. A diet and nutrition specialist (registered dietitian) can help you make a meal plan and calculate how many carbohydrates you should have at each meal and snack. Carbohydrates are found in the following foods:  Grains, such as breads and cereals.  Dried beans and soy products.  Starchy vegetables, such as potatoes, peas, and corn.  Fruit and fruit juices.  Milk and yogurt.  Sweets and snack foods, such as cake, cookies, candy, chips, and soft drinks. How do I count carbohydrates? There are two ways to count carbohydrates in food. You can use either of the methods or a combination of both. Reading "Nutrition Facts" on packaged food  The "Nutrition Facts" list is included on the labels of almost all packaged foods and beverages in the [place] It includes:  The serving size.  Information about nutrients in each serving, including the grams (g) of carbohydrate per serving. To use the "Nutrition Facts":  Decide how many servings you will have.  Multiply the number of servings by the number of carbohydrates per serving.  The resulting number is the total amount of carbohydrates that you will be having. Learning standard serving sizes of other foods  When you eat foods containing carbohydrates that are not packaged or do not include "Nutrition Facts" on the label, you need to measure the servings in order to count the amount of carbohydrates:  Measure the foods that you will eat with a food scale or measuring  cup, if needed.  Decide how many standard-size servings you will eat.  Multiply the number of servings by 15. Most carbohydrate-rich foods have about 15 g of carbohydrates per serving.  For example, if you eat 8 oz (170 g) of strawberries, you will have eaten 2 servings and 30 g of carbohydrates (2 servings x 15 g = 30 g).  For foods that have more than one food mixed, such as soups and casseroles, you must count the carbohydrates in each food that is included. The following list contains standard serving sizes of common carbohydrate-rich foods. Each of these servings has about 15 g of carbohydrates:   hamburger bun or  English muffin.   oz (15 mL) syrup.   oz (14 g) jelly.  1 slice of bread.  1 six-inch tortilla.  3 oz (85 g) cooked rice or pasta.  4 oz (113 g) cooked dried beans.  4 oz (113 g) starchy vegetable, such as peas, corn, or potatoes.  4 oz (113 g) hot cereal.  4 oz (113 g) mashed potatoes or  of a large baked potato.  4 oz (113 g) canned or frozen fruit.  4 oz (120 mL) fruit juice.  4-6 crackers.  6 chicken nuggets.  6 oz (170 g) unsweetened dry cereal.  6 oz (170 g) plain fat-free yogurt or yogurt sweetened with artificial sweeteners.  8 oz (240 mL) milk.  8 oz (170 g) fresh fruit or one small piece of fruit.  24 oz (680 g) popped popcorn. Example of carbohydrate counting Sample meal  3 oz (85 g) chicken breast.  6 oz (  170 g) brown rice.  4 oz (113 g) corn.  8 oz (240 mL) milk.  8 oz (170 g) strawberries with sugar-free whipped topping. Carbohydrate calculation 1. Identify the foods that contain carbohydrates:  Rice.  Corn.  Milk.  Strawberries. 2. Calculate how many servings you have of each food:  2 servings rice.  1 serving corn.  1 serving milk.  1 serving strawberries. 3. Multiply each number of servings by 15 g:  2 servings rice x 15 g = 30 g.  1 serving corn x 15 g = 15 g.  1 serving milk x 15 g = 15  g.  1 serving strawberries x 15 g = 15 g. 4. Add together all of the amounts to find the total grams of carbohydrates eaten:  30 g + 15 g + 15 g + 15 g = 75 g of carbohydrates total. This information is not intended to replace advice given to you by your health care provider. Make sure you discuss any questions you have with your health care provider. Document Released: 03/19/2005 Document Revised: 10/07/2015 Document Reviewed: 08/31/2015 Elsevier Interactive Patient Education  2017 [employer]  

## 2016-03-29 NOTE — Assessment & Plan Note (Signed)
Discussed the importance of a high protein, low carb diet She will work on diet over the next month and then plan to add in exercise RX for Phentermine 37.5 mg today  RTC in 1 month for weight check/med refill

## 2016-05-01 ENCOUNTER — Ambulatory Visit: Payer: Managed Care, Other (non HMO) | Admitting: Internal Medicine

## 2016-10-11 DIAGNOSIS — E669 Obesity, unspecified: Secondary | ICD-10-CM | POA: Diagnosis not present

## 2016-11-09 DIAGNOSIS — F419 Anxiety disorder, unspecified: Secondary | ICD-10-CM | POA: Diagnosis not present

## 2016-11-09 DIAGNOSIS — F338 Other recurrent depressive disorders: Secondary | ICD-10-CM | POA: Diagnosis not present

## 2016-11-09 DIAGNOSIS — F1920 Other psychoactive substance dependence, uncomplicated: Secondary | ICD-10-CM | POA: Diagnosis not present

## 2016-11-09 DIAGNOSIS — Z79899 Other long term (current) drug therapy: Secondary | ICD-10-CM | POA: Diagnosis not present

## 2016-11-09 DIAGNOSIS — F902 Attention-deficit hyperactivity disorder, combined type: Secondary | ICD-10-CM | POA: Diagnosis not present

## 2016-11-29 ENCOUNTER — Encounter: Payer: Self-pay | Admitting: Internal Medicine

## 2016-11-29 ENCOUNTER — Ambulatory Visit (INDEPENDENT_AMBULATORY_CARE_PROVIDER_SITE_OTHER): Payer: BLUE CROSS/BLUE SHIELD | Admitting: Internal Medicine

## 2016-11-29 DIAGNOSIS — R635 Abnormal weight gain: Secondary | ICD-10-CM | POA: Diagnosis not present

## 2016-11-29 MED ORDER — PHENTERMINE HCL 37.5 MG PO CAPS: 37.5000 mg | ORAL_CAPSULE | ORAL | 0 refills | Status: DC

## 2016-11-29 NOTE — Progress Notes (Signed)
Subjective:    Patient ID: Krystal Zamora, female    DOB: May 13, 1980, 36 y.o.   MRN: [id number]  HPI  Pt presents to the clinic today to discuss weight loss. She reports she has noticed weight gain since starting her new job. She is more sedentary and has been eating out more. She was last on Phentermine 03/2016. Her weight today is 235 lbs with a BMI of 37.36.  Review of Systems      Past Medical History:  Diagnosis Date  . Aspiration of vomit 2004  . Chlamydia 12/11/13   During pregnancy, diagnosed at [redacted]w[redacted]d, PPROM on [redacted]w[redacted]d  . 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 of herion  . Hepatitis C   . Hepatitis C virus 2004   s/p one month interferon treatment   . Hx of abnormal Pap smear 1997   s/p LEEP procedure.  . Obesity     Current Outpatient Prescriptions  Medication Sig Dispense Refill  . phentermine 37.5 MG capsule Take 1 capsule (37.5 mg total) by mouth every morning. Reported on 05/10/2015 30 capsule 0   Current Facility-Administered Medications  Medication Dose Route Frequency Provider Last Rate Last Dose  . PARAGARD INTRAUTERINE COPPER IUD 1 Units  1 Units Intrauterine Once Anyanwu, Ugonna A, MD        Allergies  Allergen Reactions  . Sulfa Antibiotics Hives    Family History  Problem Relation Age of Onset  . Adopted: Yes  . Other Neg Hx     Social History   Social History  . Marital status: Married    Spouse name: N/A  . Number of children: N/A  . Years of education: N/A   Occupational History  . Not on file.   Social History Main Topics  . Smoking status: Current Some Day Smoker    Packs/day: 0.10    Years: 1.00    Types: Cigarettes    Last attempt to quit: 12/12/2005  . Smokeless tobacco: Never Used     Comment: 2 cigarettes per week  . Alcohol use 0.0 oz/week     Comment: no heroin use since 2004 after detox. occasional alcohol use  . Drug use: No  . Sexual activity: Yes    Partners: Male    Birth control/ protection: IUD   Other  Topics Concern  . Not on file   Social History Narrative  . No narrative on file     Constitutional: Pt reports weight gain. Denies fever, malaise, fatigue, headache.  Respiratory: Denies difficulty breathing, shortness of breath, cough or sputum production.   Cardiovascular: Denies chest pain, chest tightness, palpitations or swelling in the hands or feet.    No other specific complaints in a complete review of systems (except as listed in HPI above).   Objective:   Physical Exam  Pulse 97   Temp 98 F (36.7 C) (Oral)   Wt 235 lb (106.6 kg)   SpO2 97%   BMI 37.36 kg/m  Wt Readings from Last 3 Encounters:  11/29/16 235 lb (106.6 kg)  03/29/16 229 lb (103.9 kg)  12/08/15 216 lb 12 oz (98.3 kg)    General: Appears her stated age, obese, in NAD.  Cardiovascular: Normal rate and rhythm. Neurological: Alert and oriented.   BMET    Component Value Date/Time   NA 135 (L) 10/27/2013 1453   K 3.6 (L) 10/27/2013 1453   CL 101 10/27/2013 1453   CO2 22 10/27/2013 1453   GLUCOSE 94 10/27/2013  1453   BUN 9 10/27/2013 1453   CREATININE 0.40 (L) 10/27/2013 1453   CREATININE 0.59 11/29/2011 1621   CALCIUM 9.3 10/27/2013 1453   GFRNONAA >90 10/27/2013 1453   GFRNONAA >89 11/29/2011 1621   GFRAA >90 10/27/2013 1453   GFRAA >89 11/29/2011 1621    Lipid Panel  No results found for: CHOL, TRIG, HDL, CHOLHDL, VLDL, LDLCALC  CBC    Component Value Date/Time   WBC 10.5 12/12/2013 1030   RBC 3.99 12/12/2013 1030   HGB 13.5 12/12/2013 1030   HCT 37.4 12/12/2013 1030   PLT 239 12/12/2013 1030   MCV 93.7 12/12/2013 1030   MCH 33.8 12/12/2013 1030   MCHC 36.1 (H) 12/12/2013 1030   RDW 12.8 12/12/2013 1030   LYMPHSABS 2.8 10/27/2013 1453   MONOABS 0.8 10/27/2013 1453   EOSABS 0.4 10/27/2013 1453   BASOSABS 0.0 10/27/2013 1453    Hgb A1C No results found for: HGBA1C          Assessment & Plan:

## 2016-11-29 NOTE — Patient Instructions (Signed)

## 2016-11-29 NOTE — Assessment & Plan Note (Signed)
Discussed the importance of making diet changes, high protein, low carb and starting to exercise routinely Will prescribe Phentermine 37.5 mg daily but discussed the importance of followup  RTC in 1 month for weight check/med refill

## 2016-12-27 ENCOUNTER — Encounter: Payer: Self-pay | Admitting: Emergency Medicine

## 2016-12-27 ENCOUNTER — Emergency Department
Admission: EM | Admit: 2016-12-27 | Discharge: 2016-12-27 | Disposition: A | Discharge status: Home / Self Care | Payer: BLUE CROSS/BLUE SHIELD | Attending: Emergency Medicine | Admitting: Emergency Medicine

## 2016-12-27 DIAGNOSIS — H6692 Otitis media, unspecified, left ear: Secondary | ICD-10-CM | POA: Insufficient documentation

## 2016-12-27 DIAGNOSIS — Z79899 Other long term (current) drug therapy: Secondary | ICD-10-CM | POA: Insufficient documentation

## 2016-12-27 DIAGNOSIS — H6690 Otitis media, unspecified, unspecified ear: Secondary | ICD-10-CM

## 2016-12-27 DIAGNOSIS — F17210 Nicotine dependence, cigarettes, uncomplicated: Secondary | ICD-10-CM | POA: Diagnosis not present

## 2016-12-27 DIAGNOSIS — H9202 Otalgia, left ear: Secondary | ICD-10-CM

## 2016-12-27 MED ORDER — AMOXICILLIN 500 MG PO CAPS
500.0000 mg | ORAL_CAPSULE | Freq: Once | ORAL | Status: AC
  Administered 2016-12-27: 500 mg via ORAL
  Filled 2016-12-27: qty 1

## 2016-12-27 MED ORDER — IBUPROFEN 800 MG PO TABS: 800.0000 mg | ORAL_TABLET | Freq: Three times a day (TID) | ORAL | 0 refills | Status: DC | PRN

## 2016-12-27 MED ORDER — IBUPROFEN 800 MG PO TABS
800.0000 mg | ORAL_TABLET | Freq: Once | ORAL | Status: AC
  Administered 2016-12-27: 800 mg via ORAL
  Filled 2016-12-27: qty 1

## 2016-12-27 MED ORDER — HYDROCODONE-ACETAMINOPHEN 5-325 MG PO TABS: 1.0000 | ORAL_TABLET | Freq: Four times a day (QID) | ORAL | 0 refills | Status: DC | PRN

## 2016-12-27 MED ORDER — LIDOCAINE HCL (PF) 1 % IJ SOLN
2.0000 mL | Freq: Once | INTRAMUSCULAR | Status: AC
  Administered 2016-12-27: 2 mL
  Filled 2016-12-27: qty 5

## 2016-12-27 MED ORDER — AMOXICILLIN 500 MG PO CAPS: 500.0000 mg | ORAL_CAPSULE | Freq: Three times a day (TID) | ORAL | 0 refills | Status: DC

## 2016-12-27 NOTE — ED Triage Notes (Signed)
Pt c/o sudden onset of left ear pain after pt was working out in gym an hour ago. Pt sts pain was sudden and felt like a sharpe knife and since, pain has continued to worsen. Pt denies drainage.

## 2016-12-27 NOTE — Discharge Instructions (Signed)
1. Take antibiotic as prescribed (amoxicillin 500 mg 3 times daily 7 days). 2. Take pain medicines as needed (Motrin/Norco #15). 3. Turn to the ER for worsening symptoms, persistent vomiting, fever, drainage or other concerns.

## 2016-12-27 NOTE — ED Provider Notes (Signed)
[facility] Emergency Department Provider Note   ____________________________________________   First MD Initiated Contact with Patient 12/27/16 0129     (approximate)  I have reviewed the triage vital signs and the nursing notes.   HISTORY  Chief Complaint Otalgia    HPI Krystal Zamora is a 36 y.o. female who presents to the ED from home with a chief complaint of left ear pain. Patient reports she was in her usual state of good health when she experienced a sudden onset of left ear pain after working out in her gym approximately one hour prior to arrival. Describes sharp pain to her left ear associated with some muffling. Denies drainage. Denies recent barotrauma, air travel or swimming. Denies associated fever, chills, sinus pressure/congestion, chest pain, shortness of breath, abdominal pain, nausea, vomiting. Denies recent trauma. Nothing makes her symptoms better or worse.   Past Medical History:  Diagnosis Date  . Aspiration of vomit 2004  . Chlamydia 12/11/13   During pregnancy, diagnosed at [redacted]w[redacted]d, PPROM on [redacted]w[redacted]d  . 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 of herion  . Hepatitis C   . Hepatitis C virus 2004   s/p one month interferon treatment   . Hx of abnormal Pap smear 1997   s/p LEEP procedure.  . Obesity     Patient Active Problem List   Diagnosis Date Noted  . Abnormal weight gain 05/10/2015  . Hepatitis C 05/16/2011    Past Surgical History:  Procedure Laterality Date  . ASPIRATION BIOPSY     in ICU for aspiration of vomit for 7 weeks  . LIVER BIOPSY  2009  . WISDOM TOOTH EXTRACTION      Prior to Admission medications   Medication Sig Start Date End Date Taking? Authorizing Provider  amoxicillin (AMOXIL) 500 MG capsule Take 1 capsule (500 mg total) by mouth 3 (three) times daily. 12/27/16   Irean Hong, MD  HYDROcodone-acetaminophen (NORCO) 5-325 MG tablet Take 1 tablet by mouth every 6 (six) hours as needed for moderate  pain. 12/27/16   Irean Hong, MD  ibuprofen (ADVIL,MOTRIN) 800 MG tablet Take 1 tablet (800 mg total) by mouth every 8 (eight) hours as needed for moderate pain. 12/27/16   Irean Hong, MD  phentermine 37.5 MG capsule Take 1 capsule (37.5 mg total) by mouth every morning. Reported on 05/10/2015 11/29/16   Lorre Munroe, NP    Allergies Sulfa antibiotics  Family History  Problem Relation Age of Onset  . Adopted: Yes  . Other Neg Hx     Social History Social History  Substance Use Topics  . Smoking status: Current Some Day Smoker    Packs/day: 0.10    Years: 1.00    Types: Cigarettes    Last attempt to quit: 12/12/2005  . Smokeless tobacco: Never Used     Comment: 2 cigarettes per week  . Alcohol use 0.0 oz/week     Comment: no heroin use since 2004 after detox. occasional alcohol use    Review of Systems  Constitutional: No fever/chills Eyes: No visual changes. ENT: positive for left ear pain. No sore throat. Cardiovascular: Denies chest pain. Respiratory: Denies shortness of breath. Gastrointestinal: No abdominal pain.  No nausea, no vomiting.  No diarrhea.  No constipation. Genitourinary: Negative for dysuria. Musculoskeletal: Negative for back pain. Skin: Negative for rash. Neurological: Negative for headaches, focal weakness or numbness.   ____________________________________________   PHYSICAL EXAM:  VITAL SIGNS: ED Triage Vitals  Enc Vitals Group     BP 12/27/16 0038 (!) 161/101     Pulse Rate 12/27/16 0038 96     Resp 12/27/16 0038 17     Temp 12/27/16 0038 97.7 F (36.5 C)     Temp Source 12/27/16 0038 Oral     SpO2 12/27/16 0038 98 %     Weight 12/27/16 0032 235 lb (106.6 kg)     Height --      Head Circumference --      Peak Flow --      Pain Score --      Pain Loc --      Pain Edu? --      Excl. in GC? --     Constitutional: Alert and oriented. Well appearing and in no acute distress. Eyes: Conjunctivae are normal. PERRL. EOMI. Head:  Atraumatic. Ears: Right TM within normal limits. Left TM erythematous, bulging without perforation. Nose: No congestion/rhinnorhea. Mouth/Throat: Mucous membranes are moist.  Oropharynx non-erythematous. Neck: No stridor.   Hematological/Lymphatic/Immunilogical: Shotty left anterior cervical lymphadenopathy. Cardiovascular: Normal rate, regular rhythm. Grossly normal heart sounds.  Good peripheral circulation. Respiratory: Normal respiratory effort.  No retractions. Lungs CTAB. Gastrointestinal: Soft and nontender. No distention. No abdominal bruits. No CVA tenderness. Musculoskeletal: No lower extremity tenderness nor edema.  No joint effusions. Neurologic:  Normal speech and language. No gross focal neurologic deficits are appreciated. No gait instability. Skin:  Skin is warm, dry and intact. No rash noted. Psychiatric: Mood and affect are normal. Speech and behavior are normal.  ____________________________________________   LABS (all labs ordered are listed, but only abnormal results are displayed)  Labs Reviewed - No data to display ____________________________________________  EKG  none ____________________________________________  RADIOLOGY  No results found.  ____________________________________________   PROCEDURES  Procedure(s) performed: None  Procedures  Critical Care performed: No  ____________________________________________   INITIAL IMPRESSION / ASSESSMENT AND PLAN / ED COURSE  Pertinent labs & imaging results that were available during my care of the patient were reviewed by me and considered in my medical decision making (see chart for details).  36 year old female who presents with otalgia secondary to otitis media. Lidocaine drops administered for pain. Will start patient on amoxicillin. Advised her to recheck her blood pressure in 3 days with her doctor. Most likely elevated blood pressure secondary to pain. Strict return precautions given.  Patient verbalizes understanding and agrees with plan of care.      ____________________________________________   FINAL CLINICAL IMPRESSION(S) / ED DIAGNOSES  Final diagnoses:  Otalgia of left ear  Acute otitis media, unspecified otitis media type      NEW MEDICATIONS STARTED DURING THIS VISIT:  Discharge Medication List as of 12/27/2016  1:49 AM    START taking these medications   Details  amoxicillin (AMOXIL) 500 MG capsule Take 1 capsule (500 mg total) by mouth 3 (three) times daily., Starting Thu 12/27/2016, Print    HYDROcodone-acetaminophen (NORCO) 5-325 MG tablet Take 1 tablet by mouth every 6 (six) hours as needed for moderate pain., Starting Thu 12/27/2016, Print    ibuprofen (ADVIL,MOTRIN) 800 MG tablet Take 1 tablet (800 mg total) by mouth every 8 (eight) hours as needed for moderate pain., Starting Thu 12/27/2016, Print         Note:  This document was prepared using Dragon voice recognition software and may include unintentional dictation errors.    Irean Hong, MD 12/27/16 [phone]

## 2016-12-27 NOTE — ED Notes (Signed)
Pt states L ear pain and frontal HA. Began today. Denies cough, congestion, fevers, N&V, chills. Pt is alert, oriented, ambulatory. Denies dizziness as well.

## 2016-12-31 ENCOUNTER — Ambulatory Visit: Payer: BLUE CROSS/BLUE SHIELD | Admitting: Internal Medicine

## 2016-12-31 DIAGNOSIS — Z0289 Encounter for other administrative examinations: Secondary | ICD-10-CM

## 2017-01-03 ENCOUNTER — Ambulatory Visit: Payer: BLUE CROSS/BLUE SHIELD | Admitting: Internal Medicine

## 2017-01-03 DIAGNOSIS — H73012 Bullous myringitis, left ear: Secondary | ICD-10-CM | POA: Diagnosis not present

## 2017-01-03 DIAGNOSIS — H905 Unspecified sensorineural hearing loss: Secondary | ICD-10-CM | POA: Diagnosis not present

## 2017-01-03 DIAGNOSIS — H9042 Sensorineural hearing loss, unilateral, left ear, with unrestricted hearing on the contralateral side: Secondary | ICD-10-CM | POA: Diagnosis not present

## 2017-01-03 DIAGNOSIS — H9312 Tinnitus, left ear: Secondary | ICD-10-CM | POA: Insufficient documentation

## 2017-01-03 DIAGNOSIS — H903 Sensorineural hearing loss, bilateral: Secondary | ICD-10-CM | POA: Insufficient documentation

## 2017-01-04 ENCOUNTER — Ambulatory Visit: Payer: BLUE CROSS/BLUE SHIELD | Admitting: Internal Medicine

## 2017-01-04 DIAGNOSIS — Z0289 Encounter for other administrative examinations: Secondary | ICD-10-CM

## 2017-02-14 ENCOUNTER — Encounter: Payer: BLUE CROSS/BLUE SHIELD | Admitting: Internal Medicine

## 2017-03-15 ENCOUNTER — Encounter: Payer: Self-pay | Admitting: Internal Medicine

## 2017-03-15 ENCOUNTER — Ambulatory Visit: Payer: BLUE CROSS/BLUE SHIELD | Admitting: Internal Medicine

## 2017-03-15 DIAGNOSIS — R635 Abnormal weight gain: Secondary | ICD-10-CM

## 2017-03-15 MED ORDER — PHENTERMINE HCL 37.5 MG PO CAPS: 37.5000 mg | ORAL_CAPSULE | ORAL | 0 refills | Status: DC

## 2017-03-15 NOTE — Progress Notes (Signed)
Subjective:    Patient ID: Krystal Zamora, female    DOB: 08/19/1980, 36 y.o.   MRN: [id number]  HPI  Pt presents to the clinic today requesting to restart Phentermine. She has gained 27 lbs in the last year. She has Zamora working a lot, has Zamora eating a lot of fast food. She has not Zamora exercising. Her weight today is 257 lbs with a BMI of 40.86.  Breakfast: Fast Food Lunch: Fast Food Dinner: Pasta, Chicken and Rice etc Exercise: None  Review of Systems  Past Medical History:  Diagnosis Date  . Aspiration of vomit 2004  . Chlamydia 12/11/13   During pregnancy, diagnosed at 536w4d, PPROM on 7215w5d  . 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 of herion  . Hepatitis C   . Hepatitis C virus 2004   s/p one month interferon treatment   . Hx of abnormal Pap smear 1997   s/p LEEP procedure.  . Obesity     Current Outpatient Medications  Medication Sig Dispense Refill  . ibuprofen (ADVIL,MOTRIN) 800 MG tablet Take 1 tablet (800 mg total) by mouth every 8 (eight) hours as needed for moderate pain. (Patient not taking: Reported on 03/15/2017) 15 tablet 0   Current Facility-Administered Medications  Medication Dose Route Frequency Provider Last Rate Last Dose  . PARAGARD INTRAUTERINE COPPER IUD 1 Units  1 Units Intrauterine Once Anyanwu, Ugonna A, MD        Allergies  Allergen Reactions  . Sulfa Antibiotics Hives    Family History  Adopted: Yes  Problem Relation Age of Onset  . Other Neg Hx     Social History   Socioeconomic History  . Marital status: Married    Spouse name: Not on file  . Number of children: Not on file  . Years of education: Not on file  . Highest education level: Not on file  Social Needs  . Financial resource strain: Not on file  . Food insecurity - worry: Not on file  . Food insecurity - inability: Not on file  . Transportation needs - medical: Not on file  . Transportation needs - non-medical: Not on file  Occupational History  . Not on  file  Tobacco Use  . Smoking status: Current Some Day Smoker    Packs/day: 0.10    Years: 1.00    Pack years: 0.10    Types: Cigarettes    Last attempt to quit: 12/12/2005    Years since quitting: 11.2  . Smokeless tobacco: Never Used  . Tobacco comment: 2 cigarettes per week  Substance and Sexual Activity  . Alcohol use: Yes    Alcohol/week: 0.0 oz    Comment: no heroin use since 2004 after detox. occasional alcohol use  . Drug use: No  . Sexual activity: Yes    Partners: Male    Birth control/protection: IUD  Other Topics Concern  . Not on file  Social History Narrative  . Not on file     Constitutional: Pt reports weight gain. Denies fever, malaise, fatigue, headache.  Respiratory: Denies difficulty breathing, shortness of breath, cough or sputum production.   Cardiovascular: Denies chest pain, chest tightness, palpitations or swelling in the hands or feet.   No other specific complaints in a complete review of systems (except as listed in HPI above).     Objective:   Physical Exam   BP 130/82   Pulse 79   Temp 98.1 F (36.7 C) (Oral)   Wt  257 lb (116.6 kg)   LMP 03/13/2017   SpO2 97%   BMI 40.86 kg/m  Wt Readings from Last 3 Encounters:  03/15/17 257 lb (116.6 kg)  12/27/16 235 lb (106.6 kg)  11/29/16 235 lb (106.6 kg)    General: Appears her stated age, obese in NAD. Cardiovascular: Normal rate and rhythm. S1,S2 noted.  No murmur, rubs or gallops noted. No JVD or BLE edema. No carotid bruits noted. Pulmonary/Chest: Normal effort and positive vesicular breath sounds. No respiratory distress. No wheezes, rales or ronchi noted.  Neurological: Alert and oriented.     BMET    Component Value Date/Time   NA 135 (L) 10/27/2013 1453   K 3.6 (L) 10/27/2013 1453   CL 101 10/27/2013 1453   CO2 22 10/27/2013 1453   GLUCOSE 94 10/27/2013 1453   BUN 9 10/27/2013 1453   CREATININE 0.40 (L) 10/27/2013 1453   CREATININE 0.59 11/29/2011 1621   CALCIUM 9.3  10/27/2013 1453   GFRNONAA >90 10/27/2013 1453   GFRNONAA >89 11/29/2011 1621   GFRAA >90 10/27/2013 1453   GFRAA >89 11/29/2011 1621    Lipid Panel  No results found for: CHOL, TRIG, HDL, CHOLHDL, VLDL, LDLCALC  CBC    Component Value Date/Time   WBC 10.5 12/12/2013 1030   RBC 3.99 12/12/2013 1030   HGB 13.5 12/12/2013 1030   HCT 37.4 12/12/2013 1030   PLT 239 12/12/2013 1030   MCV 93.7 12/12/2013 1030   MCH 33.8 12/12/2013 1030   MCHC 36.1 (H) 12/12/2013 1030   RDW 12.8 12/12/2013 1030   LYMPHSABS 2.8 10/27/2013 1453   MONOABS 0.8 10/27/2013 1453   EOSABS 0.4 10/27/2013 1453   BASOSABS 0.0 10/27/2013 1453    Hgb A1C No results found for: HGBA1C         Assessment & Plan:   Abnormal Weight Gain:  Discussed the importance of a low carb, high protein, high fat diet and exercise for weight loss She plans to also cut out alcohol RX for Phentermine 37.5 mg provided today  Discussed the importance of routine followup, schedule an appt for 1 month. Nicki ReaperBAITY, REGINA, NP

## 2017-03-15 NOTE — Patient Instructions (Signed)

## 2017-04-09 ENCOUNTER — Encounter: Payer: BLUE CROSS/BLUE SHIELD | Admitting: Internal Medicine

## 2017-05-14 ENCOUNTER — Encounter: Payer: Self-pay | Admitting: Internal Medicine

## 2017-05-14 ENCOUNTER — Ambulatory Visit (INDEPENDENT_AMBULATORY_CARE_PROVIDER_SITE_OTHER): Payer: BLUE CROSS/BLUE SHIELD | Admitting: Internal Medicine

## 2017-05-14 ENCOUNTER — Other Ambulatory Visit (HOSPITAL_COMMUNITY)
Admission: RE | Admit: 2017-05-14 | Discharge: 2017-05-14 | Disposition: A | Discharge status: Home / Self Care | Payer: BLUE CROSS/BLUE SHIELD | Source: Ambulatory Visit | Attending: Internal Medicine | Admitting: Internal Medicine

## 2017-05-14 VITALS — BP 124/86 | HR 68 | Temp 97.8°F | Ht 66.5 in | Wt 262.2 lb

## 2017-05-14 DIAGNOSIS — R21 Rash and other nonspecific skin eruption: Secondary | ICD-10-CM | POA: Diagnosis not present

## 2017-05-14 DIAGNOSIS — Z0001 Encounter for general adult medical examination with abnormal findings: Secondary | ICD-10-CM

## 2017-05-14 DIAGNOSIS — Z01419 Encounter for gynecological examination (general) (routine) without abnormal findings: Secondary | ICD-10-CM | POA: Diagnosis not present

## 2017-05-14 MED ORDER — CLOBETASOL PROPIONATE 0.05 % EX CREA: 1.0000 "application " | TOPICAL_CREAM | Freq: Two times a day (BID) | CUTANEOUS | 0 refills | Status: DC

## 2017-05-14 NOTE — Patient Instructions (Signed)

## 2017-05-14 NOTE — Progress Notes (Signed)
Subjective:    Patient ID: Krystal Zamora, female    DOB: 09/16/1980, 37 y.o.   MRN: [id number]  HPI  Pt presents to the clinic today for her annual exam.  Flu: 12/2013 Tetanus: 10/2013 Pap Smear: 05/2013 Dentist: biannually  Diet: She does eat meat. She consumes fruits and veggies daily. She tries to avoid fried foods. She drinks mostly water.  Exercise: She joined the gym last week.  Review of Systems      Past Medical History:  Diagnosis Date  . Aspiration of vomit 2004  . Chlamydia 12/11/13   During pregnancy, diagnosed at [redacted]w[redacted]d, PPROM on [redacted]w[redacted]d  . 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 of herion  . Hepatitis C   . Hepatitis C virus 2004   s/p one month interferon treatment   . Hx of abnormal Pap smear 1997   s/p LEEP procedure.  . Obesity     Current Outpatient Medications  Medication Sig Dispense Refill  . ibuprofen (ADVIL,MOTRIN) 800 MG tablet Take 1 tablet (800 mg total) by mouth every 8 (eight) hours as needed for moderate pain. (Patient not taking: Reported on 03/15/2017) 15 tablet 0  . phentermine 37.5 MG capsule Take 1 capsule (37.5 mg total) by mouth every morning. Reported on 05/10/2015 30 capsule 0   Current Facility-Administered Medications  Medication Dose Route Frequency Provider Last Rate Last Dose  . PARAGARD INTRAUTERINE COPPER IUD 1 Units  1 Units Intrauterine Once Anyanwu, Ugonna A, MD        Allergies  Allergen Reactions  . Sulfa Antibiotics Hives    Family History  Adopted: Yes  Problem Relation Age of Onset  . Other Neg Hx     Social History   Socioeconomic History  . Marital status: Married    Spouse name: Not on file  . Number of children: Not on file  . Years of education: Not on file  . Highest education level: Not on file  Social Needs  . Financial resource strain: Not on file  . Food insecurity - worry: Not on file  . Food insecurity - inability: Not on file  . Transportation needs - medical: Not on file  .  Transportation needs - non-medical: Not on file  Occupational History  . Not on file  Tobacco Use  . Smoking status: Current Some Day Smoker    Packs/day: 0.10    Years: 1.00    Pack years: 0.10    Types: Cigarettes    Last attempt to quit: 12/12/2005    Years since quitting: 11.4  . Smokeless tobacco: Never Used  . Tobacco comment: 2 cigarettes per week  Substance and Sexual Activity  . Alcohol use: Yes    Alcohol/week: 0.0 oz    Comment: no heroin use since 2004 after detox. occasional alcohol use  . Drug use: No  . Sexual activity: Yes    Partners: Male    Birth control/protection: IUD  Other Topics Concern  . Not on file  Social History Narrative  . Not on file     Constitutional: Pt reports weight gain. Denies fever, malaise, fatigue, headache.  HEENT: Denies eye pain, eye redness, ear pain, ringing in the ears, wax buildup, runny nose, nasal congestion, bloody nose, or sore throat. Respiratory: Denies difficulty breathing, shortness of breath, cough or sputum production.   Cardiovascular: Denies chest pain, chest tightness, palpitations or swelling in the hands or feet.  Gastrointestinal: Denies abdominal pain, bloating, constipation, diarrhea or blood in  the stool.  GU: Denies urgency, frequency, pain with urination, burning sensation, blood in urine, odor or discharge. Musculoskeletal: Denies decrease in range of motion, difficulty with gait, muscle pain or joint pain and swelling.  Skin: Pt reports rash on arms. Denies lesions or ulcercations.  Neurological: Denies dizziness, difficulty with memory, difficulty with speech or problems with balance and coordination.  Psych: Denies anxiety, depression, SI/HI.  No other specific complaints in a complete review of systems (except as listed in HPI above).  Objective:   Physical Exam   BP 124/86   Pulse 68   Temp 97.8 F (36.6 C) (Oral)   Ht 5' 6.5" (1.689 m)   Wt 262 lb 4 oz (119 kg)   LMP 04/22/2017 (Approximate)    SpO2 97%   BMI 41.69 kg/m  Wt Readings from Last 3 Encounters:  05/14/17 262 lb 4 oz (119 kg)  03/15/17 257 lb (116.6 kg)  12/27/16 235 lb (106.6 kg)    General: Appears her stated age, obese in NAD. Skin: Scattered scaly papular lesions noted on bilateral forearms. HEENT: Head: normal shape and size; Eyes: sclera white, no icterus, conjunctiva pink, PERRLA and EOMs intact; Ears: Tm's gray and intact, normal light reflex; Throat/Mouth: Teeth present, mucosa pink and moist, no exudate, lesions or ulcerations noted.  Neck:  Neck supple, trachea midline. No masses, lumps or thyromegaly present.  Cardiovascular: Normal rate and rhythm. S1,S2 noted.  No murmur, rubs or gallops noted. No JVD or BLE edema.  Pulmonary/Chest: Normal effort and positive vesicular breath sounds. No respiratory distress. No wheezes, rales or ronchi noted.  Abdomen: Soft and nontender. Normal bowel sounds. No distention or masses noted. Liver, spleen and kidneys non palpable. Pelvic: Normal female anatomy. Cervix not visualized. No discharge but fishy odor present. Adnexa nonpalpable. Musculoskeletal: Strength 5/5 BUE/BLE. No difficulty with gait.  Neurological: Alert and oriented. Cranial nerves II-XII grossly intact. Coordination normal.  Psychiatric: Mood and affect normal. Behavior is normal. Judgment and thought content normal.    BMET    Component Value Date/Time   NA 135 (L) 10/27/2013 1453   K 3.6 (L) 10/27/2013 1453   CL 101 10/27/2013 1453   CO2 22 10/27/2013 1453   GLUCOSE 94 10/27/2013 1453   BUN 9 10/27/2013 1453   CREATININE 0.40 (L) 10/27/2013 1453   CREATININE 0.59 11/29/2011 1621   CALCIUM 9.3 10/27/2013 1453   GFRNONAA >90 10/27/2013 1453   GFRNONAA >89 11/29/2011 1621   GFRAA >90 10/27/2013 1453   GFRAA >89 11/29/2011 1621    Lipid Panel  No results found for: CHOL, TRIG, HDL, CHOLHDL, VLDL, LDLCALC  CBC    Component Value Date/Time   WBC 10.5 12/12/2013 1030   RBC 3.99  12/12/2013 1030   HGB 13.5 12/12/2013 1030   HCT 37.4 12/12/2013 1030   PLT 239 12/12/2013 1030   MCV 93.7 12/12/2013 1030   MCH 33.8 12/12/2013 1030   MCHC 36.1 (H) 12/12/2013 1030   RDW 12.8 12/12/2013 1030   LYMPHSABS 2.8 10/27/2013 1453   MONOABS 0.8 10/27/2013 1453   EOSABS 0.4 10/27/2013 1453   BASOSABS 0.0 10/27/2013 1453    Hgb A1C No results found for: HGBA1C         Assessment & Plan:   Preventative Health Maintenance:  She declines flu shot Tetanus UTD Pap smear today, including STD screening Encouraged her to consume a balanced diet and exercise regimen Advised her to see a dentist annually Will check CBC, CMET, Lipid, TSH and  A1C today  Rash:  eRx for Clobetasol cream to affected area Consider dermatology eval if no improvement  RTC in 1 year, sooner if needed Nicki ReaperBAITY, Kinsley Holderman, NP

## 2017-05-15 LAB — LIPID PANEL
Cholesterol: 166 mg/dL (ref 0–200)
HDL: 58.3 mg/dL (ref >39.00)
NonHDL: 107.89
Total CHOL/HDL Ratio: 3
Triglycerides: 305 mg/dL — ABNORMAL HIGH (ref 0.0–149.0)
VLDL: 61 mg/dL — ABNORMAL HIGH (ref 0.0–40.0)

## 2017-05-15 LAB — CBC
HCT: 40.2 % (ref 36.0–46.0)
Hemoglobin: 13.9 g/dL (ref 12.0–15.0)
MCHC: 34.6 g/dL (ref 30.0–36.0)
MCV: 94.7 fl (ref 78.0–100.0)
Platelets: 279 10*3/uL (ref 150.0–400.0)
RBC: 4.24 Mil/uL (ref 3.87–5.11)
RDW: 12.6 % (ref 11.5–15.5)
WBC: 9 10*3/uL (ref 4.0–10.5)

## 2017-05-15 LAB — COMPREHENSIVE METABOLIC PANEL
ALT: 20 U/L (ref 0–35)
AST: 18 U/L (ref 0–37)
Albumin: 3.7 g/dL (ref 3.5–5.2)
Alkaline Phosphatase: 58 U/L (ref 39–117)
BUN: 17 mg/dL (ref 6–23)
CO2: 26 mEq/L (ref 19–32)
Calcium: 9.3 mg/dL (ref 8.4–10.5)
Chloride: 104 mEq/L (ref 96–112)
Creatinine, Ser: 0.73 mg/dL (ref 0.40–1.20)
GFR: 94.44 mL/min (ref >60.00)
Glucose, Bld: 106 mg/dL — ABNORMAL HIGH (ref 70–99)
Potassium: 4.2 mEq/L (ref 3.5–5.1)
Sodium: 138 mEq/L (ref 135–145)
Total Bilirubin: 0.3 mg/dL (ref 0.2–1.2)
Total Protein: 7.2 g/dL (ref 6.0–8.3)

## 2017-05-15 LAB — HEMOGLOBIN A1C: Hgb A1c MFr Bld: 5.4 % (ref 4.6–6.5)

## 2017-05-15 LAB — LDL CHOLESTEROL, DIRECT: Direct LDL: 84 mg/dL

## 2017-05-15 LAB — TSH: TSH: 1.08 u[IU]/mL (ref 0.35–4.50)

## 2017-05-20 ENCOUNTER — Telehealth: Payer: Self-pay | Admitting: Internal Medicine

## 2017-05-20 LAB — CERVICOVAGINAL ANCILLARY ONLY: Herpes: NEGATIVE

## 2017-05-20 NOTE — Telephone Encounter (Signed)
Pt is aware the results are not all back yet and she will be notified once received

## 2017-05-20 NOTE — Telephone Encounter (Signed)
Per result notes under lab tab for pap done on 05/14/17 I do not see where pap results are back yet.Please advise.

## 2017-05-20 NOTE — Telephone Encounter (Signed)
Copied from CRM [phone]. Topic: Quick Communication - See Telephone Encounter >> May 20, 2017  1:46 PM Floria RavelingStovall, Shana A wrote: CRM for notification. See Telephone encounter for: pt called in and stated that she has some question about her pap results .  She would like CMA to call her   Best number [phone]  05/20/17.

## 2017-05-21 ENCOUNTER — Other Ambulatory Visit: Payer: Self-pay | Admitting: Internal Medicine

## 2017-05-21 DIAGNOSIS — R87629 Unspecified abnormal cytological findings in specimens from vagina: Secondary | ICD-10-CM

## 2017-05-21 DIAGNOSIS — N870 Mild cervical dysplasia: Secondary | ICD-10-CM

## 2017-05-21 LAB — CYTOLOGY - PAP
Adequacy: ABSENT — AB
Chlamydia: NEGATIVE
HPV: NOT DETECTED
Neisseria Gonorrhea: NEGATIVE
Trichomonas: NEGATIVE

## 2017-07-22 ENCOUNTER — Encounter: Payer: BLUE CROSS/BLUE SHIELD | Admitting: Obstetrics and Gynecology

## 2017-07-22 ENCOUNTER — Encounter: Payer: Self-pay | Admitting: Obstetrics and Gynecology

## 2017-07-22 DIAGNOSIS — Z01419 Encounter for gynecological examination (general) (routine) without abnormal findings: Secondary | ICD-10-CM

## 2017-07-23 NOTE — Progress Notes (Signed)
Patient did not keep GYN appointment for 07/22/2017.  Krystal Zamora, Jr MD Attending [facility] (Faculty Practice)   

## 2017-07-30 ENCOUNTER — Telehealth: Payer: Self-pay

## 2017-07-30 NOTE — Telephone Encounter (Signed)
Left message for patient to call Shatasia Cutshaw back in regards to a referral-Rechelle Niebla V Vitali Seibert, RMA   

## 2017-10-29 ENCOUNTER — Encounter: Payer: Self-pay | Admitting: Physician Assistant

## 2017-10-29 ENCOUNTER — Ambulatory Visit: Payer: BLUE CROSS/BLUE SHIELD | Admitting: Physician Assistant

## 2017-10-29 ENCOUNTER — Ambulatory Visit (INDEPENDENT_AMBULATORY_CARE_PROVIDER_SITE_OTHER): Payer: BLUE CROSS/BLUE SHIELD

## 2017-10-29 VITALS — BP 120/90 | HR 94 | Temp 98.7°F | Resp 17 | Ht 66.5 in | Wt 267.0 lb

## 2017-10-29 DIAGNOSIS — R05 Cough: Secondary | ICD-10-CM | POA: Diagnosis not present

## 2017-10-29 DIAGNOSIS — R059 Cough, unspecified: Secondary | ICD-10-CM

## 2017-10-29 DIAGNOSIS — J22 Unspecified acute lower respiratory infection: Secondary | ICD-10-CM | POA: Diagnosis not present

## 2017-10-29 DIAGNOSIS — R079 Chest pain, unspecified: Secondary | ICD-10-CM | POA: Diagnosis not present

## 2017-10-29 MED ORDER — HYDROCODONE-HOMATROPINE 5-1.5 MG/5ML PO SYRP: 5.0000 mL | ORAL_SOLUTION | Freq: Three times a day (TID) | ORAL | 0 refills | Status: DC | PRN

## 2017-10-29 MED ORDER — ALBUTEROL SULFATE (2.5 MG/3ML) 0.083% IN NEBU: 2.5000 mg | INHALATION_SOLUTION | Freq: Once | RESPIRATORY_TRACT | Status: DC

## 2017-10-29 MED ORDER — BENZONATATE 100 MG PO CAPS: 100.0000 mg | ORAL_CAPSULE | Freq: Three times a day (TID) | ORAL | 0 refills | Status: DC | PRN

## 2017-10-29 MED ORDER — DOXYCYCLINE HYCLATE 100 MG PO TABS: 100.0000 mg | ORAL_TABLET | Freq: Two times a day (BID) | ORAL | 0 refills | Status: DC

## 2017-10-29 MED ORDER — IPRATROPIUM BROMIDE 0.02 % IN SOLN: 0.5000 mg | Freq: Once | RESPIRATORY_TRACT | Status: DC

## 2017-10-29 NOTE — Patient Instructions (Addendum)
Your x-ray shows pneumonia.  Stay well hydrated. Drink 1-2 liters water/daily.  Start taking Doxycycline antibiotic twice daily x 1 week. Take the ENTIRE course, even if you start to feel better.  Hycodan is a hydrocodone-based cough syrup. Take this as directed. This may make you drowsy.  Tessalon is for cough during the day.  You may have a residual cough for the next 2-3 weeks.  Come back if you are not improving.    Stay well hydrated. Get lost of rest. Wash your hands often.   -Foods that can help speed recovery: honey, garlic, chicken soup, elderberries, green tea.  -Supplements that can help speed recovery: vitamin C, zinc, elderberry extract, quercetin, ginseng, selenium -Supplement with prebiotics and probiotics:   Advil or ibuprofen for pain. Do not take Aspirin.  Drink enough water and fluids to keep your urine clear or pale yellow.  For sore throat: ? Gargle with 8 oz of salt water ( tsp of salt per 1 qt of water) as often as every 1-2 hours to soothe your throat.  Gargle liquid benadryl.  Cepacol throat lozenges (if you are not at risk for choking).  For sore throat try using a honey-based tea. Use 3 teaspoons of honey with juice squeezed from half lemon. Place shaved pieces of ginger into 1/2-1 cup of water and warm over stove top. Then mix the ingredients and repeat every 4 hours as needed.  Cough Syrup Recipe: Sweet Lemon & Honey Thyme  Ingredients a handful of fresh thyme sprigs   1 pint of water (2 cups)  1/2 cup honey (raw is best, but regular will do)  1/2 lemon chopped Instructions 1. Place the lemon in the pint jar and cover with the honey. The honey will macerate the lemons and draw out liquids which taste so delicious! 2. Meanwhile, toss the thyme leaves into a saucepan and cover them with the water. 3. Bring the water to a gentle simmer and reduce it to half, about a cup of tea. 4. When the tea is reduced and cooled a bit, strain the sprigs & leaves, add  it into the pint jar and stir it well. 5. Give it a shake and use a spoonful as needed. 6. Store your homemade cough syrup in the refrigerator for about a month.  What causes a cough? In adults, common causes of a cough include: ?An infection of the airways or lungs (such as the common cold) ?Postnasal drip - Postnasal drip is when mucus from the nose drips down or flows along the back of the throat. Postnasal drip can happen when people have: .A cold .Allergies .A sinus infection - The sinuses are hollow areas in the bones of the face that open into the nose. ?Lung conditions, like asthma and chronic obstructive pulmonary disease (COPD) - Both of these conditions can make it hard to breathe. COPD is usually caused by smoking. ?Acid reflux - Acid reflux is when the acid that is normally in your stomach backs up into your esophagus (the tube that carries food from your mouth to your stomach). ?A side effect from blood pressure medicines called "ACE inhibitors" ?Smoking cigarettes  Is there anything I can do on my own to get rid of my cough? Yes. To help get rid of your cough, you can: ?Use a humidifier in your bedroom ?Use an over-the-counter cough medicine, or suck on cough drops or hard candy ?Stop smoking, if you smoke ?If you have allergies, avoid the things you are  allergic to (like pollen, dust, animals, or mold) If you have acid reflux, your doctor or nurse will tell you which lifestyle changes can help reduce symptoms.    Doxycycline oral tablets (Periodontitis) What is this medicine? DOXYCYCLINE (dox i SYE kleen) is a tetracycline antibiotic. It kills certain bacteria or stops their growth. This medicine is used to treat a dental infection called periodontitis. This medicine may be used for other purposes; ask your health care provider or pharmacist if you have questions. COMMON BRAND NAME(S): Oraxyl, Periostat What should I tell my health care provider before I take this  medicine? They need to know if you have any of these conditions: -liver disease -long exposure to sunlight like working outdoors -stomach problems like colitis -an unusual or allergic reaction to doxycycline, tetracycline antibiotics, other medicines, foods, dyes, or preservatives -pregnant or trying to get pregnant -breast-feeding How should I use this medicine? Take this medicine by mouth with a full glass of water. Follow the directions on the prescription label. Take this medicine at least 1 hour before or 2 hours after food. Take your medicine at regular intervals. Do not take your medicine more often than directed. Take all of your medicine as directed even if you think you are better. Do not skip doses or stop your medicine early. Talk to your pediatrician regarding the use of this medicine in children. Special care may be needed. Overdosage: If you think you have taken too much of this medicine contact a poison control center or emergency room at once. NOTE: This medicine is only for you. Do not share this medicine with others. What if I miss a dose? If you miss a dose, take it as soon as you can. If it is almost time for your next dose, take only that dose. Do not take double or extra doses. What may interact with this medicine? -antacids -barbiturates -birth control pills -bismuth subsalicylate -carbamazepine -methoxyflurane -other antibiotics -phenytoin -vitamins that contain iron -warfarin This list may not describe all possible interactions. Give your health care provider a list of all the medicines, herbs, non-prescription drugs, or dietary supplements you use. Also tell them if you smoke, drink alcohol, or use illegal drugs. Some items may interact with your medicine. What should I watch for while using this medicine? Tell your doctor or health care professional if your symptoms do not improve. Do not treat diarrhea with over the counter products. Contact your doctor if  you have diarrhea that lasts more than 2 days or if it is severe and watery. Do not take this medicine just before going to bed. It may not dissolve properly when you lay down and can cause pain in your throat. Drink plenty of fluids while taking this medicine to also help reduce irritation in your throat. This medicine can make you more sensitive to the sun. Keep out of the sun. If you cannot avoid being in the sun, wear protective clothing and use sunscreen. Do not use sun lamps or tanning beds/booths. Birth control pills may not work properly while you are taking this medicine. Talk to your doctor about using an extra method of birth control. If you are being treated for a sexually transmitted infection, avoid sexual contact until you have finished your treatment. Your sexual partner may also need treatment. Avoid antacids, aluminum, calcium, magnesium, and iron products for 4 hours before and 2 hours after taking a dose of this medicine. What side effects may I notice from receiving this medicine? Side  effects that you should report to your doctor or health care professional as soon as possible: -allergic reactions like skin rash, itching or hives, swelling of the face, lips, or tongue -difficulty breathing -fever -itching in the rectal or genital area -pain on swallowing -redness, blistering, peeling or loosening of the skin, including inside the mouth -severe stomach pain or cramps -unusual bleeding or bruising -unusually weak or tired -yellowing of the eyes or skin Side effects that usually do not require medical attention (report to your doctor or health care professional if they continue or are bothersome): -diarrhea -loss of appetite -nausea, vomiting This list may not describe all possible side effects. Call your doctor for medical advice about side effects. You may report side effects to FDA at 1-800-FDA-1088. Where should I keep my medicine? Keep out of the reach of  children. Store at room temperature between 15 and 30 degrees C (59 and 86 degrees F). Protect from light. Keep container tightly closed. Throw away any unused medicine after the expiration date. Taking this medicine after the expiration date can make you seriously ill. NOTE: This sheet is a summary. It may not cover all possible information. If you have questions about this medicine, talk to your doctor, pharmacist, or health care provider.  2018 Elsevier/Gold Standard (2007-07-17 14:50:34)  IF you received an x-ray today, you will receive an invoice from [facility]. Please contact [facility] at [phone] with questions or concerns regarding your invoice.   IF you received labwork today, you will receive an invoice from [facility]. Please contact [facility] at [phone] with questions or concerns regarding your invoice.   Our billing staff will not be able to assist you with questions regarding bills from these companies.  You will be contacted with the lab results as soon as they are available. The fastest way to get your results is to activate your My Chart account. Instructions are located on the last page of this paperwork. If you have not heard from [place] regarding the results in 2 weeks, please contact this office.

## 2017-10-29 NOTE — Progress Notes (Signed)
Krystal Zamora  MRN: [id number] DOB: 03/11/1981  PCP: Lorre MunroeBaity, Regina W, NP  Subjective:  Pt is a 37 year old female who  has a past medical history of Drug addiction in remission (HCC) and Hepatitis C. who presents to clinic for cough and congestion x 3 days.  Cough is not productive. Feels like she needs to cough something up.   Chest and HA "really bad'. Chest pain is center of her chest. Worse today.  "sometimes it feels like I have to yawn to get a big enough breath".  Last night she was shivering. This morning woke up and feel like "I got hit by a bus". Denies diaphoresis, fever, n/v, palpitations, pain with inspiration, hemoptysis, shob.  She has not taken anything to feel better.   Smokes 3-4 cigarettes/day.  No recent antibiotic use.  No known sick contact.   Review of Systems  Constitutional: Positive for chills and fatigue. Negative for diaphoresis and fever.  HENT: Positive for congestion. Negative for postnasal drip, rhinorrhea, sinus pressure, sinus pain, sore throat and trouble swallowing.   Respiratory: Positive for cough. Negative for shortness of breath and wheezing.   Cardiovascular: Positive for chest pain. Negative for palpitations.  Gastrointestinal: Negative for nausea and vomiting.  Psychiatric/Behavioral: Negative for sleep disturbance.    There are no active problems to display for this patient.   No current outpatient medications on file prior to visit.   Current Facility-Administered Medications on File Prior to Visit  Medication Dose Route Frequency Provider Last Rate Last Dose  . PARAGARD INTRAUTERINE COPPER IUD 1 Units  1 Units Intrauterine Once Anyanwu, Ugonna A, MD        Allergies  Allergen Reactions  . Sulfa Antibiotics Hives     Objective:  BP 120/90 (BP Location: Left Arm, Cuff Size: Large)   Pulse 94   Temp 98.7 F (37.1 C) (Oral)   Resp 17   Ht 5' 6.5" (1.689 m)   Wt 267 lb (121.1 kg)   SpO2 98%   BMI 42.45 kg/m  Peak flow  320.,   Physical Exam  Constitutional: She is oriented to person, place, and time. No distress.  HENT:  Right Ear: Tympanic membrane normal.  Left Ear: Tympanic membrane normal.  Nose: Mucosal edema present. No rhinorrhea. Right sinus exhibits no maxillary sinus tenderness and no frontal sinus tenderness. Left sinus exhibits no maxillary sinus tenderness and no frontal sinus tenderness.  Mouth/Throat: Oropharynx is clear and moist and mucous membranes are normal.  Cardiovascular: Normal rate, regular rhythm and normal heart sounds.  Pulmonary/Chest: Effort normal and breath sounds normal. No respiratory distress. She has no wheezes. She has no rhonchi. She has no rales.  Neurological: She is alert and oriented to person, place, and time.  Skin: Skin is warm and dry.  Psychiatric: Judgment normal.  Vitals reviewed.  Dg Chest 2 View  Result Date: 10/29/2017 CLINICAL DATA:  Cough chills and chest pain 3 days EXAM: CHEST - 2 VIEW COMPARISON:  12/13/2010 FINDINGS: Mild patchy bibasilar airspace disease has developed since the prior study may represent pneumonia. Heart size and vascularity normal. No pleural effusion. Progressive disc degeneration and kyphosis at approximately T12-L1 IMPRESSION: Mild bibasilar airspace disease, possible pneumonia. Electronically Signed   By: Marlan Palauharles  Clark M.D.   On: 10/29/2017 12:39   Assessment and Plan :  1. Lower respiratory infection 2. Cough - pt is a 37 year old female who presents c/o cough and chills x 3 days. Vitals are stable.  X-ray shows possible pneumonia, plan to treat for lower respiratory infection. RTC in 5-7 days if no improvement. Encouraged hydration and rest.  - doxycycline (VIBRA-TABS) 100 MG tablet; Take 1 tablet (100 mg total) by mouth 2 (two) times daily.  Dispense: 14 tablet; Refill: 0 - DG Chest 2 View; Future - HYDROcodone-homatropine (HYCODAN) 5-1.5 MG/5ML syrup; Take 5 mLs by mouth every 8 (eight) hours as needed for cough.   Dispense: 120 mL; Refill: 0 - benzonatate (TESSALON) 100 MG capsule; Take 1-2 capsules (100-200 mg total) by mouth 3 (three) times daily as needed for cough.  Dispense: 40 capsule; Refill: 0   Marco Collie, PA-C  [facility] 10/29/2017 12:07 PM  Please note: Portions of this report may have been transcribed using dragon voice recognition software. Every effort was made to ensure accuracy; however, inadvertent computerized transcription errors may be present.

## 2017-10-31 ENCOUNTER — Ambulatory Visit: Payer: BLUE CROSS/BLUE SHIELD | Admitting: Physician Assistant

## 2017-10-31 DIAGNOSIS — R062 Wheezing: Secondary | ICD-10-CM | POA: Diagnosis not present

## 2017-10-31 DIAGNOSIS — Z8701 Personal history of pneumonia (recurrent): Secondary | ICD-10-CM | POA: Diagnosis not present

## 2017-12-06 ENCOUNTER — Telehealth: Payer: Self-pay

## 2017-12-06 NOTE — Telephone Encounter (Signed)
Spoke to pt to let her know that all of her form has been filled out but they are also requesting waist circumference.... She will call back with measurement so that I can fax form

## 2017-12-06 NOTE — Telephone Encounter (Signed)
Copied from CRM [id number]. Topic: General - Other >> Dec 03, 2017  3:12 PM Ronney Lion A wrote: Reason for CRM: patient faxed over paperwork 11/28/2017 regarding her wellness for insurance. Please call patient back @ [phone].

## 2017-12-10 NOTE — Telephone Encounter (Signed)
Waist circumference is 41. Please call patient if any additional questions or concerns.

## 2017-12-11 NOTE — Telephone Encounter (Signed)
Form has been faxed and left message on voicemail asking pt if she wanted me to mail t to her or if she would like to pick up

## 2017-12-16 NOTE — Telephone Encounter (Signed)
Pt never returned my call, form mailed to pt

## 2018-04-14 ENCOUNTER — Encounter: Payer: Self-pay | Admitting: Internal Medicine

## 2018-04-14 ENCOUNTER — Ambulatory Visit: Payer: BLUE CROSS/BLUE SHIELD | Admitting: Internal Medicine

## 2018-04-14 VITALS — BP 126/84 | HR 120 | Temp 98.2°F | Wt 259.0 lb

## 2018-04-14 DIAGNOSIS — J101 Influenza due to other identified influenza virus with other respiratory manifestations: Secondary | ICD-10-CM

## 2018-04-14 DIAGNOSIS — R0981 Nasal congestion: Secondary | ICD-10-CM | POA: Diagnosis not present

## 2018-04-14 DIAGNOSIS — R52 Pain, unspecified: Secondary | ICD-10-CM | POA: Diagnosis not present

## 2018-04-14 DIAGNOSIS — R05 Cough: Secondary | ICD-10-CM

## 2018-04-14 DIAGNOSIS — R059 Cough, unspecified: Secondary | ICD-10-CM

## 2018-04-14 LAB — POC INFLUENZA A&B (BINAX/QUICKVUE)
Influenza A, POC: NEGATIVE
Influenza B, POC: POSITIVE — AB

## 2018-04-14 MED ORDER — HYDROCODONE-HOMATROPINE 5-1.5 MG/5ML PO SYRP: 5.0000 mL | ORAL_SOLUTION | Freq: Three times a day (TID) | ORAL | 0 refills | Status: DC | PRN

## 2018-04-14 MED ORDER — ALBUTEROL SULFATE HFA 108 (90 BASE) MCG/ACT IN AERS: 2.0000 | INHALATION_SPRAY | Freq: Four times a day (QID) | RESPIRATORY_TRACT | 0 refills | Status: DC | PRN

## 2018-04-14 NOTE — Addendum Note (Signed)
Addended by: Roena Malady on: 04/14/2018 04:57 PM   Modules accepted: Orders

## 2018-04-14 NOTE — Patient Instructions (Signed)

## 2018-04-14 NOTE — Progress Notes (Signed)
HPI  Pt presents to the clinic today with c/o headache, nasal congestion and cough. She reports this started 3 days ago. The headache is located her forehead. She describes the pain as pressure. She denies dizziness or visual changes. She is not blowing much out of her nose. The cough is productive of tan mucous. She denies fever, but has had chills and body aches. 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 She has had sick contacts at work. She did not get her flu shot.  Review of Systems      Past Medical History:  Diagnosis Date  . 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 of herion  . Hepatitis C     Family History  Adopted: Yes  Problem Relation Age of Onset  . Other Neg Hx     Social History   Socioeconomic History  . Marital status: Married    Spouse name: Not on file  . Number of children: Not on file  . Years of education: Not on file  . Highest education level: Not on file  Occupational History  . Not on file  Social Needs  . Financial resource strain: Not on file  . Food insecurity:    Worry: Not on file    Inability: Not on file  . Transportation needs:    Medical: Not on file    Non-medical: Not on file  Tobacco Use  . Smoking status: Current Some Day Smoker    Packs/day: 0.10    Years: 1.00    Pack years: 0.10    Types: Cigarettes    Last attempt to quit: 12/12/2005    Years since quitting: 12.3  . Smokeless tobacco: Never Used  . Tobacco comment: 2 cigarettes per week  Substance and Sexual Activity  . 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 occasional alcohol use  . Drug use: No  . Sexual activity: Yes    Partners: Male    Birth control/protection: I.U.D.  Lifestyle  . Physical activity:    Days per week: Not on file    Minutes per session: Not on file  . Stress: Not on file  Relationships  . Social connections:    Talks on phone: Not on file    Gets  together: Not on file    Attends religious service: Not on file    Active member of club or organization: Not on file    Attends meetings of clubs or organizations: Not on file    Relationship status: Not on file  . Intimate partner violence:    Fear of current or ex partner: Not on file    Emotionally abused: Not on file    Physically abused: Not on file    Forced sexual activity: Not on file  Other Topics Concern  . Not on file  Social History Narrative  . Not on file    Allergies  Allergen Reactions  . Sulfa Antibiotics Hives     Constitutional: Positive headache, fatigue and body aches. Denies fever or abrupt weight changes.  HEENT:  Positive nasal congestion. Denies eye redness, eye pain, pressure behind the eyes, facial pain, ear pain, ringing in the ears, wax buildup, runny nose or sore throat. Respiratory: Positive cough. Denies difficulty breathing or shortness of breath.  Cardiovascular: Denies chest pain, chest tightness, palpitations or swelling in the hands or feet.   No other  specific complaints in a complete review of systems (except as listed in HPI above).  Objective:   BP 126/84   Pulse (!) 120   Temp 98.2 F (36.8 C) (Oral)   Wt 259 lb (117.5 kg)   SpO2 96%   BMI 41.18 kg/m   Wt Readings from Last 3 Encounters:  04/14/18 259 lb (117.5 kg)  10/29/17 267 lb (121.1 kg)  05/14/17 262 lb 4 oz (119 kg)     General: Appears her stated age, ill appearing, in NAD. HEENT: Head: normal shape and size, no sinus tenderness noted; Ears: Tm's gray and intact, normal light reflex; Nose: mucosa boggy and moist, turbinates L>R; Throat/Mouth: + PND. Teeth present, mucosa erythematous and moist, no exudate noted, no lesions or ulcerations noted.  Neck: No cervical lymphadenopathy.  Cardiovascular: Tachcardic rhythm. S1,S2 noted.  No murmur, rubs or gallops noted.  Pulmonary/Chest: Normal effort and positive vesicular breath sounds. No respiratory distress. No  wheezes, rales or ronchi noted.       Assessment & Plan:   Nasal Congestion, Cough, Body Aches:  Rapid flu: positive for B Get some rest and drink plenty of water Start Flonase OTC eRx for Albuterol inhaler every 4-6 hours as needed eRx for Hycodan cough syrup  RTC as needed or if symptoms persist.   Nicki Reaper, NP

## 2018-05-02 ENCOUNTER — Telehealth: Payer: Self-pay | Admitting: Internal Medicine

## 2018-05-02 NOTE — Telephone Encounter (Signed)
Due to a recent move pt is requesting to transfer care from [facility] to Dr Ardyth Harps at [facility]. OK to transfer?  Copied from CRM [id number]. Topic: Appointment Scheduling - Transfer of Care >> May 01, 2018  4:24 PM Arlyss Gandy, NT wrote: Pt is requesting to transfer FROM: Nicki Reaper Pt is requesting to transfer TO: Dr. Ardyth Harps  Reason for requested transfer: Due to location as pt has moved.  Send CRM to patient's current PCP (transferring FROM).

## 2018-05-02 NOTE — Telephone Encounter (Signed)
Fine with me

## 2018-05-08 NOTE — Telephone Encounter (Signed)
Spoke with pt and advised TOC has been approved. She did not want to schedule today, she will call back. Explained she will need TOC visit prior to any CPE or acute visit. She voiced understanding. Nothing further needed at this time.

## 2018-06-11 DIAGNOSIS — L565 Disseminated superficial actinic porokeratosis (DSAP): Secondary | ICD-10-CM | POA: Diagnosis not present

## 2018-09-25 ENCOUNTER — Other Ambulatory Visit: Payer: Self-pay

## 2018-09-25 ENCOUNTER — Encounter: Payer: Self-pay | Admitting: Internal Medicine

## 2018-09-25 ENCOUNTER — Ambulatory Visit (INDEPENDENT_AMBULATORY_CARE_PROVIDER_SITE_OTHER): Payer: BC Managed Care – PPO | Admitting: Internal Medicine

## 2018-09-25 DIAGNOSIS — F1921 Other psychoactive substance dependence, in remission: Secondary | ICD-10-CM

## 2018-09-25 DIAGNOSIS — B1920 Unspecified viral hepatitis C without hepatic coma: Secondary | ICD-10-CM | POA: Diagnosis not present

## 2018-09-25 DIAGNOSIS — F909 Attention-deficit hyperactivity disorder, unspecified type: Secondary | ICD-10-CM

## 2018-09-25 NOTE — Progress Notes (Signed)
Virtual Visit via Video Note  I connected with Krystal Zamora on 09/25/18 at 10:00 AM EDT by a video enabled telemedicine application and verified that I am speaking with the correct person using two identifiers.  Location patient: home Location provider: work office Persons participating in the virtual visit: patient, provider  I discussed the limitations of evaluation and management by telemedicine and the availability of in person appointments. The patient expressed understanding and agreed to proceed.   HPI: She has scheduled this visit to establish care and follow up on chronic conditions. She would also like to resume Vyvanse. She was diagnosed with ADHD around June 2018. At that time she was seeing the [facility]. [place] and had no follow up with them and stopped taking the medication.  She has been having issues focusing at work and is concerned about losing her job. 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 She has a h/o Hep C: states she started treatment but never completed it. She smokes 2-5 cig a day and has done so since age 38. ETOH occasionally. She is adopted so does not know any of her family history. Is allergic to sulfa drugs that cause hives.   ROS: Constitutional: Denies fever, chills, diaphoresis, appetite change and fatigue.  HEENT: Denies photophobia, eye pain, redness, hearing loss, ear pain, congestion, sore throat, rhinorrhea, sneezing, mouth sores, trouble swallowing, neck pain, neck stiffness and tinnitus.   Respiratory: Denies SOB, DOE, cough, chest tightness,  and wheezing.   Cardiovascular: Denies chest pain, palpitations and leg swelling.  Gastrointestinal: Denies nausea, vomiting, abdominal pain, diarrhea, constipation, blood in stool and abdominal distention.  Genitourinary: Denies dysuria, urgency, frequency,  hematuria, flank pain and difficulty urinating.  Endocrine: Denies: hot or cold intolerance, sweats, changes in hair or nails, polyuria, polydipsia. Musculoskeletal: Denies myalgias, back pain, joint swelling, arthralgias and gait problem.  Skin: Denies pallor, rash and wound.  Neurological: Denies dizziness, seizures, syncope, weakness, light-headedness, numbness and headaches.  Hematological: Denies adenopathy. Easy bruising, personal or family bleeding history  Psychiatric/Behavioral: Denies suicidal ideation, mood changes, confusion, nervousness, sleep disturbance and agitation   Past Medical History:  Diagnosis Date  . 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 of herion  . Hepatitis C     Past Surgical History:  Procedure Laterality Date  . ASPIRATION BIOPSY     in ICU for aspiration of vomit for 7 weeks  . LIVER BIOPSY  2009  . WISDOM TOOTH EXTRACTION      Family History  Adopted: Yes  Problem Relation Age of Onset  . Other Neg Hx     SOCIAL HX:   reports that she has been smoking cigarettes. She has a 0.10 pack-year smoking history. She has never used smokeless tobacco. She reports current alcohol use. She reports that she does not use drugs.  No current outpatient medications on file.  Current Facility-Administered Medications:  .  PARAGARD INTRAUTERINE COPPER IUD 1 Units, 1 Units, Intrauterine, Once, Anyanwu, Ugonna A, MD  EXAM:   VITALS per patient if applicable: none reported  GENERAL: alert, oriented, appears well and in no acute distress  HEENT: atraumatic, conjunttiva clear, no obvious abnormalities on inspection of external nose and ears  NECK: normal movements of the head and neck  LUNGS: on inspection no signs of respiratory distress, breathing rate appears normal, no obvious  gross increased work of breathing, gasping or wheezing  CV: no obvious cyanosis  MS: moves all visible extremities without noticeable abnormality  PSYCH/NEURO: pleasant  and cooperative, no obvious depression or anxiety, speech and thought processing grossly intact  ASSESSMENT AND PLAN:   Attention deficit hyperactivity disorder (ADHD), unspecified ADHD type  -Will refer back to the [facility] for consideration of restarting treatment.  Drug addiction in remission ([place])  -Remote, no current use.  Hepatitis C virus infection without hepatic coma, unspecified chronicity -We need to check her viral load to make sure she has cleared the virus, otherwise would be at risk for cirrhosis and liver cancer.  Needs to schedule appointment for CPE.    I discussed the assessment and treatment plan with the patient. The patient was provided an opportunity to ask questions and all were answered. The patient agreed with the plan and demonstrated an understanding of the instructions.   The patient was advised to call back or seek an in-person evaluation if the symptoms worsen or if the condition fails to improve as anticipated.    Lelon Frohlich, MD   Primary Care at [facility]

## 2018-10-12 ENCOUNTER — Encounter: Payer: Self-pay | Admitting: Internal Medicine

## 2018-10-12 DIAGNOSIS — R635 Abnormal weight gain: Secondary | ICD-10-CM

## 2018-10-23 ENCOUNTER — Other Ambulatory Visit: Payer: Self-pay

## 2018-10-23 ENCOUNTER — Other Ambulatory Visit (HOSPITAL_COMMUNITY)
Admission: RE | Admit: 2018-10-23 | Discharge: 2018-10-23 | Disposition: A | Discharge status: Home / Self Care | Payer: BC Managed Care – PPO | Source: Ambulatory Visit | Attending: Internal Medicine | Admitting: Internal Medicine

## 2018-10-23 ENCOUNTER — Ambulatory Visit (INDEPENDENT_AMBULATORY_CARE_PROVIDER_SITE_OTHER): Payer: BC Managed Care – PPO | Admitting: Internal Medicine

## 2018-10-23 ENCOUNTER — Encounter: Payer: Self-pay | Admitting: Internal Medicine

## 2018-10-23 VITALS — BP 150/100 | HR 88 | Temp 99.4°F | Ht 67.0 in | Wt 260.2 lb

## 2018-10-23 DIAGNOSIS — Z113 Encounter for screening for infections with a predominantly sexual mode of transmission: Secondary | ICD-10-CM

## 2018-10-23 DIAGNOSIS — B1920 Unspecified viral hepatitis C without hepatic coma: Secondary | ICD-10-CM

## 2018-10-23 DIAGNOSIS — Z1239 Encounter for other screening for malignant neoplasm of breast: Secondary | ICD-10-CM

## 2018-10-23 DIAGNOSIS — Z0000 Encounter for general adult medical examination without abnormal findings: Secondary | ICD-10-CM | POA: Diagnosis not present

## 2018-10-23 DIAGNOSIS — Z01419 Encounter for gynecological examination (general) (routine) without abnormal findings: Secondary | ICD-10-CM | POA: Diagnosis not present

## 2018-10-23 DIAGNOSIS — I10 Essential (primary) hypertension: Secondary | ICD-10-CM | POA: Insufficient documentation

## 2018-10-23 DIAGNOSIS — F909 Attention-deficit hyperactivity disorder, unspecified type: Secondary | ICD-10-CM | POA: Diagnosis not present

## 2018-10-23 LAB — CBC WITH DIFFERENTIAL/PLATELET
Basophils Absolute: 0.1 K/uL (ref 0.0–0.1)
Basophils Relative: 0.9 % (ref 0.0–3.0)
Eosinophils Absolute: 0.7 K/uL (ref 0.0–0.7)
Eosinophils Relative: 9.9 % — ABNORMAL HIGH (ref 0.0–5.0)
HCT: 42.4 % (ref 36.0–46.0)
Hemoglobin: 14.6 g/dL (ref 12.0–15.0)
Lymphocytes Relative: 40 % (ref 12.0–46.0)
Lymphs Abs: 2.7 K/uL (ref 0.7–4.0)
MCHC: 34.4 g/dL (ref 30.0–36.0)
MCV: 95.5 fl (ref 78.0–100.0)
Monocytes Absolute: 0.6 K/uL (ref 0.1–1.0)
Monocytes Relative: 8.8 % (ref 3.0–12.0)
Neutro Abs: 2.7 K/uL (ref 1.4–7.7)
Neutrophils Relative %: 40.4 % — ABNORMAL LOW (ref 43.0–77.0)
Platelets: 287 K/uL (ref 150.0–400.0)
RBC: 4.43 Mil/uL (ref 3.87–5.11)
RDW: 12.7 % (ref 11.5–15.5)
WBC: 6.7 K/uL (ref 4.0–10.5)

## 2018-10-23 LAB — HEMOGLOBIN A1C: Hgb A1c MFr Bld: 5.2 % (ref 4.6–6.5)

## 2018-10-23 LAB — TSH: TSH: 1.39 u[IU]/mL (ref 0.35–4.50)

## 2018-10-23 LAB — LIPID PANEL
Cholesterol: 161 mg/dL (ref 0–200)
HDL: 57.7 mg/dL
LDL Cholesterol: 79 mg/dL (ref 0–99)
NonHDL: 103.06
Total CHOL/HDL Ratio: 3
Triglycerides: 122 mg/dL (ref 0.0–149.0)
VLDL: 24.4 mg/dL (ref 0.0–40.0)

## 2018-10-23 LAB — COMPREHENSIVE METABOLIC PANEL WITH GFR
ALT: 43 U/L — ABNORMAL HIGH (ref 0–35)
AST: 32 U/L (ref 0–37)
Albumin: 4.2 g/dL (ref 3.5–5.2)
Alkaline Phosphatase: 64 U/L (ref 39–117)
BUN: 7 mg/dL (ref 6–23)
CO2: 25 meq/L (ref 19–32)
Calcium: 10.2 mg/dL (ref 8.4–10.5)
Chloride: 104 meq/L (ref 96–112)
Creatinine, Ser: 0.84 mg/dL (ref 0.40–1.20)
GFR: 75.01 mL/min
Glucose, Bld: 95 mg/dL (ref 70–99)
Potassium: 3.8 meq/L (ref 3.5–5.1)
Sodium: 137 meq/L (ref 135–145)
Total Bilirubin: 0.8 mg/dL (ref 0.2–1.2)
Total Protein: 7.4 g/dL (ref 6.0–8.3)

## 2018-10-23 LAB — VITAMIN B12: Vitamin B-12: 454 pg/mL (ref 211–911)

## 2018-10-23 LAB — VITAMIN D 25 HYDROXY (VIT D DEFICIENCY, FRACTURES): VITD: 27.49 ng/mL — ABNORMAL LOW (ref 30.00–100.00)

## 2018-10-23 NOTE — Patient Instructions (Signed)
-Nice seeing you today!!  -Lab work today. Will let you know as soon as results are available.  -STOP taking phentermine as your blood pressure is very high today.  -Schedule follow up in 4 months for BP check.   Preventive Care 66-40 Years Old, Female Preventive care refers to visits with your health care provider and lifestyle choices that can promote health and wellness. This includes:  A yearly physical exam. This may also be called an annual well check.  Regular dental visits and eye exams.  Immunizations.  Screening for certain conditions.  Healthy lifestyle choices, such as eating a healthy diet, getting regular exercise, not using drugs or products that contain nicotine and tobacco, and limiting alcohol use. What can I expect for my preventive care visit? Physical exam Your health care provider will check your:  Height and weight. This may be used to calculate body mass index (BMI), which tells if you are at a healthy weight.  Heart rate and blood pressure.  Skin for abnormal spots. Counseling Your health care provider may ask you questions about your:  Alcohol, tobacco, and drug use.  Emotional well-being.  Home and relationship well-being.  Sexual activity.  Eating habits.  Work and work Statistician.  Method of birth control.  Menstrual cycle.  Pregnancy history. What immunizations do I need?  Influenza (flu) vaccine  This is recommended every year. Tetanus, diphtheria, and pertussis (Tdap) vaccine  You may need a Td booster every 10 years. Varicella (chickenpox) vaccine  You may need this if you have not been vaccinated. Zoster (shingles) vaccine  You may need this after age 40. Measles, mumps, and rubella (MMR) vaccine  You may need at least one dose of MMR if you were born in 1957 or later. You may also need a second dose. Pneumococcal conjugate (PCV13) vaccine  You may need this if you have certain conditions and were not previously  vaccinated. Pneumococcal polysaccharide (PPSV23) vaccine  You may need one or two doses if you smoke cigarettes or if you have certain conditions. Meningococcal conjugate (MenACWY) vaccine  You may need this if you have certain conditions. Hepatitis A vaccine  You may need this if you have certain conditions or if you travel or work in places where you may be exposed to hepatitis A. Hepatitis B vaccine  You may need this if you have certain conditions or if you travel or work in places where you may be exposed to hepatitis B. Haemophilus influenzae type b (Hib) vaccine  You may need this if you have certain conditions. Human papillomavirus (HPV) vaccine  If recommended by your health care provider, you may need three doses over 6 months. You may receive vaccines as individual doses or as more than one vaccine together in one shot (combination vaccines). Talk with your health care provider about the risks and benefits of combination vaccines. What tests do I need? Blood tests  Lipid and cholesterol levels. These may be checked every 5 years, or more frequently if you are over 40 years old.  Hepatitis C test.  Hepatitis B test. Screening  Lung cancer screening. You may have this screening every year starting at age 40 if you have a 30-pack-year history of smoking and currently smoke or have quit within the past 15 years.  Colorectal cancer screening. All adults should have this screening starting at age 40 and continuing until age 26. Your health care provider may recommend screening at age 40 if you are at increased risk.  You will have tests every 1-10 years, depending on your results and the type of screening test.  Diabetes screening. This is done by checking your blood sugar (glucose) after you have not eaten for a while (fasting). You may have this done every 1-3 years.  Mammogram. This may be done every 1-2 years. Talk with your health care provider about when you should start  having regular mammograms. This may depend on whether you have a family history of breast cancer.  BRCA-related cancer screening. This may be done if you have a family history of breast, ovarian, tubal, or peritoneal cancers.  Pelvic exam and Pap test. This may be done every 3 years starting at age 40. Starting at age 40, this may be done every 5 years if you have a Pap test in combination with an HPV test. Other tests  Sexually transmitted disease (STD) testing.  Bone density scan. This is done to screen for osteoporosis. You may have this scan if you are at high risk for osteoporosis. Follow these instructions at home: Eating and drinking  Eat a diet that includes fresh fruits and vegetables, whole grains, lean protein, and low-fat dairy.  Take vitamin and mineral supplements as recommended by your health care provider.  Do not drink alcohol if: ? Your health care provider tells you not to drink. ? You are pregnant, may be pregnant, or are planning to become pregnant.  If you drink alcohol: ? Limit how much you have to 0-1 drink a day. ? Be aware of how much alcohol is in your drink. In the [place] one drink equals one 12 oz bottle of beer (355 mL), one 5 oz glass of wine (148 mL), or one 1 oz glass of hard liquor (44 mL). Lifestyle  Take daily care of your teeth and gums.  Stay active. Exercise for at least 30 minutes on 5 or more days each week.  Do not use any products that contain nicotine or tobacco, such as cigarettes, e-cigarettes, and chewing tobacco. If you need help quitting, ask your health care provider.  If you are sexually active, practice safe sex. Use a condom or other form of birth control (contraception) in order to prevent pregnancy and STIs (sexually transmitted infections).  If told by your health care provider, take low-dose aspirin daily starting at age 14. What's next?  Visit your health care provider once a year for a well check visit.  Ask your health  care provider how often you should have your eyes and teeth checked.  Stay up to date on all vaccines. This information is not intended to replace advice given to you by your health care provider. Make sure you discuss any questions you have with your health care provider. Document Released: 04/15/2015 Document Revised: 11/28/2017 Document Reviewed: 11/28/2017 Elsevier Patient Education  2020 [employer].

## 2018-10-23 NOTE — Addendum Note (Signed)
Addended by: Westley Hummer B on: 10/23/2018 10:58 AM   Modules accepted: Orders

## 2018-10-23 NOTE — Progress Notes (Signed)
Established Patient Office Visit     CC/Reason for Visit: Annual preventive exam, STD screening, concern about elevated blood pressure, concerns about weight gain  HPI: Krystal Zamora is a 38 y.o. female who is coming in today for the above mentioned reasons. Past Medical History is significant for: ADHD for whom we have prescribed Vyvanse but she has not started taking it.  She has a remote history of IV drug abuse (heroin) in her 75s.  She has a history of hepatitis C; years ago she commenced treatment for it but did not complete it.  She smokes around 4 cigarettes a day, alcohol use is occasional.  She is adopted so does not know her family history.  She is allergic to sulfa drugs which cause hives.  Since I last saw her she was concerned about weight gain and saw the bariatric clinic who prescribed phentermine.  Since then her blood pressure has been elevated.  She would like to be referred to a weight loss clinic.  She also states that she and her husband were separated for about 7 months and just got back together, is requesting STD screening.  She has a ParaGard IUD for birth control.   Past Medical/Surgical History: Past Medical History:  Diagnosis Date  . Drug addiction in remission ([place])    detox. of herion  . Hepatitis C     Past Surgical History:  Procedure Laterality Date  . ASPIRATION BIOPSY     in ICU for aspiration of vomit for 7 weeks  . LIVER BIOPSY  2009  . WISDOM TOOTH EXTRACTION      Social History:  reports that she has been smoking cigarettes. She has a 0.10 pack-year smoking history. She has never used smokeless tobacco. She reports current alcohol use. She reports that she does not use drugs.  Allergies: Allergies  Allergen Reactions  . Sulfa Antibiotics Hives    Family History:  Family History  Adopted: Yes  Problem Relation Age of Onset  . Other Neg Hx     No current outpatient medications on file.  Current Facility-Administered  Medications:  .  PARAGARD INTRAUTERINE COPPER IUD 1 Units, 1 Units, Intrauterine, Once, Anyanwu, Ugonna A, MD  Review of Systems:  Constitutional: Denies fever, chills, diaphoresis, appetite change and fatigue.  HEENT: Denies photophobia, eye pain, redness, hearing loss, ear pain, congestion, sore throat, rhinorrhea, sneezing, mouth sores, trouble swallowing, neck pain, neck stiffness and tinnitus.   Respiratory: Denies SOB, DOE, cough, chest tightness,  and wheezing.   Cardiovascular: Denies chest pain, palpitations and leg swelling.  Gastrointestinal: Denies nausea, vomiting, abdominal pain, diarrhea, constipation, blood in stool and abdominal distention.  Genitourinary: Denies dysuria, urgency, frequency, hematuria, flank pain and difficulty urinating.  Endocrine: Denies: hot or cold intolerance, sweats, changes in hair or nails, polyuria, polydipsia. Musculoskeletal: Denies myalgias, back pain, joint swelling, arthralgias and gait problem.  Skin: Denies pallor, rash and wound.  Neurological: Denies dizziness, seizures, syncope, weakness, light-headedness, numbness and headaches.  Hematological: Denies adenopathy. Easy bruising, personal or family bleeding history  Psychiatric/Behavioral: Denies suicidal ideation, mood changes, confusion, nervousness, sleep disturbance and agitation    Physical Exam: Vitals:   10/23/18 0935  BP: (!) 150/100  Pulse: 88  Temp: 99.4 F (37.4 C)  TempSrc: Temporal  SpO2: 98%  Weight: 260 lb 3.2 oz (118 kg)  Height: _0  (1.702 m)    Body mass index is 40.75 kg/m.   Constitutional: NAD, calm, comfortable Eyes: PERRL, lids and  conjunctivae normal ENMT: Mucous membranes are moist. Posterior pharynx clear of any exudate or lesions. Normal dentition. Tympanic membrane is pearly white, no erythema or bulging. Neck: normal, supple, no masses, no thyromegaly Respiratory: clear to auscultation bilaterally, no wheezing, no crackles. Normal respiratory  effort. No accessory muscle use.  Cardiovascular: Regular rate and rhythm, no murmurs / rubs / gallops. No extremity edema. 2+ pedal pulses. No carotid bruits.  Abdomen: no tenderness, no masses palpated. No hepatosplenomegaly. Bowel sounds positive.  Musculoskeletal: no clubbing / cyanosis. No joint deformity upper and lower extremities. Good ROM, no contractures. Normal muscle tone.  Skin: no rashes, lesions, ulcers. No induration Neurologic: CN 2-12 grossly intact. Sensation intact, DTR normal. Strength 5/5 in all 4.  Psychiatric: Normal judgment and insight. Alert and oriented x 3. Normal mood.    Impression and Plan:  Encounter for preventive health examination  -She has routine eye and dental care. -Immunizations are up-to-date. -Screening labs to be done today. -Healthy lifestyle has been discussed in detail today.  She is discouraged as for the past 4 weeks she has been working out and eating healthier and has not lost any weight.  She has been using phentermine prescribed by the bariatric clinic. -Start breast cancer screening, request mammogram today. -Start routine colon cancer screening at age 9. -Pap smear done in office today.  Elevated blood pressure reading with diagnosis of hypertension  -Blood pressure today is 150/100, she does not have a diagnosis of hypertension. -Suspect this is related to recent addition of phentermine for weight loss as prescribed by the bariatric clinic. -Have strongly recommended that she stop use of phentermine. -Referral to healthy weight and wellness clinic has been placed for weight gain concerns.  Hepatitis C virus infection without hepatic coma, unspecified chronicity  -She never completed treatment for hepatitis C. -Will request hepatitis C RNA today to determine whether GI referral is needed.  Attention deficit hyperactivity disorder (ADHD), unspecified ADHD type -She is not using Vyvanse, have advised that she not take it given her  current elevated blood pressure.  Screen for STD (sexually transmitted disease) -Check HIV, RPR, hepatitis status, GC, chlamydia, trichomonas.   Patient Instructions  -Nice seeing you today!!  -Lab work today. Will let you know as soon as results are available.  -STOP taking phentermine as your blood pressure is very high today.  -Schedule follow up in 4 months for BP check.   Preventive Care 87-90 Years Old, Female Preventive care refers to visits with your health care provider and lifestyle choices that can promote health and wellness. This includes:  A yearly physical exam. This may also be called an annual well check.  Regular dental visits and eye exams.  Immunizations.  Screening for certain conditions.  Healthy lifestyle choices, such as eating a healthy diet, getting regular exercise, not using drugs or products that contain nicotine and tobacco, and limiting alcohol use. What can I expect for my preventive care visit? Physical exam Your health care provider will check your:  Height and weight. This may be used to calculate body mass index (BMI), which tells if you are at a healthy weight.  Heart rate and blood pressure.  Skin for abnormal spots. Counseling Your health care provider may ask you questions about your:  Alcohol, tobacco, and drug use.  Emotional well-being.  Home and relationship well-being.  Sexual activity.  Eating habits.  Work and work Statistician.  Method of birth control.  Menstrual cycle.  Pregnancy history. What immunizations do  I need?  Influenza (flu) vaccine  This is recommended every year. Tetanus, diphtheria, and pertussis (Tdap) vaccine  You may need a Td booster every 10 years. Varicella (chickenpox) vaccine  You may need this if you have not been vaccinated. Zoster (shingles) vaccine  You may need this after age 84. Measles, mumps, and rubella (MMR) vaccine  You may need at least one dose of MMR if you were  born in 1957 or later. You may also need a second dose. Pneumococcal conjugate (PCV13) vaccine  You may need this if you have certain conditions and were not previously vaccinated. Pneumococcal polysaccharide (PPSV23) vaccine  You may need one or two doses if you smoke cigarettes or if you have certain conditions. Meningococcal conjugate (MenACWY) vaccine  You may need this if you have certain conditions. Hepatitis A vaccine  You may need this if you have certain conditions or if you travel or work in places where you may be exposed to hepatitis A. Hepatitis B vaccine  You may need this if you have certain conditions or if you travel or work in places where you may be exposed to hepatitis B. Haemophilus influenzae type b (Hib) vaccine  You may need this if you have certain conditions. Human papillomavirus (HPV) vaccine  If recommended by your health care provider, you may need three doses over 6 months. You may receive vaccines as individual doses or as more than one vaccine together in one shot (combination vaccines). Talk with your health care provider about the risks and benefits of combination vaccines. What tests do I need? Blood tests  Lipid and cholesterol levels. These may be checked every 5 years, or more frequently if you are over 47 years old.  Hepatitis C test.  Hepatitis B test. Screening  Lung cancer screening. You may have this screening every year starting at age 68 if you have a 30-pack-year history of smoking and currently smoke or have quit within the past 15 years.  Colorectal cancer screening. All adults should have this screening starting at age 7 and continuing until age 66. Your health care provider may recommend screening at age 69 if you are at increased risk. You will have tests every 1-10 years, depending on your results and the type of screening test.  Diabetes screening. This is done by checking your blood sugar (glucose) after you have not eaten  for a while (fasting). You may have this done every 1-3 years.  Mammogram. This may be done every 1-2 years. Talk with your health care provider about when you should start having regular mammograms. This may depend on whether you have a family history of breast cancer.  BRCA-related cancer screening. This may be done if you have a family history of breast, ovarian, tubal, or peritoneal cancers.  Pelvic exam and Pap test. This may be done every 3 years starting at age 67. Starting at age 15, this may be done every 5 years if you have a Pap test in combination with an HPV test. Other tests  Sexually transmitted disease (STD) testing.  Bone density scan. This is done to screen for osteoporosis. You may have this scan if you are at high risk for osteoporosis. Follow these instructions at home: Eating and drinking  Eat a diet that includes fresh fruits and vegetables, whole grains, lean protein, and low-fat dairy.  Take vitamin and mineral supplements as recommended by your health care provider.  Do not drink alcohol if: ? Your health care provider tells  you not to drink. ? You are pregnant, may be pregnant, or are planning to become pregnant.  If you drink alcohol: ? Limit how much you have to 0-1 drink a day. ? Be aware of how much alcohol is in your drink. In the [place] one drink equals one 12 oz bottle of beer (355 mL), one 5 oz glass of wine (148 mL), or one 1 oz glass of hard liquor (44 mL). Lifestyle  Take daily care of your teeth and gums.  Stay active. Exercise for at least 30 minutes on 5 or more days each week.  Do not use any products that contain nicotine or tobacco, such as cigarettes, e-cigarettes, and chewing tobacco. If you need help quitting, ask your health care provider.  If you are sexually active, practice safe sex. Use a condom or other form of birth control (contraception) in order to prevent pregnancy and STIs (sexually transmitted infections).  If told by  your health care provider, take low-dose aspirin daily starting at age 26. What's next?  Visit your health care provider once a year for a well check visit.  Ask your health care provider how often you should have your eyes and teeth checked.  Stay up to date on all vaccines. This information is not intended to replace advice given to you by your health care provider. Make sure you discuss any questions you have with your health care provider. Document Released: 04/15/2015 Document Revised: 11/28/2017 Document Reviewed: 11/28/2017 Elsevier Patient Education  2020 [place], [place] [facility]

## 2018-10-24 ENCOUNTER — Other Ambulatory Visit: Payer: Self-pay | Admitting: Internal Medicine

## 2018-10-24 ENCOUNTER — Encounter: Payer: Self-pay | Admitting: Internal Medicine

## 2018-10-24 DIAGNOSIS — E559 Vitamin D deficiency, unspecified: Secondary | ICD-10-CM

## 2018-10-24 LAB — CYTOLOGY - PAP
Adequacy: ABSENT
Chlamydia: NEGATIVE
Diagnosis: NEGATIVE
HPV: NOT DETECTED
Neisseria Gonorrhea: NEGATIVE
Trichomonas: NEGATIVE

## 2018-10-24 LAB — CERVICOVAGINAL ANCILLARY ONLY
Bacterial vaginitis: NEGATIVE
Candida vaginitis: NEGATIVE

## 2018-10-24 MED ORDER — VITAMIN D (ERGOCALCIFEROL) 1.25 MG (50000 UNIT) PO CAPS: 50000.0000 [IU] | ORAL_CAPSULE | ORAL | 0 refills | Status: AC

## 2018-10-28 ENCOUNTER — Other Ambulatory Visit: Payer: Self-pay | Admitting: Internal Medicine

## 2018-10-28 DIAGNOSIS — B1710 Acute hepatitis C without hepatic coma: Secondary | ICD-10-CM

## 2018-10-28 LAB — RPR: RPR Ser Ql: NONREACTIVE

## 2018-10-28 LAB — HIV ANTIBODY (ROUTINE TESTING W REFLEX): HIV 1&2 Ab, 4th Generation: NONREACTIVE

## 2018-10-28 LAB — HEPATITIS PANEL, ACUTE
Hep A IgM: NONREACTIVE
Hep B C IgM: NONREACTIVE
Hepatitis B Surface Ag: NONREACTIVE
Hepatitis C Ab: REACTIVE — AB
SIGNAL TO CUT-OFF: 29 — ABNORMAL HIGH (ref <1.00)

## 2018-10-28 LAB — HEPATITIS C RNA QUANTITATIVE
HCV Quantitative Log: 5.94 Log IU/mL — ABNORMAL HIGH
HCV RNA, PCR, QN: 875000 IU/mL — ABNORMAL HIGH

## 2018-10-28 LAB — EXTRA LAV TOP TUBE

## 2018-10-29 ENCOUNTER — Other Ambulatory Visit: Payer: Self-pay | Admitting: Internal Medicine

## 2018-10-29 DIAGNOSIS — B1710 Acute hepatitis C without hepatic coma: Secondary | ICD-10-CM

## 2018-10-29 LAB — CERVICOVAGINAL ANCILLARY ONLY: Herpes: NEGATIVE

## 2018-11-10 ENCOUNTER — Ambulatory Visit: Payer: Self-pay

## 2018-11-10 ENCOUNTER — Ambulatory Visit (INDEPENDENT_AMBULATORY_CARE_PROVIDER_SITE_OTHER): Payer: BC Managed Care – PPO

## 2018-11-10 ENCOUNTER — Ambulatory Visit (HOSPITAL_COMMUNITY)
Admission: EM | Admit: 2018-11-10 | Discharge: 2018-11-10 | Disposition: A | Discharge status: Home / Self Care | Payer: BC Managed Care – PPO | Attending: Family Medicine | Admitting: Family Medicine

## 2018-11-10 ENCOUNTER — Encounter (HOSPITAL_COMMUNITY): Payer: Self-pay

## 2018-11-10 ENCOUNTER — Other Ambulatory Visit: Payer: Self-pay

## 2018-11-10 DIAGNOSIS — Z20828 Contact with and (suspected) exposure to other viral communicable diseases: Secondary | ICD-10-CM | POA: Diagnosis not present

## 2018-11-10 DIAGNOSIS — R0602 Shortness of breath: Secondary | ICD-10-CM | POA: Diagnosis not present

## 2018-11-10 DIAGNOSIS — J209 Acute bronchitis, unspecified: Secondary | ICD-10-CM

## 2018-11-10 DIAGNOSIS — R079 Chest pain, unspecified: Secondary | ICD-10-CM | POA: Diagnosis not present

## 2018-11-10 HISTORY — DX: Pneumonia, unspecified organism: J18.9

## 2018-11-10 MED ORDER — ALBUTEROL SULFATE HFA 108 (90 BASE) MCG/ACT IN AERS: 1.0000 | INHALATION_SPRAY | Freq: Four times a day (QID) | RESPIRATORY_TRACT | 1 refills | Status: DC | PRN

## 2018-11-10 MED ORDER — PREDNISONE 10 MG PO TABS: 40.0000 mg | ORAL_TABLET | Freq: Every day | ORAL | 0 refills | Status: AC

## 2018-11-10 NOTE — ED Triage Notes (Signed)
Pt presents with ongoing central chest pain that radiates through to her back and some shortness of breath.  Pt has Hx of Pneumonia and collapsed lung.

## 2018-11-10 NOTE — Telephone Encounter (Signed)
Incoming call from Patient with complaint of wheezing.  Denies fever.  States it feel like when she had pneumonia previously. .  Reports nausea.   No vomiting.  Reports chest heaviness and tightness Occasional  Heart  flutters.   Attempter to transfer call .  No answer X2.  Would like an appointment.

## 2018-11-10 NOTE — Telephone Encounter (Signed)
See note

## 2018-11-10 NOTE — ED Provider Notes (Signed)
[place]    CSN: [id number] Arrival date & time: 11/10/18  1322     History   Chief Complaint Chief Complaint  Patient presents with  . Chest Pain  . Shortness of Breath  . Back Pain    HPI Weston Fulco is a 38 y.o. female.   Patient is a 38 year old female with past medical history of hep C, drug addiction( in remission) , pneumothorax, pneumonia.  She is presenting today with approximately 2 days of central chest pressure radiating into her back with some mild shortness of breath.  Symptoms have been waxing and waning.  She has had mild cough that worsens at night especially when she lies down. Hears "rattling" in chest.  Reports the cough as dry.  Denies any associated fever, chills, body aches, sore throat or ear pain.  No known COVID exposures or sick contacts. Recently quit smoking. Hx of allergies.   ROS per HPI      Past Medical History:  Diagnosis Date  . Collapsed lung   . Drug addiction in remission ([place])    detox. of herion  . Hepatitis C   . Pneumonia     Patient Active Problem List   Diagnosis Date Noted  . Vitamin D deficiency 10/24/2018  . Elevated blood pressure reading with diagnosis of hypertension 10/23/2018  . Drug addiction in remission ([place]) 09/25/2018  . ADHD 09/25/2018  . Hepatitis C 05/16/2011    Past Surgical History:  Procedure Laterality Date  . ASPIRATION BIOPSY     in ICU for aspiration of vomit for 7 weeks  . LIVER BIOPSY  2009  . WISDOM TOOTH EXTRACTION      OB History    Gravida  5   Para  3   Term  2   Preterm  1   AB  2   Living  3     SAB      TAB      Ectopic      Multiple      Live Births  1            Home Medications    Prior to Admission medications   Medication Sig Start Date End Date Taking? Authorizing Provider  albuterol (VENTOLIN HFA) 108 (90 Base) MCG/ACT inhaler Inhale 1-2 puffs into the lungs every 6 (six) hours as needed for wheezing or shortness of breath.  11/10/18   Loura Halt A, NP  predniSONE (DELTASONE) 10 MG tablet Take 4 tablets (40 mg total) by mouth daily for 5 days. 11/10/18 11/15/18  Loura Halt A, NP  Vitamin D, Ergocalciferol, (DRISDOL) 1.25 MG (50000 UT) CAPS capsule Take 1 capsule (50,000 Units total) by mouth every 7 (seven) days for 12 doses. 10/24/18 01/10/19  Erline Hau, MD    Family History Family History  Adopted: Yes  Problem Relation Age of Onset  . Other Neg Hx     Social History Social History   Tobacco Use  . Smoking status: Current Some Day Smoker    Packs/day: 0.10    Years: 1.00    Pack years: 0.10    Types: Cigarettes    Last attempt to quit: 12/12/2005    Years since quitting: 12.9  . Smokeless tobacco: Never Used  . Tobacco comment: 2 cigarettes per week  Substance Use Topics  . 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 occasional alcohol use  .  Drug use: No     Allergies   Sulfa antibiotics   Review of Systems Review of Systems   Physical Exam Triage Vital Signs ED Triage Vitals  Enc Vitals Group     BP 11/10/18 1420 (!) 161/105     Pulse Rate 11/10/18 1420 71     Resp 11/10/18 1420 18     Temp 11/10/18 1420 98.4 F (36.9 C)     Temp Source 11/10/18 1420 Oral     SpO2 11/10/18 1420 96 %     Weight --      Height --      Head Circumference --      Peak Flow --      Pain Score 11/10/18 1421 7     Pain Loc --      Pain Edu? --      Excl. in GC? --    No data found.  Updated Vital Signs BP (!) 146/103 (BP Location: Left Arm)   Pulse 77   Temp 98.4 F (36.9 C) (Oral)   Resp 18   LMP 11/01/2018   SpO2 96%   Visual Acuity Right Eye Distance:   Left Eye Distance:   Bilateral Distance:    Right Eye Near:   Left Eye Near:    Bilateral Near:     Physical Exam Vitals signs and nursing note reviewed.  Constitutional:      General: She is not in acute distress.    Appearance: She is  well-developed. She is not ill-appearing, toxic-appearing or diaphoretic.  HENT:     Head: Normocephalic and atraumatic.  Neck:     Musculoskeletal: Normal range of motion.  Cardiovascular:     Rate and Rhythm: Normal rate and regular rhythm.  Pulmonary:     Effort: Pulmonary effort is normal. No respiratory distress.     Breath sounds: Examination of the right-upper field reveals wheezing. Examination of the left-upper field reveals wheezing. Examination of the right-middle field reveals wheezing. Examination of the right-lower field reveals wheezing. Decreased breath sounds and wheezing present.  Musculoskeletal: Normal range of motion.     Right lower leg: She exhibits no tenderness. No edema.     Left lower leg: She exhibits no tenderness. No edema.  Skin:    General: Skin is warm and dry.  Neurological:     Mental Status: She is alert.  Psychiatric:        Mood and Affect: Mood normal.      UC Treatments / Results  Labs (all labs ordered are listed, but only abnormal results are displayed) Labs Reviewed  NOVEL CORONAVIRUS, NAA (HOSPITAL ORDER, SEND-OUT TO REF LAB)    EKG   Radiology Dg Chest 2 View  Result Date: 11/10/2018 CLINICAL DATA:  Shortness of breath and chest pain EXAM: CHEST - 2 VIEW COMPARISON:  October 29, 2017 FINDINGS: No edema or consolidation. The heart size and pulmonary vascularity are normal. No adenopathy. No bone lesions. No pneumothorax. IMPRESSION: No edema or consolidation. Electronically Signed   By: Bretta BangWilliam  Woodruff III M.D.   On: 11/10/2018 14:47    Procedures Procedures (including critical care time)  Medications Ordered in UC Medications - No data to display  Initial Impression / Assessment and Plan / UC Course  I have reviewed the triage vital signs and the nursing notes.  Pertinent labs & imaging results that were available during my care of the patient were reviewed by me and considered in my medical decision making (see  chart for  details).     Patient is a 38 year old female with past medical history of hep C, drug addiction, pneumothorax and pneumonia.  She has been in remission for drug abuse for many years. She recently quit smoking.  She presents today with approximately 2 to 3 days of central chest discomfort radiating to her back with some shortness of breath.  She has had mild cough and wheezing. Lungs with diffuse expiratory wheezing throughout. No dyspnea or distress. Oxygen saturations at 96% and x-ray did not show any pneumonia, CHF or pneumothorax. Low concern for ACS or PE.  EKG revealed normal sinus rhythm and possible left atrial enlargement.   Most likely diagnosis is acute bronchitis.  Treating this with prednisone burst and albuterol inhaler to use as needed. Testing for COVID based on symptoms. Precautions given. Recommended that if symptoms continue or worsen throughout the end of the week that she will need to call back for possible prescription for antibiotics.  Patient understanding and agree. If she starts developing any severe chest pain or shortness of breath she will need to go to the ER.    Final Clinical Impressions(s) / UC Diagnoses   Final diagnoses:  Acute bronchitis, unspecified organism     Discharge Instructions     We are treating you for bronchitis.  Take the prednisone as prescribed daily for the next 5 days. Take this medication with food. Giving you an albuterol inhaler to use as needed for cough, wheezing, shortness of breath Tested you for COVID today.  We will call you with any positive results. Your EKG appeared normal If your symptoms continue or worsen despite this treatment please let us know.     ED Prescriptions    Medication Sig Dispense Auth. Provider   albuterol (VENTOLIN HFA) 108 (90 Base) MCG/ACT inhaler Inhale 1-2 puffs into the lungs every 6 (six) hours as needed for wheezing or shortness of breath. 18 g Larie Mathes A, NP   predniSONE  (DELTASONE) 10 MG tablet Take 4 tablets (40 mg total) by mouth daily for 5 days. 20 tablet Dahlia ByesBast, Nuvia Hileman A, NP     Controlled Substance Prescriptions [place] Controlled Substance Registry consulted? Not Applicable   Janace ArisBast, Tunis Gentle A, NP 11/10/18 1553

## 2018-11-10 NOTE — Telephone Encounter (Signed)
Patient advised to go to an urgent care or ED  d/t wheezing and RN can hear shortness of breath. Informed patient she will need a CXR to see if she does have pneumonia. Patient verbalized understanding.

## 2018-11-10 NOTE — Discharge Instructions (Addendum)
We are treating you for bronchitis.  Take the prednisone as prescribed daily for the next 5 days. Take this medication with food. Giving you an albuterol inhaler to use as needed for cough, wheezing, shortness of breath Tested you for COVID today.  We will call you with any positive results. Your EKG appeared normal If your symptoms continue or worsen despite this treatment please let us know.

## 2018-11-12 ENCOUNTER — Encounter: Payer: Self-pay | Admitting: Internal Medicine

## 2018-11-12 LAB — NOVEL CORONAVIRUS, NAA (HOSP ORDER, SEND-OUT TO REF LAB; TAT 18-24 HRS): SARS-CoV-2, NAA: NOT DETECTED

## 2018-11-13 ENCOUNTER — Telehealth (HOSPITAL_COMMUNITY): Payer: Self-pay | Admitting: Family Medicine

## 2018-11-13 MED ORDER — AZITHROMYCIN 250 MG PO TABS: 250.0000 mg | ORAL_TABLET | Freq: Every day | ORAL | 0 refills | Status: DC

## 2018-11-13 NOTE — Telephone Encounter (Signed)
Patient requesting antibiotic.  Not feeling better with prednisone and albuterol inhaler Sending in z pac

## 2018-11-14 ENCOUNTER — Encounter (HOSPITAL_COMMUNITY): Payer: Self-pay

## 2018-11-19 ENCOUNTER — Encounter: Payer: Self-pay | Admitting: Internal Medicine

## 2018-11-21 DIAGNOSIS — B182 Chronic viral hepatitis C: Secondary | ICD-10-CM | POA: Diagnosis not present

## 2018-11-24 ENCOUNTER — Other Ambulatory Visit: Payer: Self-pay

## 2018-11-24 ENCOUNTER — Encounter: Payer: Self-pay | Admitting: Family Medicine

## 2018-11-24 ENCOUNTER — Telehealth (INDEPENDENT_AMBULATORY_CARE_PROVIDER_SITE_OTHER): Payer: BC Managed Care – PPO | Admitting: Family Medicine

## 2018-11-24 DIAGNOSIS — L509 Urticaria, unspecified: Secondary | ICD-10-CM | POA: Diagnosis not present

## 2018-11-24 MED ORDER — METHYLPREDNISOLONE 4 MG PO TBPK: ORAL_TABLET | ORAL | 0 refills | Status: DC

## 2018-11-24 NOTE — Progress Notes (Signed)
Virtual Visit via Video Note  I connected with the patient on 11/24/18 at  1:45 PM EDT by a video enabled telemedicine application and verified that I am speaking with the correct person using two identifiers.  Location patient: home Location provider:work or home office Persons participating in the virtual visit: patient, provider  I discussed the limitations of evaluation and management by telemedicine and the availability of in person appointments. The patient expressed understanding and agreed to proceed.   HPI: Here for 2 days of an itchy rash on the neck, shoulders, and chest. No new lotions or soaps or detergents. She has had hives before and she thinks that is what this is. No other symptoms.    ROS: See pertinent positives and negatives per HPI.  Past Medical History:  Diagnosis Date  . Collapsed lung   . Drug addiction in remission ([place])    detox. of herion  . Hepatitis C   . Pneumonia     Past Surgical History:  Procedure Laterality Date  . ASPIRATION BIOPSY     in ICU for aspiration of vomit for 7 weeks  . LIVER BIOPSY  2009  . WISDOM TOOTH EXTRACTION      Family History  Adopted: Yes  Problem Relation Age of Onset  . Other Neg Hx      Current Outpatient Medications:  .  albuterol (VENTOLIN HFA) 108 (90 Base) MCG/ACT inhaler, Inhale 1-2 puffs into the lungs every 6 (six) hours as needed for wheezing or shortness of breath., Disp: 18 g, Rfl: 1 .  methylPREDNISolone (MEDROL DOSEPAK) 4 MG TBPK tablet, As directed, Disp: 21 tablet, Rfl: 0 .  Vitamin D, Ergocalciferol, (DRISDOL) 1.25 MG (50000 UT) CAPS capsule, Take 1 capsule (50,000 Units total) by mouth every 7 (seven) days for 12 doses., Disp: 12 capsule, Rfl: 0  Current Facility-Administered Medications:  .  PARAGARD INTRAUTERINE COPPER IUD 1 Units, 1 Units, Intrauterine, Once, Anyanwu, Ugonna A, MD  EXAM:  VITALS per patient if applicable:  GENERAL: alert, oriented, appears well and in no acute  distress  HEENT: atraumatic, conjunttiva clear, no obvious abnormalities on inspection of external nose and ears  NECK: normal movements of the head and neck  LUNGS: on inspection no signs of respiratory distress, breathing rate appears normal, no obvious gross SOB, gasping or wheezing  CV: no obvious cyanosis  MS: moves all visible extremities without noticeable abnormality  PSYCH/NEURO: pleasant and cooperative, no obvious depression or anxiety, speech and thought processing grossly intact  ASSESSMENT AND PLAN: Hives, treat with a Medrol dose pack. Add Benadryl prn. Avoid sunlight this week.  Alysia Penna, MD  Discussed the following assessment and plan:  No diagnosis found.     I discussed the assessment and treatment plan with the patient. The patient was provided an opportunity to ask questions and all were answered. The patient agreed with the plan and demonstrated an understanding of the instructions.   The patient was advised to call back or seek an in-person evaluation if the symptoms worsen or if the condition fails to improve as anticipated.

## 2018-11-27 ENCOUNTER — Other Ambulatory Visit: Payer: Self-pay | Admitting: Nurse Practitioner

## 2018-11-27 DIAGNOSIS — B182 Chronic viral hepatitis C: Secondary | ICD-10-CM

## 2018-12-01 DIAGNOSIS — F902 Attention-deficit hyperactivity disorder, combined type: Secondary | ICD-10-CM | POA: Diagnosis not present

## 2018-12-01 DIAGNOSIS — Z79899 Other long term (current) drug therapy: Secondary | ICD-10-CM | POA: Diagnosis not present

## 2018-12-01 DIAGNOSIS — F419 Anxiety disorder, unspecified: Secondary | ICD-10-CM | POA: Diagnosis not present

## 2018-12-02 ENCOUNTER — Ambulatory Visit: Payer: BC Managed Care – PPO | Admitting: Gastroenterology

## 2018-12-05 ENCOUNTER — Ambulatory Visit
Admission: RE | Admit: 2018-12-05 | Discharge: 2018-12-05 | Disposition: A | Discharge status: Home / Self Care | Payer: BC Managed Care – PPO | Source: Ambulatory Visit | Attending: Nurse Practitioner | Admitting: Nurse Practitioner

## 2018-12-05 DIAGNOSIS — B1920 Unspecified viral hepatitis C without hepatic coma: Secondary | ICD-10-CM | POA: Diagnosis not present

## 2018-12-05 DIAGNOSIS — B182 Chronic viral hepatitis C: Secondary | ICD-10-CM

## 2018-12-10 ENCOUNTER — Other Ambulatory Visit: Payer: Self-pay

## 2018-12-10 ENCOUNTER — Ambulatory Visit
Admission: RE | Admit: 2018-12-10 | Discharge: 2018-12-10 | Disposition: A | Discharge status: Home / Self Care | Payer: BC Managed Care – PPO | Source: Ambulatory Visit | Attending: Internal Medicine | Admitting: Internal Medicine

## 2018-12-10 DIAGNOSIS — Z1231 Encounter for screening mammogram for malignant neoplasm of breast: Secondary | ICD-10-CM | POA: Diagnosis not present

## 2018-12-10 DIAGNOSIS — Z1239 Encounter for other screening for malignant neoplasm of breast: Secondary | ICD-10-CM

## 2018-12-15 ENCOUNTER — Telehealth: Payer: Self-pay | Admitting: Internal Medicine

## 2018-12-15 NOTE — Telephone Encounter (Signed)
Patient called to check the status of some paperwork from [facility] that she needed filled out. She would like to know if they were faxed or does she need to come by the office to pick them up. Patient would like a call back pleas

## 2018-12-24 NOTE — Telephone Encounter (Signed)
Spoke with patient and she will re-fax the form that was not received.

## 2018-12-25 NOTE — Telephone Encounter (Signed)
Form completed, faxed, confirmed, and patient is aware. 

## 2019-01-16 DIAGNOSIS — K7400 Hepatic fibrosis, unspecified: Secondary | ICD-10-CM | POA: Diagnosis not present

## 2019-01-16 DIAGNOSIS — B182 Chronic viral hepatitis C: Secondary | ICD-10-CM | POA: Diagnosis not present

## 2019-01-23 ENCOUNTER — Telehealth: Payer: Self-pay | Admitting: Internal Medicine

## 2019-01-23 ENCOUNTER — Ambulatory Visit: Payer: BC Managed Care – PPO | Admitting: Internal Medicine

## 2019-01-23 NOTE — Telephone Encounter (Signed)
Called patient and relayed message to her.

## 2019-01-23 NOTE — Telephone Encounter (Signed)
No, I won't see her again. She had issues with multiple no shows.

## 2019-01-23 NOTE — Telephone Encounter (Signed)
Pt had scheduled an appt at [facility] for a hep b vaccine. I advised her to schedule with [facility] since she transferred in June. She is now requesting to transfer back to [facility]. Please advise on if this is okay. See messages below:         Message  How do I go about switching back to [facility]?  Appointment Scheduled  Krystal Zamora  Patient Appointment Schedule Request Pool 14 hours ago (5:39 PM)     How do I go about switching back to [facility]?        Mychart, Generic  [name], [name] 8 days ago     Appointment Information:     Visit Type: Office Visit         Date: 01/23/2019                 [facility]: [facility]                 Provider: [name]                 Time: 2:15 PM                 Length: 15 min   Appt Status: Canceled      Cancel Reason: Provider          [name], [name] 8 days ago     Good morning,  It looks like you transferred care to Dr. Isaac Bliss at the [place] office. They like for you to see your primary physician if possible, and it looks like she has availability on this day. I went ahead and cancelled this appointment with [name] so you can schedule at [facility].  Thanks, Zamora Romp, Generic  [name], [name] 9 days ago     Appointment Information:     Visit Type: Office Visit         Date: 01/23/2019                 De[facility]: [facility]                 Provider: [name]                 Time: 2:15 PM                 Length: 15 min   Appt Status: Scheduled    Appt Instructions:    Please arrive 15 minutes prior to your appointment. This will allow [place] to  verify and update your medical record and ensure a full appointment for you  within the time allotted.       This MyChart message has not been read.    Krystal Zamora  Patient Appointment Schedule Request Pool 9 days ago     Appointment For: Krystal Zamora ([id number]) Visit Type: OFFICE  VISIT ([id number])  01/23/2019   2:15 PM  15 mins.  Jearld Fenton, NP       [place]  Patient Comments: I need a Hepatitis B vaccine     This MyChart message has not been read.

## 2019-02-12 ENCOUNTER — Encounter: Payer: Self-pay | Admitting: Internal Medicine

## 2019-02-12 ENCOUNTER — Other Ambulatory Visit (HOSPITAL_COMMUNITY)
Admission: RE | Admit: 2019-02-12 | Discharge: 2019-02-12 | Disposition: A | Discharge status: Home / Self Care | Payer: BC Managed Care – PPO | Source: Ambulatory Visit | Attending: Internal Medicine | Admitting: Internal Medicine

## 2019-02-12 ENCOUNTER — Encounter: Payer: Self-pay | Admitting: Obstetrics and Gynecology

## 2019-02-12 ENCOUNTER — Ambulatory Visit: Payer: BC Managed Care – PPO | Admitting: Internal Medicine

## 2019-02-12 ENCOUNTER — Other Ambulatory Visit: Payer: Self-pay

## 2019-02-12 VITALS — BP 120/80 | HR 96 | Temp 97.6°F | Wt 244.4 lb

## 2019-02-12 DIAGNOSIS — B1710 Acute hepatitis C without hepatic coma: Secondary | ICD-10-CM

## 2019-02-12 DIAGNOSIS — Z309 Encounter for contraceptive management, unspecified: Secondary | ICD-10-CM

## 2019-02-12 DIAGNOSIS — Z113 Encounter for screening for infections with a predominantly sexual mode of transmission: Secondary | ICD-10-CM | POA: Diagnosis not present

## 2019-02-12 DIAGNOSIS — Z23 Encounter for immunization: Secondary | ICD-10-CM

## 2019-02-12 NOTE — Addendum Note (Signed)
Addended by: Westley Hummer B on: 02/12/2019 04:36 PM   Modules accepted: Orders

## 2019-02-12 NOTE — Patient Instructions (Signed)
-  Nice seeing you today!!  -Lab work today; will notify you once results are available.  -Twinrix today (Hep B and Hep A vaccine), will need another dose in 1 month and a final dose in 6 months.  -Will refer you to GYN to discuss your birth control method.  -Schedule follow up as needed.

## 2019-02-12 NOTE — Progress Notes (Signed)
Established Patient Office Visit     CC/Reason for Visit: discuss STD screening, hep b vaccine  HPI: Krystal Zamora is a 38 y.o. female who is coming in today for the above mentioned reasons.  At her last visit in July she had requested STD screening as she had just gotten back together with her husband after they have been separated for several months.  During that screening she was found to have a high hepatitis C viral load and was referred to hepatologist who has started treatment for active hepatitis C.  She was told by them to come here for hepatitis B vaccination as she was not immune.  Since then she has noticed vaginal itching, fishy odor, brownish/yellowish discharge.  She is concerned that she might have contracted another STD and is again requesting STD screening.   Past Medical/Surgical History: Past Medical History:  Diagnosis Date  . Collapsed lung   . 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 of herion  . Hepatitis C   . Pneumonia     Past Surgical History:  Procedure Laterality Date  . ASPIRATION BIOPSY     in ICU for aspiration of vomit for 7 weeks  . LIVER BIOPSY  2009  . WISDOM TOOTH EXTRACTION      Social History:  reports that she has been smoking cigarettes. She has a 0.10 pack-year smoking history. She has never used smokeless tobacco. She reports current alcohol use. She reports that she does not use drugs.  Allergies: Allergies  Allergen Reactions  . Sulfa Antibiotics Hives    Family History:  Family History  Adopted: Yes  Problem Relation Age of Onset  . Other Neg Hx      Current Outpatient Medications:  .  Lisdexamfetamine Dimesylate (VYVANSE) 30 MG CHEW, , Disp: , Rfl:  .  Sofosbuvir-Velpatasvir (EPCLUSA) 400-100 MG TABS, , Disp: , Rfl:   Current Facility-Administered Medications:  .  PARAGARD INTRAUTERINE COPPER IUD 1 Units, 1 Units, Intrauterine, Once, Anyanwu, Ugonna A, MD  Review of Systems:  Constitutional: Denies fever,  chills, diaphoresis, appetite change and fatigue.  HEENT: Denies photophobia, eye pain, redness, hearing loss, ear pain, congestion, sore throat, rhinorrhea, sneezing, mouth sores, trouble swallowing, neck pain, neck stiffness and tinnitus.   Respiratory: Denies SOB, DOE, cough, chest tightness,  and wheezing.   Cardiovascular: Denies chest pain, palpitations and leg swelling.  Gastrointestinal: Denies nausea, vomiting, abdominal pain, diarrhea, constipation, blood in stool and abdominal distention.  Genitourinary: Denies dysuria, urgency, frequency, hematuria, flank pain and difficulty urinating.  Endocrine: Denies: hot or cold intolerance, sweats, changes in hair or nails, polyuria, polydipsia. Musculoskeletal: Denies myalgias, back pain, joint swelling, arthralgias and gait problem.  Skin: Denies pallor, rash and wound.  Neurological: Denies dizziness, seizures, syncope, weakness, light-headedness, numbness and headaches.  Hematological: Denies adenopathy. Easy bruising, personal or family bleeding history  Psychiatric/Behavioral: Denies suicidal ideation, mood changes, confusion, nervousness, sleep disturbance and agitation    Physical Exam: Vitals:   02/12/19 0746  BP: 120/80  Pulse: 96  Temp: 97.6 F (36.4 C)  TempSrc: Temporal  SpO2: 96%  Weight: 244 lb 6.4 oz (110.9 kg)    Body mass index is 38.28 kg/m.   Constitutional: NAD, calm, comfortable Eyes: PERRL, lids and conjunctivae normal ENMT: Mucous membranes are moist.  Respiratory: clear to auscultation bilaterally, no wheezing, no crackles. Normal respiratory effort. No accessory muscle use.  Cardiovascular: Regular rate and rhythm, no murmurs / rubs / gallops. No extremity  edema. 2+ pedal pulses.  Abdomen: no tenderness, no masses palpated. No hepatosplenomegaly. Bowel sounds positive.  GU: moderate amount of yellowish discharge noted. Musculoskeletal: no clubbing / cyanosis. No joint deformity upper and lower  extremities. Good ROM, no contractures. Normal muscle tone.  Neurologic: Grossly intact and nonfocal Psychiatric: Normal judgment and insight. Alert and oriented x 3. Normal mood.    Impression and Plan:  Screening examination for STD (sexually transmitted disease) -Wet prep done in office today, will call patient back with results and possible treatment if necessary.  Acute hepatitis C virus infection without hepatic coma -Has started treatment with Epclusa.  Need for hepatitis B vaccination -Twinrix today, repeat doses in 1 month and 6 months.  Encounter for contraceptive management, unspecified type  -She has a ParaGard IUD that she would like to switch out.  Will refer to GYN.   Patient Instructions  -Nice seeing you today!!  -Lab work today; will notify you once results are available.  -Twinrix today (Hep B and Hep A vaccine), will need another dose in 1 month and a final dose in 6 months.  -Will refer you to GYN to discuss your birth control method.  -Schedule follow up as needed.     Lelon Frohlich, MD [facility]

## 2019-02-17 LAB — CERVICOVAGINAL ANCILLARY ONLY
Bacterial Vaginitis (gardnerella): NEGATIVE
Candida Glabrata: NEGATIVE
Candida Vaginitis: NEGATIVE
Chlamydia: NEGATIVE
Comment: NEGATIVE
Comment: NEGATIVE
Comment: NEGATIVE
Comment: NEGATIVE
Comment: NEGATIVE
Comment: NORMAL
Neisseria Gonorrhea: NEGATIVE
Trichomonas: NEGATIVE

## 2019-02-18 DIAGNOSIS — F338 Other recurrent depressive disorders: Secondary | ICD-10-CM | POA: Diagnosis not present

## 2019-02-18 DIAGNOSIS — Z79899 Other long term (current) drug therapy: Secondary | ICD-10-CM | POA: Diagnosis not present

## 2019-02-18 DIAGNOSIS — F419 Anxiety disorder, unspecified: Secondary | ICD-10-CM | POA: Diagnosis not present

## 2019-02-18 DIAGNOSIS — F902 Attention-deficit hyperactivity disorder, combined type: Secondary | ICD-10-CM | POA: Diagnosis not present

## 2019-02-24 ENCOUNTER — Ambulatory Visit: Payer: BC Managed Care – PPO | Admitting: Obstetrics and Gynecology

## 2019-03-16 ENCOUNTER — Other Ambulatory Visit: Payer: Self-pay

## 2019-03-16 ENCOUNTER — Ambulatory Visit (INDEPENDENT_AMBULATORY_CARE_PROVIDER_SITE_OTHER): Payer: BC Managed Care – PPO

## 2019-03-16 DIAGNOSIS — Z23 Encounter for immunization: Secondary | ICD-10-CM | POA: Diagnosis not present

## 2019-03-16 NOTE — Progress Notes (Signed)
Per orders of Dr. Jerilee Hoh, injection of Hep B  given by Franco Collet. Patient tolerated injection well.    Dr.Koberlien can you sign in the absence of Dr. Jerilee Hoh?

## 2019-06-17 ENCOUNTER — Emergency Department
Admission: EM | Admit: 2019-06-17 | Discharge: 2019-06-17 | Disposition: A | Discharge status: Home / Self Care | Payer: 59 | Attending: Emergency Medicine | Admitting: Emergency Medicine

## 2019-06-17 ENCOUNTER — Encounter: Payer: Self-pay | Admitting: Emergency Medicine

## 2019-06-17 ENCOUNTER — Other Ambulatory Visit: Payer: Self-pay

## 2019-06-17 ENCOUNTER — Emergency Department: Payer: 59

## 2019-06-17 DIAGNOSIS — R0789 Other chest pain: Secondary | ICD-10-CM | POA: Diagnosis present

## 2019-06-17 DIAGNOSIS — M25512 Pain in left shoulder: Secondary | ICD-10-CM | POA: Diagnosis not present

## 2019-06-17 DIAGNOSIS — F909 Attention-deficit hyperactivity disorder, unspecified type: Secondary | ICD-10-CM | POA: Insufficient documentation

## 2019-06-17 DIAGNOSIS — R079 Chest pain, unspecified: Secondary | ICD-10-CM

## 2019-06-17 DIAGNOSIS — Z20822 Contact with and (suspected) exposure to covid-19: Secondary | ICD-10-CM | POA: Diagnosis not present

## 2019-06-17 DIAGNOSIS — R42 Dizziness and giddiness: Secondary | ICD-10-CM | POA: Diagnosis not present

## 2019-06-17 DIAGNOSIS — F17210 Nicotine dependence, cigarettes, uncomplicated: Secondary | ICD-10-CM | POA: Diagnosis not present

## 2019-06-17 LAB — COMPREHENSIVE METABOLIC PANEL
ALT: 15 U/L (ref 0–44)
AST: 21 U/L (ref 15–41)
Albumin: 3.7 g/dL (ref 3.5–5.0)
Alkaline Phosphatase: 65 U/L (ref 38–126)
Anion gap: 8 (ref 5–15)
BUN: 12 mg/dL (ref 6–20)
CO2: 26 mmol/L (ref 22–32)
Calcium: 9.3 mg/dL (ref 8.9–10.3)
Chloride: 103 mmol/L (ref 98–111)
Creatinine, Ser: 0.8 mg/dL (ref 0.44–1.00)
GFR calc Af Amer: >60 mL/min (ref >60)
GFR calc non Af Amer: >60 mL/min (ref >60)
Glucose, Bld: 85 mg/dL (ref 70–99)
Potassium: 3.6 mmol/L (ref 3.5–5.1)
Sodium: 137 mmol/L (ref 135–145)
Total Bilirubin: 0.8 mg/dL (ref 0.3–1.2)
Total Protein: 7.2 g/dL (ref 6.5–8.1)

## 2019-06-17 LAB — RESPIRATORY PANEL BY RT PCR (FLU A&B, COVID)
Influenza A by PCR: NEGATIVE
Influenza B by PCR: NEGATIVE
SARS Coronavirus 2 by RT PCR: NEGATIVE

## 2019-06-17 LAB — CBC
HCT: 41.1 % (ref 36.0–46.0)
Hemoglobin: 14.5 g/dL (ref 12.0–15.0)
MCH: 31.7 pg (ref 26.0–34.0)
MCHC: 35.3 g/dL (ref 30.0–36.0)
MCV: 89.9 fL (ref 80.0–100.0)
Platelets: 248 10*3/uL (ref 150–400)
RBC: 4.57 MIL/uL (ref 3.87–5.11)
RDW: 12.3 % (ref 11.5–15.5)
WBC: 7.2 10*3/uL (ref 4.0–10.5)
nRBC: 0 % (ref 0.0–0.2)

## 2019-06-17 LAB — TROPONIN I (HIGH SENSITIVITY)
Troponin I (High Sensitivity): <2 ng/L (ref <18)
Troponin I (High Sensitivity): <2 ng/L (ref <18)

## 2019-06-17 NOTE — ED Notes (Signed)
This RN spoke with Inetta Fermo in lab who st trop and CMP have been received/collected but not resulted. Specimen are been processed at this time. This RN will call lab again.

## 2019-06-17 NOTE — ED Notes (Signed)
EDP Williams at bedside.  

## 2019-06-17 NOTE — ED Triage Notes (Signed)
Pt in via POV, reports generalized chest pain since 0400 w/ some light headedness.  Ambulatory to triage, NAD noted at this time.

## 2019-06-17 NOTE — ED Notes (Signed)
Triage entered per this RN.

## 2019-06-17 NOTE — ED Provider Notes (Signed)
[facility] Emergency Department Provider Note       Time seen: ----------------------------------------- 8:10 AM on 06/17/2019 -----------------------------------------   I have reviewed the triage vital signs and the nursing notes.  HISTORY   Chief Complaint Chest Pain    HPI Krystal Zamora is a 39 y.o. female with a history of hepatitis C, drug addiction, pneumothorax who presents to the ED for generalized chest pain since 4:00 this morning.  Patient had some lightheadedness as well.  She also had some left shoulder discomfort.  Discomfort was 5 out of 10.  Past Medical History:  Diagnosis Date  . Collapsed lung   . 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 of herion  . Hepatitis C   . Pneumonia     Patient Active Problem List   Diagnosis Date Noted  . Vitamin D deficiency 10/24/2018  . Elevated blood pressure reading with diagnosis of hypertension 10/23/2018  . Drug addiction in remission (HCC) 09/25/2018  . ADHD 09/25/2018  . Hepatitis C 05/16/2011    Past Surgical History:  Procedure Laterality Date  . ASPIRATION BIOPSY     in ICU for aspiration of vomit for 7 weeks  . LIVER BIOPSY  2009  . WISDOM TOOTH EXTRACTION      Allergies Sulfa antibiotics  Social History Social History   Tobacco Use  . Smoking status: Current Some Day Smoker    Packs/day: 0.50    Years: 1.00    Pack years: 0.50    Types: Cigarettes    Last attempt to quit: 12/12/2005    Years since quitting: 13.5  . Smokeless tobacco: Never Used  Substance Use Topics  . Alcohol use: Yes    Alcohol/week: 0.0 standard drinks  . Drug use: Not Currently    Comment: Heroin    Review of Systems Constitutional: Negative for fever. Cardiovascular: Positive for chest pain Respiratory: Negative for shortness of breath. Gastrointestinal: Negative for abdominal pain, vomiting and diarrhea. Musculoskeletal: Negative for back pain. Skin: Negative for  rash. Neurological: Negative for headaches, focal weakness or numbness.  All systems negative/normal/unremarkable except as stated in the HPI  ____________________________________________   PHYSICAL EXAM:  VITAL SIGNS: ED Triage Vitals  Enc Vitals Group     BP 06/17/19 0800 (!) 172/107     Pulse Rate 06/17/19 0800 76     Resp 06/17/19 0800 17     Temp 06/17/19 0800 97.9 F (36.6 C)     Temp Source 06/17/19 0800 Oral     SpO2 06/17/19 0800 100 %     Weight 06/17/19 0756 230 lb (104.3 kg)     Height 06/17/19 0756 5\' 7"  (1.702 m)     Head Circumference --      Peak Flow --      Pain Score 06/17/19 0756 5     Pain Loc --      Pain Edu? --      Excl. in GC? --     Constitutional: Alert and oriented. Well appearing and in no distress. Eyes: Conjunctivae are normal. Normal extraocular movements. Cardiovascular: Normal rate, regular rhythm. No murmurs, rubs, or gallops. Respiratory: Normal respiratory effort without tachypnea nor retractions. Breath sounds are clear and equal bilaterally. No wheezes/rales/rhonchi. Gastrointestinal: Soft and nontender. Normal bowel sounds Musculoskeletal: Nontender with normal range of motion in extremities. No lower extremity tenderness nor edema. Neurologic:  Normal speech and language. No gross focal neurologic deficits are appreciated.  Skin:  Skin is warm, dry and  intact. No rash noted. Psychiatric: Mood and affect are normal. Speech and behavior are normal.  ____________________________________________  EKG: Interpreted by me.  Sinus rhythm with rate of 80 bpm, normal PR interval, normal QRS, normal QT  ____________________________________________  ED COURSE:  As part of my medical decision making, I reviewed the following data within the [facility] History obtained from family if available, nursing notes, old chart and ekg, as well as notes from prior ED visits. Patient presented for chest pain, we will assess with labs  and imaging as indicated at this time.   Procedures  Krystal Zamora was evaluated in Emergency Department on 06/17/2019 for the symptoms described in the history of present illness. She was evaluated in the context of the global COVID-19 pandemic, which necessitated consideration that the patient might be at risk for infection with the SARS-CoV-2 virus that causes COVID-19. Institutional protocols and algorithms that pertain to the evaluation of patients at risk for COVID-19 are in a state of rapid change based on information released by regulatory bodies including the CDC and federal and state organizations. These policies and algorithms were followed during the patient's care in the ED.  ____________________________________________   LABS (pertinent positives/negatives)  Labs Reviewed  RESPIRATORY PANEL BY RT PCR (FLU A&B, COVID)  CBC  COMPREHENSIVE METABOLIC PANEL  TROPONIN I (HIGH SENSITIVITY)  TROPONIN I (HIGH SENSITIVITY)    RADIOLOGY Images were viewed by me  Chest x-ray IMPRESSION:  No active disease.  ____________________________________________   DIFFERENTIAL DIAGNOSIS   Musculoskeletal pain, GERD, anxiety, bronchitis, COVID-19  FINAL ASSESSMENT AND PLAN  Chest pain   Plan: The patient had presented for atypical chest pain. Patient's labs were unremarkable. Patient's imaging did not reveal any acute process.  Pain seems to be noncardiac in origin.  Should be referred to cardiology for outpatient follow-up.   Laurence Aly, MD    Note: This note was generated in part or whole with voice recognition software. Voice recognition is usually quite accurate but there are transcription errors that can and very often do occur. I apologize for any typographical errors that were not detected and corrected.     Earleen Newport, MD 06/17/19 1150

## 2019-06-17 NOTE — ED Notes (Addendum)
EDP Williams  at bedside to provide a status update to pt.

## 2019-11-12 ENCOUNTER — Other Ambulatory Visit
Admission: RE | Admit: 2019-11-12 | Discharge: 2019-11-12 | Disposition: A | Discharge status: Home / Self Care | Payer: 59 | Source: Ambulatory Visit | Attending: Pediatrics | Admitting: Pediatrics

## 2019-11-12 DIAGNOSIS — Z03818 Encounter for observation for suspected exposure to other biological agents ruled out: Secondary | ICD-10-CM | POA: Diagnosis present

## 2019-11-12 DIAGNOSIS — R0602 Shortness of breath: Secondary | ICD-10-CM | POA: Insufficient documentation

## 2019-11-12 LAB — FIBRIN DERIVATIVES D-DIMER (ARMC ONLY): Fibrin derivatives D-dimer (ARMC): 288.81 ng/mL (FEU) (ref 0.00–499.00)

## 2020-02-04 ENCOUNTER — Other Ambulatory Visit: Payer: Self-pay

## 2020-02-08 ENCOUNTER — Other Ambulatory Visit: Payer: Self-pay

## 2020-02-08 ENCOUNTER — Ambulatory Visit (INDEPENDENT_AMBULATORY_CARE_PROVIDER_SITE_OTHER): Payer: 59 | Admitting: Nurse Practitioner

## 2020-02-08 ENCOUNTER — Encounter: Payer: Self-pay | Admitting: Nurse Practitioner

## 2020-02-08 VITALS — BP 170/110 | HR 131 | Temp 98.2°F | Ht 67.0 in | Wt 237.0 lb

## 2020-02-08 DIAGNOSIS — I10 Essential (primary) hypertension: Secondary | ICD-10-CM

## 2020-02-08 DIAGNOSIS — F419 Anxiety disorder, unspecified: Secondary | ICD-10-CM

## 2020-02-08 DIAGNOSIS — F1921 Other psychoactive substance dependence, in remission: Secondary | ICD-10-CM | POA: Diagnosis not present

## 2020-02-08 DIAGNOSIS — R000 Tachycardia, unspecified: Secondary | ICD-10-CM | POA: Diagnosis not present

## 2020-02-08 DIAGNOSIS — R0683 Snoring: Secondary | ICD-10-CM | POA: Insufficient documentation

## 2020-02-08 DIAGNOSIS — F32A Depression, unspecified: Secondary | ICD-10-CM

## 2020-02-08 DIAGNOSIS — F909 Attention-deficit hyperactivity disorder, unspecified type: Secondary | ICD-10-CM

## 2020-02-08 MED ORDER — CARVEDILOL 3.125 MG PO TABS: 3.1250 mg | ORAL_TABLET | Freq: Two times a day (BID) | ORAL | 3 refills | Status: DC

## 2020-02-08 NOTE — Progress Notes (Addendum)
Established Patient Office Visit  Subjective:  Patient ID: Krystal Zamora, female    DOB: 09-06-1980  Age: 39 y.o. MRN: [id number]  CC:  Chief Complaint  Patient presents with  . Transitions Of Care    hypertension/elevated HR    HPI Krystal Zamora is a 39 yo who presents to establish care with our clinic [facility] as she is no longer working the [employer] area.  She has snoring and would like to have a sleep apnea test.  She is also noted an elevated heart rate and BP since July when she had Covid and would like to have that evaluated.  Krystal Zamora had Covid in July 2021 and had flu like chills, headache, nausea, low-grade fever, loss of taste and smell, mild cough and very little shortness of breath.  She stayed home in bed for a few days but recovered uneventfully.  She notes ever since then, her heart rate has been 110-130 BPM at rest and 2 weeks ago when she was on a 2 mile walk her heart rate was 180 BPM.  She had no chest pain or pressure, heaviness ,tightness.  She had a little shortness of breath but basically she felt okay.  Her face was red.  She was able to complete the walk without difficulty.  She takes no over-the-counter medicines.  She took phentermine of a couple years ago.    HTN: per chart review 2020.  Patient reports she has never been placed on medication.  She does not check her blood pressure at home.  Blood pressure today 190/118 with heart rate of 130 on arrival.  Blood pressure by me after rest 160/110 heart rate 110.  Patient is asymptomatic.  She denies taking any additional medication or stimulants. No CP/HA/dizziness/ SOB/DOE.  BP Readings from Last 3 Encounters:  02/08/20 (!) 170/110  06/17/19 (!) 172/107  02/12/19 120/80   Sleep disorder: Patient has very loud snoring.  She sorts no hard the other day that it irritated her uvula and.  She wakes up tired.  No morning headaches.  She does get headaches sometimes randomly throughout the  day.  ADHD/Anxiety/depression: Presents Vyvanse 60 mg 1 tablet daily as prescribed by [facility] attention specialist.  She has a history of anxiety depression, 2 months ago was referred to psychiatry.  She has tried antidepressants in the past but they does make her tired.  Currently, she is feeling a little tough with her mental health.  She separated from her husband in 2019, and they have now been together.  She feels stuck in her job and her life and does not know how to improve matters.  She is feeling more depressed now and pH Q9 was 11 and GAD: 9. Patient did comment that in the past year she has thought what was the use and being here.  She has no thoughts of self-harm or any intention of suicide because she adores her children.  Agreeable to referral to psychiatry again.    Drug addiction: Remission: History of IV heroin use discontinued in 2004.  No history of cocaine or other drug abuse.  She does drink a six pack of beer or a bottle of wine on the weekends.  She uses this for stress release. Previous heavier alcohol use. Tobacco 1/2 pack/day x 14 years on and off.  Probably a 20-year pack history per her estimate.  Liver disease: History of viral hepatitis C, treated.  Followed by [facility] liver specialist.  Reports she is due  for a liver FibroScan to look for fibrosis.  To her knowledge she does not have cirrhosis.  Records have been requested. She has no abdominal pain, jaundice, pruritis, ascites, edema.   Past Medical History:  Diagnosis Date  . Collapsed lung   . 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 of herion  . Hepatitis C   . Pneumonia     Past Surgical History:  Procedure Laterality Date  . ASPIRATION BIOPSY     in ICU for aspiration of vomit for 7 weeks  . LIVER BIOPSY  2009  . WISDOM TOOTH EXTRACTION      Family History  Adopted: Yes  Problem Relation Age of Onset  . Other Neg Hx     Social History   Socioeconomic History  . Marital status: Married     Spouse name: Not on file  . Number of children: Not on file  . Years of education: Not on file  . Highest education level: Not on file  Occupational History  . Not on file  Tobacco Use  . Smoking status: Current Some Day Smoker    Packs/day: 0.50    Years: 1.00    Pack years: 0.50    Types: Cigarettes    Last attempt to quit: 12/12/2005    Years since quitting: 14.1  . Smokeless tobacco: Never Used  Vaping Use  . Vaping Use: Never used  Substance and Sexual Activity  . Alcohol use: Yes    Alcohol/week: 0.0 standard drinks  . Drug use: Not Currently    Comment: Heroin  . Sexual activity: Yes    Partners: Male    Birth control/protection: I.U.D.  Other Topics Concern  . Not on file  Social History Narrative  . Not on file   Social Determinants of Health   Financial Resource Strain:   . Difficulty of Paying Living Expenses: Not on file  Food Insecurity:   . Worried About Programme researcher, broadcasting/film/video in the Last Year: Not on file  . Ran Out of Food in the Last Year: Not on file  Transportation Needs:   . Lack of Transportation (Medical): Not on file  . Lack of Transportation (Non-Medical): Not on file  Physical Activity:   . Days of Exercise per Week: Not on file  . Minutes of Exercise per Session: Not on file  Stress:   . Feeling of Stress : Not on file  Social Connections:   . Frequency of Communication with Friends and Family: Not on file  . Frequency of Social Gatherings with Friends and Family: Not on file  . Attends Religious Services: Not on file  . Active Member of Clubs or Organizations: Not on file  . Attends Banker Meetings: Not on file  . Marital Status: Not on file  Intimate Partner Violence:   . Fear of Current or Ex-Partner: Not on file  . Emotionally Abused: Not on file  . Physically Abused: Not on file  . Sexually Abused: Not on file    Outpatient Medications Prior to Visit  Medication Sig Dispense Refill  . VYVANSE 60 MG capsule Take  60 mg by mouth every morning.    . Amphetamine ER (ADZENYS XR-ODT) 18.8 MG TBED Take 18.8 mg by mouth 2 (two) times daily. (Patient not taking: Reported on 02/08/2020)     Facility-Administered Medications Prior to Visit  Medication Dose Route Frequency Provider Last Rate Last Admin  . PARAGARD INTRAUTERINE COPPER IUD 1 Units  1  Units Intrauterine Once Anyanwu, Jethro BastosUgonna A, MD        Allergies  Allergen Reactions  . Sulfa Antibiotics Hives  . Sulfasalazine Hives    Review of Systems Pertinent positives noted in history of present illness and otherwise negative.   Objective:    Physical Exam Vitals reviewed.  Constitutional:      General: She is not in acute distress.    Appearance: She is obese.  HENT:     Head: Normocephalic and atraumatic.  Eyes:     Conjunctiva/sclera: Conjunctivae normal.     Pupils: Pupils are equal, round, and reactive to light.  Cardiovascular:     Rate and Rhythm: Regular rhythm. Tachycardia present.     Pulses: Normal pulses.     Heart sounds: Normal heart sounds. No murmur heard.   Pulmonary:     Effort: Pulmonary effort is normal.     Breath sounds: Normal breath sounds. No wheezing or rales.  Abdominal:     Palpations: Abdomen is soft.     Tenderness: There is no abdominal tenderness.  Musculoskeletal:        General: Normal range of motion.     Cervical back: Normal range of motion and neck supple.     Right lower leg: No edema.     Left lower leg: No edema.  Skin:    General: Skin is warm and dry.  Neurological:     General: No focal deficit present.     Mental Status: She is alert and oriented to person, place, and time. Mental status is at baseline.  Psychiatric:        Mood and Affect: Mood normal.        Speech: Speech normal.        Behavior: Behavior normal. Behavior is cooperative.        Thought Content: Thought content normal.        Cognition and Memory: Cognition normal.        Judgment: Judgment normal.     BP (!)  170/110 Comment: left  Pulse (!) 131   Temp 98.2 F (36.8 C) (Oral)   Ht 5\' 7"  (1.702 m)   Wt 237 lb (107.5 kg)   SpO2 99%   BMI 37.12 kg/m  Wt Readings from Last 3 Encounters:  02/08/20 237 lb (107.5 kg)  06/17/19 230 lb (104.3 kg)  02/12/19 244 lb 6.4 oz (110.9 kg)   Pulse Readings from Last 3 Encounters:  02/08/20 (!) 131  06/17/19 76  02/12/19 96    BP Readings from Last 3 Encounters:  02/08/20 (!) 170/110  06/17/19 (!) 172/107  02/12/19 120/80    Lab Results  Component Value Date   CHOL 161 10/23/2018   HDL 57.70 10/23/2018   LDLCALC 79 10/23/2018   LDLDIRECT 84.0 05/14/2017   TRIG 122.0 10/23/2018   CHOLHDL 3 10/23/2018      There are no preventive care reminders to display for this patient.  There are no preventive care reminders to display for this patient.  Lab Results  Component Value Date   TSH 1.39 10/23/2018   Lab Results  Component Value Date   WBC 7.2 06/17/2019   HGB 14.5 06/17/2019   HCT 41.1 06/17/2019   MCV 89.9 06/17/2019   PLT 248 06/17/2019   Lab Results  Component Value Date   NA 137 06/17/2019   K 3.6 06/17/2019   CO2 26 06/17/2019   GLUCOSE 85 06/17/2019   BUN 12 06/17/2019  CREATININE 0.80 06/17/2019   BILITOT 0.8 06/17/2019   ALKPHOS 65 06/17/2019   AST 21 06/17/2019   ALT 15 06/17/2019   PROT 7.2 06/17/2019   ALBUMIN 3.7 06/17/2019   CALCIUM 9.3 06/17/2019   ANIONGAP 8 06/17/2019   GFR 75.01 10/23/2018   Lab Results  Component Value Date   CHOL 161 10/23/2018   Lab Results  Component Value Date   HDL 57.70 10/23/2018   Lab Results  Component Value Date   LDLCALC 79 10/23/2018   Lab Results  Component Value Date   TRIG 122.0 10/23/2018   Lab Results  Component Value Date   CHOLHDL 3 10/23/2018   Lab Results  Component Value Date   HGBA1C 5.2 10/23/2018      Assessment & Plan:   Problem List Items Addressed This Visit      Cardiovascular and Mediastinum   Hypertension - Primary   Relevant  Medications   carvedilol (COREG) 3.125 MG tablet   Other Relevant Orders   Ambulatory referral to Cardiology     Other   Anxiety and depression   Drug addiction in remission [facility])   Attention deficit hyperactivity disorder (ADHD)   Tachycardia   Relevant Orders   Ambulatory referral to Cardiology   Snoring      Meds ordered this encounter  Medications  . carvedilol (COREG) 3.125 MG tablet    Sig: Take 1 tablet (3.125 mg total) by mouth 2 (two) times daily with a meal.    Dispense:  60 tablet    Refill:  3    Order Specific Question:   Supervising Provider    Answer:   Dale Bolt [[id number]]    Begin Coreg 1 pill twice a day for heart rate and BP- will lower. Please get an arm BP cuff and measure your readings at home .  Referral to Cardiology. - urgent - will call you for an appt.   Referral to Psychiatry: Please make an appointment with the psychiatry and here are 3 places to call.    [facility] :  [phone]  [facility]:  [phone]  [facility]: [phone]  Let me know what medications you have tried for depression.  If you get severe depression or feeling of self harm - go to [facility] Emergency room as there are mental health providers available 24 hrs /7 day a week.    I will place referral  in for sleep apnea- snoring testing at next visit. To the lab today- ran out of time  Will return next week Mon at 0900 for OV- check BP on Coreg and get fasting labs.     Follow-up: No follow-ups on file.  This visit occurred during the SARS-CoV-2 public health emergency.  Safety protocols were in place, including screening questions prior to the visit, additional usage of staff PPE, and extensive cleaning of exam room while observing appropriate contact time as indicated for disinfecting solutions.    Amedeo Kinsman, NP

## 2020-02-08 NOTE — Patient Instructions (Addendum)
To the lab today.   Begin Coreg 1 pill twice a day for heart rate and BP- will lower. Please get an arm BP cuff and measure your readings at home .  Referral to Cardiology. - urgent - will call you for an appt.   Referral to Psychiatry: Please make an appointment with the psychiatry and here are 3 places to call.    [facility] :  [phone]  [facility]:  [phone]  [facility]: [phone]  Let me know what medications you have tried for depression.  If you get severe depression or feeling of self harm - go to [facility] Emergency room as there are mental health providers available 24 hrs /7 day a week.   Office visit with me in 1 week for re-check. I will place referral  in for sleep apnea- snoring testing at next visit.     Hypertension, Adult High blood pressure (hypertension) is when the force of blood pumping through the arteries is too strong. The arteries are the blood vessels that carry blood from the heart throughout the body. Hypertension forces the heart to work harder to pump blood and may cause arteries to become narrow or stiff. Untreated or uncontrolled hypertension can cause a heart attack, heart failure, a stroke, kidney disease, and other problems. A blood pressure reading consists of a higher number over a lower number. Ideally, your blood pressure should be below 120/80. The first ("top") number is called the systolic pressure. It is a measure of the pressure in your arteries as your heart beats. The second ("bottom") number is called the diastolic pressure. It is a measure of the pressure in your arteries as the heart relaxes. What are the causes? The exact cause of this condition is not known. There are some conditions that result in or are related to high blood pressure. What increases the risk? Some risk factors for high blood pressure are under your control. The following factors may make you more likely to develop this condition:  Smoking.  Having type 2 diabetes  mellitus, high cholesterol, or both.  Not getting enough exercise or physical activity.  Being overweight.  Having too much fat, sugar, calories, or salt (sodium) in your diet.  Drinking too much alcohol. Some risk factors for high blood pressure may be difficult or impossible to change. Some of these factors include:  Having chronic kidney disease.  Having a family history of high blood pressure.  Age. Risk increases with age.  Race. You may be at higher risk if you are African American.  Gender. Men are at higher risk than women before age 86. After age 49, women are at higher risk than men.  Having obstructive sleep apnea.  Stress. What are the signs or symptoms? High blood pressure may not cause symptoms. Very high blood pressure (hypertensive crisis) may cause:  Headache.  Anxiety.  Shortness of breath.  Nosebleed.  Nausea and vomiting.  Vision changes.  Severe chest pain.  Seizures. How is this diagnosed? This condition is diagnosed by measuring your blood pressure while you are seated, with your arm resting on a flat surface, your legs uncrossed, and your feet flat on the floor. The cuff of the blood pressure monitor will be placed directly against the skin of your upper arm at the level of your heart. It should be measured at least twice using the same arm. Certain conditions can cause a difference in blood pressure between your right and left arms. Certain factors can cause blood pressure  readings to be lower or higher than normal for a short period of time:  When your blood pressure is higher when you are in a health care provider's office than when you are at home, this is called white coat hypertension. Most people with this condition do not need medicines.  When your blood pressure is higher at home than when you are in a health care provider's office, this is called masked hypertension. Most people with this condition may need medicines to control blood  pressure. If you have a high blood pressure reading during one visit or you have normal blood pressure with other risk factors, you may be asked to:  Return on a different day to have your blood pressure checked again.  Monitor your blood pressure at home for 1 week or longer. If you are diagnosed with hypertension, you may have other blood or imaging tests to help your health care provider understand your overall risk for other conditions. How is this treated? This condition is treated by making healthy lifestyle changes, such as eating healthy foods, exercising more, and reducing your alcohol intake. Your health care provider may prescribe medicine if lifestyle changes are not enough to get your blood pressure under control, and if:  Your systolic blood pressure is above 130.  Your diastolic blood pressure is above 80. Your personal target blood pressure may vary depending on your medical conditions, your age, and other factors. Follow these instructions at home: Eating and drinking   Eat a diet that is high in fiber and potassium, and low in sodium, added sugar, and fat. An example eating plan is called the DASH (Dietary Approaches to Stop Hypertension) diet. To eat this way: ? Eat plenty of fresh fruits and vegetables. Try to fill one half of your plate at each meal with fruits and vegetables. ? Eat whole grains, such as whole-wheat pasta, brown rice, or whole-grain bread. Fill about one fourth of your plate with whole grains. ? Eat or drink low-fat dairy products, such as skim milk or low-fat yogurt. ? Avoid fatty cuts of meat, processed or cured meats, and poultry with skin. Fill about one fourth of your plate with lean proteins, such as fish, chicken without skin, beans, eggs, or tofu. ? Avoid pre-made and processed foods. These tend to be higher in sodium, added sugar, and fat.  Reduce your daily sodium intake. Most people with hypertension should eat less than 1,500 mg of sodium a  day.  Do not drink alcohol if: ? Your health care provider tells you not to drink. ? You are pregnant, may be pregnant, or are planning to become pregnant.  If you drink alcohol: ? Limit how much you use to:  0-1 drink a day for women.  0-2 drinks a day for men. ? Be aware of how much alcohol is in your drink. In the [place] one drink equals one 12 oz bottle of beer (355 mL), one 5 oz glass of wine (148 mL), or one 1 oz glass of hard liquor (44 mL). Lifestyle   Work with your health care provider to maintain a healthy body weight or to lose weight. Ask what an ideal weight is for you.  Get at least 30 minutes of exercise most days of the week. Activities may include walking, swimming, or biking.  Include exercise to strengthen your muscles (resistance exercise), such as Pilates or lifting weights, as part of your weekly exercise routine. Try to do these types of exercises for 30  minutes at least 3 days a week.  Do not use any products that contain nicotine or tobacco, such as cigarettes, e-cigarettes, and chewing tobacco. If you need help quitting, ask your health care provider.  Monitor your blood pressure at home as told by your health care provider.  Keep all follow-up visits as told by your health care provider. This is important. Medicines  Take over-the-counter and prescription medicines only as told by your health care provider. Follow directions carefully. Blood pressure medicines must be taken as prescribed.  Do not skip doses of blood pressure medicine. Doing this puts you at risk for problems and can make the medicine less effective.  Ask your health care provider about side effects or reactions to medicines that you should watch for. Contact a health care provider if you:  Think you are having a reaction to a medicine you are taking.  Have headaches that keep coming back (recurring).  Feel dizzy.  Have swelling in your ankles.  Have trouble with your  vision. Get help right away if you:  Develop a severe headache or confusion.  Have unusual weakness or numbness.  Feel faint.  Have severe pain in your chest or abdomen.  Vomit repeatedly.  Have trouble breathing. Summary  Hypertension is when the force of blood pumping through your arteries is too strong. If this condition is not controlled, it may put you at risk for serious complications.  Your personal target blood pressure may vary depending on your medical conditions, your age, and other factors. For most people, a normal blood pressure is less than 120/80.  Hypertension is treated with lifestyle changes, medicines, or a combination of both. Lifestyle changes include losing weight, eating a healthy, low-sodium diet, exercising more, and limiting alcohol. This information is not intended to replace advice given to you by your health care provider. Make sure you discuss any questions you have with your health care provider. Document Revised: 11/27/2017 Document Reviewed: 11/27/2017 Elsevier Patient Education  2020 Elsevier Inc.  Managing Anxiety, Adult After being diagnosed with an anxiety disorder, you may be relieved to know why you have felt or behaved a certain way. You may also feel overwhelmed about the treatment ahead and what it will mean for your life. With care and support, you can manage this condition and recover from it. How to manage lifestyle changes Managing stress and anxiety  Stress is your body's reaction to life changes and events, both good and bad. Most stress will last just a few hours, but stress can be ongoing and can lead to more than just stress. Although stress can play a major role in anxiety, it is not the same as anxiety. Stress is usually caused by something external, such as a deadline, test, or competition. Stress normally passes after the triggering event has ended.  Anxiety is caused by something internal, such as imagining a terrible outcome or  worrying that something will go wrong that will devastate you. Anxiety often does not go away even after the triggering event is over, and it can become long-term (chronic) worry. It is important to understand the differences between stress and anxiety and to manage your stress effectively so that it does not lead to an anxious response. Talk with your health care provider or a counselor to learn more about reducing anxiety and stress. He or she may suggest tension reduction techniques, such as:  Music therapy. This can include creating or listening to music that you enjoy and that inspires  you.  Mindfulness-based meditation. This involves being aware of your normal breaths while not trying to control your breathing. It can be done while sitting or walking.  Centering prayer. This involves focusing on a word, phrase, or sacred image that means something to you and brings you peace.  Deep breathing. To do this, expand your stomach and inhale slowly through your nose. Hold your breath for 3-5 seconds. Then exhale slowly, letting your stomach muscles relax.  Self-talk. This involves identifying thought patterns that lead to anxiety reactions and changing those patterns.  Muscle relaxation. This involves tensing muscles and then relaxing them. Choose a tension reduction technique that suits your lifestyle and personality. These techniques take time and practice. Set aside 5-15 minutes a day to do them. Therapists can offer counseling and training in these techniques. The training to help with anxiety may be covered by some insurance plans. Other things you can do to manage stress and anxiety include:  Keeping a stress/anxiety diary. This can help you learn what triggers your reaction and then learn ways to manage your response.  Thinking about how you react to certain situations. You may not be able to control everything, but you can control your response.  Making time for activities that help you  relax and not feeling guilty about spending your time in this way.  Visual imagery and yoga can help you stay calm and relax.  Medicines Medicines can help ease symptoms. Medicines for anxiety include:  Anti-anxiety drugs.  Antidepressants. Medicines are often used as a primary treatment for anxiety disorder. Medicines will be prescribed by a health care provider. When used together, medicines, psychotherapy, and tension reduction techniques may be the most effective treatment. Relationships Relationships can play a big part in helping you recover. Try to spend more time connecting with trusted friends and family members. Consider going to couples counseling, taking family education classes, or going to family therapy. Therapy can help you and others better understand your condition. How to recognize changes in your anxiety Everyone responds differently to treatment for anxiety. Recovery from anxiety happens when symptoms decrease and stop interfering with your daily activities at home or work. This may mean that you will start to:  Have better concentration and focus. Worry will interfere less in your daily thinking.  Sleep better.  Be less irritable.  Have more energy.  Have improved memory. It is important to recognize when your condition is getting worse. Contact your health care provider if your symptoms interfere with home or work and you feel like your condition is not improving. Follow these instructions at home: Activity  Exercise. Most adults should do the following: ? Exercise for at least 150 minutes each week. The exercise should increase your heart rate and make you sweat (moderate-intensity exercise). ? Strengthening exercises at least twice a week.  Get the right amount and quality of sleep. Most adults need 7-9 hours of sleep each night. Lifestyle   Eat a healthy diet that includes plenty of vegetables, fruits, whole grains, low-fat dairy products, and lean  protein. Do not eat a lot of foods that are high in solid fats, added sugars, or salt.  Make choices that simplify your life.  Do not use any products that contain nicotine or tobacco, such as cigarettes, e-cigarettes, and chewing tobacco. If you need help quitting, ask your health care provider.  Avoid caffeine, alcohol, and certain over-the-counter cold medicines. These may make you feel worse. Ask your pharmacist which medicines to avoid.  General instructions  Take over-the-counter and prescription medicines only as told by your health care provider.  Keep all follow-up visits as told by your health care provider. This is important. Where to find support You can get help and support from these sources:  Self-help groups.  Online and [employer].  A trusted spiritual leader.  Couples counseling.  Family education classes.  Family therapy. Where to find more information You may find that joining a support group helps you deal with your anxiety. The following sources can help you locate counselors or support groups near you:  Mental Health [place]: www.mentalhealthamerica.net  Anxiety and Depression Association of [place] (ADAA): ProgramCam.de  [employer] on Mental Illness (NAMI): www.nami.org Contact a health care provider if you:  Have a hard time staying focused or finishing daily tasks.  Spend many hours a day feeling worried about everyday life.  Become exhausted by worry.  Start to have headaches, feel tense, or have nausea.  Urinate more than normal.  Have diarrhea. Get help right away if you have:  A racing heart and shortness of breath.  Thoughts of hurting yourself or others. If you ever feel like you may hurt yourself or others, or have thoughts about taking your own life, get help right away. You can go to your nearest emergency department or call:  Your local emergency services (911 in the [place]  A suicide crisis helpline, such  as the National Suicide Prevention Lifeline at 630-077-9295. This is open 24 hours a day. Summary  Taking steps to learn and use tension reduction techniques can help calm you and help prevent triggering an anxiety reaction.  When used together, medicines, psychotherapy, and tension reduction techniques may be the most effective treatment.  Family, friends, and partners can play a big part in helping you recover from an anxiety disorder. This information is not intended to replace advice given to you by your health care provider. Make sure you discuss any questions you have with your health care provider. Document Revised: 08/19/2018 Document Reviewed: 08/19/2018 Elsevier Patient Education  2020 Elsevier Inc.  Major Depressive Disorder, Adult Major depressive disorder (MDD) is a mental health condition. It may also be called clinical depression or unipolar depression. MDD usually causes feelings of sadness, hopelessness, or helplessness. MDD can also cause physical symptoms. It can interfere with work, school, relationships, and other everyday activities. MDD may be mild, moderate, or severe. It may occur once (single episode major depressive disorder) or it may occur multiple times (recurrent major depressive disorder). What are the causes? The exact cause of this condition is not known. MDD is most likely caused by a combination of things, which may include:  Genetic factors. These are traits that are passed along from parent to child.  Individual factors. Your personality, your behavior, and the way you handle your thoughts and feelings may contribute to MDD. This includes personality traits and behaviors learned from others.  Physical factors, such as: ? Differences in the part of your brain that controls emotion. This part of your brain may be different than it is in people who do not have MDD. ? Long-term (chronic) medical or psychiatric illnesses.  Social factors. Traumatic  experiences or major life changes may play a role in the development of MDD. What increases the risk? This condition is more likely to develop in women. The following factors may also make you more likely to develop MDD:  A family history of depression.  Troubled family relationships.  Abnormally low  levels of certain brain chemicals.  Traumatic events in childhood, especially abuse or the loss of a parent.  Being under a lot of stress, or long-term stress, especially from upsetting life experiences or losses.  A history of: ? Chronic physical illness. ? Other mental health disorders. ? Substance abuse.  Poor living conditions.  Experiencing social exclusion or discrimination on a regular basis. What are the signs or symptoms? The main symptoms of MDD typically include:  Constant depressed or irritable mood.  Loss of interest in things and activities. MDD symptoms may also include:  Sleeping or eating too much or too little.  Unexplained weight change.  Fatigue or low energy.  Feelings of worthlessness or guilt.  Difficulty thinking clearly or making decisions.  Thoughts of suicide or of harming others.  Physical agitation or weakness.  Isolation. Severe cases of MDD may also occur with other symptoms, such as:  Delusions or hallucinations, in which you imagine things that are not real (psychotic depression).  Low-level depression that lasts at least a year (chronic depression or persistent depressive disorder).  Extreme sadness and hopelessness (melancholic depression).  Trouble speaking and moving (catatonic depression). How is this diagnosed? This condition may be diagnosed based on:  Your symptoms.  Your medical history, including your mental health history. This may involve tests to evaluate your mental health. You may be asked questions about your lifestyle, including any drug and alcohol use, and how long you have had symptoms of MDD.  A physical  exam.  Blood tests to rule out other conditions. You must have a depressed mood and at least four other MDD symptoms most of the day, nearly every day in the same 2-week timeframe before your health care provider can confirm a diagnosis of MDD. How is this treated? This condition is usually treated by mental health professionals, such as psychologists, psychiatrists, and clinical social workers. You may need more than one type of treatment. Treatment may include:  Psychotherapy. This is also called talk therapy or counseling. Types of psychotherapy include: ? Cognitive behavioral therapy (CBT). This type of therapy teaches you to recognize unhealthy feelings, thoughts, and behaviors, and replace them with positive thoughts and actions. ? Interpersonal therapy (IPT). This helps you to improve the way you relate to and communicate with others. ? Family therapy. This treatment includes members of your family.  Medicine to treat anxiety and depression, or to help you control certain emotions and behaviors.  Lifestyle changes, such as: ? Limiting alcohol and drug use. ? Exercising regularly. ? Getting plenty of sleep. ? Making healthy eating choices. ? Spending more time outdoors.  Treatments involving stimulation of the brain can be used in situations with extremely severe symptoms, or when medicine or other therapies do not work over time. These treatments include electroconvulsive therapy, transcranial magnetic stimulation, and vagal nerve stimulation. Follow these instructions at home: Activity  Return to your normal activities as told by your health care provider.  Exercise regularly and spend time outdoors as told by your health care provider. General instructions  Take over-the-counter and prescription medicines only as told by your health care provider.  Do not drink alcohol. If you drink alcohol, limit your alcohol intake to no more than 1 drink a day for nonpregnant women and 2  drinks a day for men. One drink equals 12 oz of beer, 5 oz of wine, or 1 oz of hard liquor. Alcohol can affect any antidepressant medicines you are taking. Talk to your health care  provider about your alcohol use.  Eat a healthy diet and get plenty of sleep.  Find activities that you enjoy doing, and make time to do them.  Consider joining a support group. Your health care provider may be able to recommend a support group.  Keep all follow-up visits as told by your health care provider. This is important. Where to find more information [employer] on Mental Illness  www.nami.org [place] General  of Mental Health  http://www.maynard.net/ National Suicide Prevention Lifeline  1-800-273-TALK 321-597-6400). This is free, 24-hour help. Contact a health care provider if:  Your symptoms get worse.  You develop new symptoms. Get help right away if:  You self-harm.  You have serious thoughts about hurting yourself or others.  You see, hear, taste, smell, or feel things that are not present (hallucinate). This information is not intended to replace advice given to you by your health care provider. Make sure you discuss any questions you have with your health care provider. Document Revised: 03/01/2017 Document Reviewed: 09/28/2015 Elsevier Patient Education  2020 [employer].

## 2020-02-09 ENCOUNTER — Encounter: Payer: Self-pay | Admitting: Nurse Practitioner

## 2020-02-12 ENCOUNTER — Ambulatory Visit: Payer: 59 | Admitting: Internal Medicine

## 2020-02-12 NOTE — Progress Notes (Deleted)
New Outpatient Visit Date: 02/12/2020  Referring Provider: Theadore Nan, NP [address] [place],  [place] [address]  Chief Complaint: ***  HPI:  [name] is a 39 y.o. female who is being seen today for the evaluation of hypertension at the request of Ms. Arvilla Market. She has a history of hepatitis C, pneumothorax, drug addiction in remission, and COVID-19 infection (10/2019).  She was seen for an initial visit by Ms. Mills 4 days ago and was noted to be hypertensive and tachycardic.  --------------------------------------------------------------------------------------------------  Cardiovascular History & Procedures: Cardiovascular Problems:  Hypertension  Risk Factors:  Hypertension and obesity  Cath/PCI:  None  CV Surgery:  None  EP Procedures and Devices:  None  Non-Invasive Evaluation(s):  None  Recent CV Pertinent Labs: Lab Results  Component Value Date   CHOL 161 10/23/2018   HDL 57.70 10/23/2018   LDLCALC 79 10/23/2018   LDLDIRECT 84.0 05/14/2017   TRIG 122.0 10/23/2018   CHOLHDL 3 10/23/2018   INR 0.96 11/29/2011   K 3.6 06/17/2019   BUN 12 06/17/2019   CREATININE 0.80 06/17/2019   CREATININE 0.59 11/29/2011    --------------------------------------------------------------------------------------------------  Past Medical History:  Diagnosis Date  . Collapsed lung   . 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 of herion  . Hepatitis C   . Pneumonia     Past Surgical History:  Procedure Laterality Date  . ASPIRATION BIOPSY     in ICU for aspiration of vomit for 7 weeks  . LIVER BIOPSY  2009  . WISDOM TOOTH EXTRACTION      No outpatient medications have been marked as taking for the 02/12/20 encounter (Appointment) with Saskia Simerson, Cristal Deer, MD.   Current Facility-Administered Medications for the 02/12/20 encounter (Appointment) with Hope Holst, Cristal Deer, MD  Medication  . PARAGARD INTRAUTERINE COPPER IUD 1 Units     Allergies: Sulfa antibiotics and Sulfasalazine  Social History   Tobacco Use  . Smoking status: Current Some Day Smoker    Packs/day: 0.50    Years: 1.00    Pack years: 0.50    Types: Cigarettes    Last attempt to quit: 12/12/2005    Years since quitting: 14.1  . Smokeless tobacco: Never Used  Vaping Use  . Vaping Use: Never used  Substance Use Topics  . Alcohol use: Yes    Alcohol/week: 0.0 standard drinks  . Drug use: Not Currently    Comment: Heroin    Family History  Adopted: Yes  Problem Relation Age of Onset  . Other Neg Hx     Review of Systems: A 12-system review of systems was performed and was negative except as noted in the HPI.  --------------------------------------------------------------------------------------------------  Physical Exam: There were no vitals taken for this visit.  General:  *** HEENT: No conjunctival pallor or scleral icterus. Facemask in place. Neck: Supple without lymphadenopathy, thyromegaly, JVD, or HJR. No carotid bruit. Lungs: Normal work of breathing. Clear to auscultation bilaterally without wheezes or crackles. Heart: Regular rate and rhythm without murmurs, rubs, or gallops. Non-displaced PMI. Abd: Bowel sounds present. Soft, NT/ND without hepatosplenomegaly Ext: No lower extremity edema. Radial, PT, and DP pulses are 2+ bilaterally Skin: Warm and dry without rash. Neuro: CNIII-XII intact. Strength and fine-touch sensation intact in upper and lower extremities bilaterally. Psych: Normal mood and affect.  EKG:  ***  Lab Results  Component Value Date   WBC 7.2 06/17/2019   HGB 14.5 06/17/2019   HCT 41.1 06/17/2019   MCV 89.9  06/17/2019   PLT 248 06/17/2019    Lab Results  Component Value Date   NA 137 06/17/2019   K 3.6 06/17/2019   CL 103 06/17/2019   CO2 26 06/17/2019   BUN 12 06/17/2019   CREATININE 0.80 06/17/2019   GLUCOSE 85 06/17/2019   ALT 15 06/17/2019    Lab Results  Component Value Date    CHOL 161 10/23/2018   HDL 57.70 10/23/2018   LDLCALC 79 10/23/2018   LDLDIRECT 84.0 05/14/2017   TRIG 122.0 10/23/2018   CHOLHDL 3 10/23/2018     --------------------------------------------------------------------------------------------------  ASSESSMENT AND PLAN: Cristal Deer Nayana Lenig, MD 02/12/2020 6:16 AM

## 2020-02-15 ENCOUNTER — Encounter: Payer: 59 | Admitting: Nurse Practitioner

## 2020-02-15 ENCOUNTER — Telehealth: Payer: Self-pay | Admitting: Nurse Practitioner

## 2020-02-15 ENCOUNTER — Encounter: Payer: Self-pay | Admitting: Nurse Practitioner

## 2020-02-15 ENCOUNTER — Other Ambulatory Visit: Payer: Self-pay

## 2020-02-15 NOTE — Telephone Encounter (Addendum)
I LMOM for her to call me back to start telephone visit. She does not have video capabilities and developed Cold Sx on Fri.   I called again and LMOM to call the office to reschedule the telephone  visit.

## 2020-02-15 NOTE — Progress Notes (Signed)
Erroneous

## 2020-02-17 NOTE — Telephone Encounter (Signed)
LMTCB

## 2020-02-19 NOTE — Telephone Encounter (Signed)
LMTCB

## 2020-02-22 NOTE — Telephone Encounter (Signed)
LMTCB

## 2020-02-23 NOTE — Progress Notes (Signed)
New Outpatient Visit Date: 02/24/2020  Referring Provider: Theadore Nan, NP [address] [place],  [place] [address]  Chief Complaint: Elevated blood pressure and heart rate  HPI:  Krystal Zamora is a 39 y.o. female who is being seen today for the evaluation of hypertension at the request of Ms. Arvilla Market. She has a history of hepatitis C, pneumothorax, drug addiction in remission, and COVID-19 infection (10/2019).  She was seen for an initial visit by Ms. Mills on 02/08/2020 and was noted to be hypertensive and tachycardic.  EKG was not performed.  Ms. Gumbs reports a several year history of hypertension, though her blood pressure and heart rate seemed to go up significantly after she contracted COVID-19 in July, 2021.  She notes occasional flutters and skipped beats but is largely unaware of heart rates that have been up to 180 bpm with modest activity. She was recently prescribed carvedilol by Ms. Arvilla Market but has yet to start this medication. She has been monitoring her blood pressure at home, noting readings of 152-197/102-112.  Ms. Niccoli reports intermittent chest pain for several years, which has led her to seek evaluation in the emergency department (most recently 06/2019 (. ED work-ups thus far have been unrevealing. She was told that her symptoms may be related to nerve/spine issues or panic attacks. She continues to note occasional chest discomfort that she describes as pressure that usually accompanies bronchitis. She has occasional ankle edema, worse on the right. She also wakes up at times with headaches, though these are not a daily occurrence. She has not had any lightheadedness/dizziness, syncope, orthopnea, or PND. She is in the process of trying to wean herself off Vyvanse, as she is concerned that this may be contributing to her hypertension and tachycardia. She is currently only using it a few days a  week.  --------------------------------------------------------------------------------------------------  Cardiovascular History & Procedures: Cardiovascular Problems:  Hypertension  Tachycardia  Risk Factors:  Hypertension and obesity  Cath/PCI:  None  CV Surgery:  None  EP Procedures and Devices:  None  Non-Invasive Evaluation(s):  None  Recent CV Pertinent Labs: Lab Results  Component Value Date   CHOL 161 10/23/2018   HDL 57.70 10/23/2018   LDLCALC 79 10/23/2018   LDLDIRECT 84.0 05/14/2017   TRIG 122.0 10/23/2018   CHOLHDL 3 10/23/2018   INR 0.96 11/29/2011   K 3.6 06/17/2019   BUN 12 06/17/2019   CREATININE 0.80 06/17/2019   CREATININE 0.59 11/29/2011    --------------------------------------------------------------------------------------------------  Past Medical History:  Diagnosis Date  . Collapsed lung   . 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 of herion  . Hepatitis C   . Pneumonia     Past Surgical History:  Procedure Laterality Date  . ASPIRATION BIOPSY     in ICU for aspiration of vomit for 7 weeks  . LIVER BIOPSY  2009  . WISDOM TOOTH EXTRACTION      Current Meds  Medication Sig  . lisdexamfetamine (VYVANSE) 40 MG capsule Take 40 mg by mouth every morning.    Current Facility-Administered Medications for the 02/24/20 encounter (Office Visit) with Lerae Langham, Cristal Deer, MD  Medication  . PARAGARD INTRAUTERINE COPPER IUD 1 Units    Allergies: Sulfa antibiotics and Sulfasalazine  Social History   Tobacco Use  . Smoking status: Current Every Day Smoker    Packs/day: 0.50    Years: 1.00    Pack years: 0.50    Types: Cigarettes    Last attempt to quit:  12/12/2005    Years since quitting: 14.2  . Smokeless tobacco: Never Used  Vaping Use  . Vaping Use: Never used  Substance Use Topics  . Alcohol use: Yes    Alcohol/week: 0.0 standard drinks    Comment: 6 pack every weekend  . Drug use: Not Currently    Comment:  Heroin    Family History  Adopted: Yes  Problem Relation Age of Onset  . Diabetes Mother   . Other Neg Hx     Review of Systems: A 12-system review of systems was performed and was negative except as noted in the HPI.  --------------------------------------------------------------------------------------------------  Physical Exam: BP (!) 168/120 (BP Location: Right Arm, Patient Position: Sitting, Cuff Size: Large)   Pulse (!) 117   Ht 5\' 7"  (1.702 m)   Wt 233 lb 2 oz (105.7 kg)   SpO2 97%   BMI 36.51 kg/m   General: NAD. HEENT: No conjunctival pallor or scleral icterus. Facemask in place. Neck: Supple without lymphadenopathy, thyromegaly, JVD, or HJR. No carotid bruit. Lungs: Normal work of breathing. Clear to auscultation bilaterally without wheezes or crackles. Heart: Tachycardic but regular without murmurs, rubs, or gallops. Abd: Bowel sounds present. Soft, NT/ND without hepatosplenomegaly Ext: No lower extremity edema. Radial, PT, and DP pulses are 2+ bilaterally Skin: Warm and dry without rash. Neuro: CNIII-XII intact. Strength and fine-touch sensation intact in upper and lower extremities bilaterally. Psych: Normal mood and affect.  EKG: Sinus tachycardia with possible left atrial enlargement and poor R wave progression could reflect septal infarct versus lead placement.  Lab Results  Component Value Date   WBC 7.2 06/17/2019   HGB 14.5 06/17/2019   HCT 41.1 06/17/2019   MCV 89.9 06/17/2019   PLT 248 06/17/2019    Lab Results  Component Value Date   NA 137 06/17/2019   K 3.6 06/17/2019   CL 103 06/17/2019   CO2 26 06/17/2019   BUN 12 06/17/2019   CREATININE 0.80 06/17/2019   GLUCOSE 85 06/17/2019   ALT 15 06/17/2019    Lab Results  Component Value Date   CHOL 161 10/23/2018   HDL 57.70 10/23/2018   LDLCALC 79 10/23/2018   LDLDIRECT 84.0 05/14/2017   TRIG 122.0 10/23/2018   CHOLHDL 3 10/23/2018      --------------------------------------------------------------------------------------------------  ASSESSMENT AND PLAN: Sinus tachycardia: This is most likely compensatory for some other systemic process. Notably, with markedly elevated blood pressure, Ms. Sundberg is at risk for cardiomyopathy that could manifest as tachycardia (though she does not have other signs or symptoms of acute heart failure at this time). We will attempt to perform an echocardiogram in the office today. I will also check a CBC, CMP, TSH, and BNP today. I think it would be reasonable for Ms. Capitano to begin taking the carvedilol 3.125 mg twice daily that was previously prescribed. I have encouraged Ms. Woodson to wean herself off of Vyvanse and to avoid other stimulants such as caffeine.  Uncontrolled hypertension: Blood pressure is severely elevated in the office today, albeit without symptoms. Clonidine 0.1 mg has been administered x2 with minimal blood pressure lowering effect over the course of 1 hr with frequent reassessments. I recommended transport to the emergency department for further evaluation and treatment of hypertensive urgency, which Ms. Verge has declined. In lieu of this, we will initiate amlodipine 5 mg daily in addition to the carvedilol 3.125 mg twice daily that was previously prescribed but not started. I will check a CBC, CMP, serum aldosterone/plasma renin  activity, 24-hour urine collection for fractionated catecholamines and metanephrines, and urine pregnancy test. We will also arrange for a transthoracic echocardiogram and renal artery Doppler. I advised Ms. Dern to seek immediate medical attention if her blood pressure remains consistently above 180/110 or she develops any symptoms.  Follow-up: Return to clinic in 2 weeks.  Yvonne Kendall, MD 02/24/2020 2:25 PM

## 2020-02-24 ENCOUNTER — Other Ambulatory Visit: Payer: Self-pay

## 2020-02-24 ENCOUNTER — Ambulatory Visit (INDEPENDENT_AMBULATORY_CARE_PROVIDER_SITE_OTHER): Payer: 59 | Admitting: Internal Medicine

## 2020-02-24 ENCOUNTER — Other Ambulatory Visit (INDEPENDENT_AMBULATORY_CARE_PROVIDER_SITE_OTHER): Payer: 59

## 2020-02-24 ENCOUNTER — Other Ambulatory Visit
Admission: RE | Admit: 2020-02-24 | Discharge: 2020-02-24 | Disposition: A | Discharge status: Home / Self Care | Payer: 59 | Source: Ambulatory Visit | Attending: Internal Medicine | Admitting: Internal Medicine

## 2020-02-24 ENCOUNTER — Encounter: Payer: Self-pay | Admitting: Internal Medicine

## 2020-02-24 VITALS — BP 168/120 | HR 117 | Ht 67.0 in | Wt 233.1 lb

## 2020-02-24 DIAGNOSIS — I10 Essential (primary) hypertension: Secondary | ICD-10-CM | POA: Insufficient documentation

## 2020-02-24 DIAGNOSIS — R000 Tachycardia, unspecified: Secondary | ICD-10-CM | POA: Diagnosis present

## 2020-02-24 LAB — COMPREHENSIVE METABOLIC PANEL
ALT: 16 U/L (ref 0–44)
AST: 21 U/L (ref 15–41)
Albumin: 4.2 g/dL (ref 3.5–5.0)
Alkaline Phosphatase: 77 U/L (ref 38–126)
Anion gap: 10 (ref 5–15)
BUN: 14 mg/dL (ref 6–20)
CO2: 25 mmol/L (ref 22–32)
Calcium: 10 mg/dL (ref 8.9–10.3)
Chloride: 100 mmol/L (ref 98–111)
Creatinine, Ser: 0.73 mg/dL (ref 0.44–1.00)
GFR, Estimated: >60 mL/min (ref >60)
Glucose, Bld: 109 mg/dL — ABNORMAL HIGH (ref 70–99)
Potassium: 3.5 mmol/L (ref 3.5–5.1)
Sodium: 135 mmol/L (ref 135–145)
Total Bilirubin: 0.8 mg/dL (ref 0.3–1.2)
Total Protein: 8.2 g/dL — ABNORMAL HIGH (ref 6.5–8.1)

## 2020-02-24 LAB — CBC
HCT: 44 % (ref 36.0–46.0)
Hemoglobin: 15.6 g/dL — ABNORMAL HIGH (ref 12.0–15.0)
MCH: 31.9 pg (ref 26.0–34.0)
MCHC: 35.5 g/dL (ref 30.0–36.0)
MCV: 90 fL (ref 80.0–100.0)
Platelets: 341 10*3/uL (ref 150–400)
RBC: 4.89 MIL/uL (ref 3.87–5.11)
RDW: 11.9 % (ref 11.5–15.5)
WBC: 11.5 10*3/uL — ABNORMAL HIGH (ref 4.0–10.5)
nRBC: 0 % (ref 0.0–0.2)

## 2020-02-24 LAB — TSH: TSH: 1.364 u[IU]/mL (ref 0.350–4.500)

## 2020-02-24 LAB — PREGNANCY, URINE: Preg Test, Ur: NEGATIVE

## 2020-02-24 LAB — BRAIN NATRIURETIC PEPTIDE: B Natriuretic Peptide: 22.4 pg/mL (ref 0.0–100.0)

## 2020-02-24 MED ORDER — AMLODIPINE BESYLATE 5 MG PO TABS: 5.0000 mg | ORAL_TABLET | Freq: Every day | ORAL | 5 refills | Status: DC

## 2020-02-24 MED ORDER — CARVEDILOL 3.125 MG PO TABS
3.1250 mg | ORAL_TABLET | Freq: Two times a day (BID) | ORAL | 3 refills | Status: DC
Start: 2020-02-24 — End: 2020-03-15

## 2020-02-24 MED ORDER — CLONIDINE HCL 0.1 MG PO TABS
0.1000 mg | ORAL_TABLET | Freq: Once | ORAL | Status: AC
  Administered 2020-02-24: 0.1 mg via ORAL

## 2020-02-24 NOTE — Patient Instructions (Signed)
Medication Instructions:  Your physician has recommended you make the following change in your medication:   1. START taking Amlodipine (Norvasc) 5 MG: take 1 tablet by mouth daily. 2. RESTART taking Carvedilol (Coreg) 3.125 MG: 1 tablet by mouth daily.  *If you need a refill on your cardiac medications before your next appointment, please call your pharmacy*   Lab Work: CBC, CMP, TSH, BNP,  Renin, Aldosterone, Urine Pregnancy, UA, 24hr urine collection    Testing/Procedures:  Your physician has requested that you have a renal artery duplex. During this test, an ultrasound is used to evaluate blood flow to the kidneys. Allow one hour for this exam. Do not eat after midnight the day before and avoid carbonated beverages. Take your medications as you usually do.    Follow-Up: At [facility], you and your health needs are our priority.  As part of our continuing mission to provide you with exceptional heart care, we have created designated Provider Care Teams.  These Care Teams include your primary Cardiologist (physician) and Advanced Practice Providers (APPs -  Physician Assistants and Nurse Practitioners) who all work together to provide you with the care you need, when you need it.  We recommend signing up for the patient portal called "MyChart".  Sign up information is provided on this After Visit Summary.  MyChart is used to connect with patients for Virtual Visits (Telemedicine).  Patients are able to view lab/test results, encounter notes, upcoming appointments, etc.  Non-urgent messages can be sent to your provider as well.   To learn more about what you can do with MyChart, go to [email or web address].    Your next appointment:   2 week(s)  The format for your next appointment:   In Person  Provider:   You may see Dr. Okey Dupre or one of the following Advanced Practice Providers on your designated Care Team:    Nicolasa Ducking, NP  Eula Listen, PA-C  Marisue Ivan,  PA-C  Cadence Clyde, [place]  Gillian Shields, NP    Other Instructions

## 2020-02-26 ENCOUNTER — Encounter: Payer: Self-pay | Admitting: Internal Medicine

## 2020-02-26 LAB — ECHOCARDIOGRAM COMPLETE
Area-P 1/2: 5.02 cm2
Calc EF: 61.9 %
Height: 67 in
S' Lateral: 2.9 cm
Single Plane A2C EF: 57 %
Single Plane A4C EF: 65.6 %
Weight: 3730 oz

## 2020-03-09 ENCOUNTER — Encounter (HOSPITAL_COMMUNITY): Payer: 59

## 2020-03-11 ENCOUNTER — Ambulatory Visit: Payer: 59 | Admitting: Family

## 2020-03-14 NOTE — Progress Notes (Signed)
Office Visit    Patient Name: Krystal Zamora Date of Encounter: 03/14/2020  Primary Care Provider:  Theadore Nan, NP Primary Cardiologist:  Yvonne Kendall, MD Electrophysiologist:  None   Chief Complaint    Krystal Zamora is a 39 y.o. female with a hx of sinus tachycardia, hypertension, hepatitis C, pneumothorax, drug addiction in remission, COVID-19 infection (10/2019) presents today for follow-up of hypertension  Past Medical History    Past Medical History:  Diagnosis Date  . Collapsed lung 2004  . 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 of herion  . Hepatitis C   . Hypertension   . Pneumonia    Past Surgical History:  Procedure Laterality Date  . ASPIRATION BIOPSY     in ICU for aspiration of vomit for 7 weeks  . LIVER BIOPSY  2009  . WISDOM TOOTH EXTRACTION      Allergies  Allergies  Allergen Reactions  . Sulfa Antibiotics Hives  . Sulfasalazine Hives    History of Present Illness    Krystal Zamora is a 39 y.o. female with a hx of sinus tachycardia, hypertension, hepatitis C, pneumothorax, drug addiction in remission, COVID-19 infection (10/2019) last seen 02/24/2020 by Dr. Okey Dupre.  She was seen in consult due to tachycardia and hypertension.  Noted several year history of hypertension though blood pressure and heart rate seem to increase significantly after she had COVID-19 October 2019.  She was prescribed carvedilol by her primary care provider but had not yet started at time of clinic visit.  She was in the process of trying to wean herself off of Vyvanse as she was worried it was contributory to hypertension and tachycardia.  She was recommended to start amlodipine 5 mg daily, resume Coreg 3.125 mg twice daily as well as renal artery duplex.  Lab work collected showed K3.5, creatinine 0.73, GFR greater than 60, BNP 22.4 hemoglobin 15.6, WBC 11.5, TSH 1.364.  She was also recommended for aldosterone renin ratio and urine metanephrines/catecholamines.  She  presents today for follow-up.  Tells me she is tolerating lidocaine and carvedilol well.  No chest pain, Shikhman tightness.  Reports no shortness of breath at rest no dyspnea on exertion.  She has not yet completed her 24-hour urine collection and her renal duplex is upcoming.  Had to be rescheduled-family was exposed to Covid but thankfully none of them got sick.  Her home blood pressure is running 150/110 on her arm cuff.  We discussed bringing her arm cuff to her next clinic visit we could assess the accuracy.  She does note that she sometimes misses her morning dose of carvedilol as she tries to wait until she gets to take it, we discussed that she could take without eating.  EKGs/Labs/Other Studies Reviewed:   The following studies were reviewed today:  EKG: No EKG today.  Recent Labs: 02/24/2020: ALT 16; B Natriuretic Peptide 22.4; BUN 14; Creatinine, Ser 0.73; Hemoglobin 15.6; Platelets 341; Potassium 3.5; Sodium 135; TSH 1.364  Recent Lipid Panel    Component Value Date/Time   CHOL 161 10/23/2018 1014   TRIG 122.0 10/23/2018 1014   HDL 57.70 10/23/2018 1014   CHOLHDL 3 10/23/2018 1014   VLDL 24.4 10/23/2018 1014   LDLCALC 79 10/23/2018 1014   LDLDIRECT 84.0 05/14/2017 1556    Home Medications   No outpatient medications have been marked as taking for the 03/15/20 encounter (Appointment) with Alver Sorrow, NP.   Current Facility-Administered Medications for the 03/15/20 encounter (Appointment)  with Alver Sorrow, NP  Medication  . PARAGARD INTRAUTERINE COPPER IUD 1 Units     Review of Systems  All other systems reviewed and are otherwise negative except as noted above.  Physical Exam    VS:  There were no vitals taken for this visit. , BMI There is no height or weight on file to calculate BMI.  Wt Readings from Last 3 Encounters:  02/24/20 233 lb 2 oz (105.7 kg)  02/15/20 237 lb (107.5 kg)  02/08/20 237 lb (107.5 kg)    GEN: Well nourished, overweight, well  developed, in no acute distress. HEENT: normal. Neck: Supple, no JVD, carotid bruits, or masses. Cardiac: RRR, no murmurs, rubs, or gallops. No clubbing, cyanosis, edema.  Radials/DP/PT 2+ and equal bilaterally.  Respiratory:  Respirations regular and unlabored, clear to auscultation bilaterally. GI: Soft, nontender, nondistended. MS: No deformity or atrophy. Skin: Warm and dry, no rash. Neuro:  Strength and sensation are intact. Psych: Normal affect.  Assessment & Plan    1. Uncontrolled hypertension- Improved since last clinic visit.  Upcoming renal duplex 04/08/19.  She has 24-hour urine collection container as previously ordered and tells me she will collect.  Lab work 02/24/2020 included normal kidney, renal function, TSH, BNP 22.4, hemoglobin 15.6, WBC 11.5.  BP in clinic 136/90 on manual check.  Her home cuff is reading 150/110, she will bring to next clinic visit to assess accuracy.  Continue amlodipine 5 mg daily.  Increase carvedilol to 6.25 mg twice daily.  2. Sinus tachycardia-Echo 02/24/2020 LVEF 55 to 60%, grade 1 diastolic dysfunction, LV global longitudinal strain 11.8%, RV normal size and function, no significant valvular abnormalities.  Heart rate improved since last clinic visit and addition of Coreg.  We are increasing Coreg, as above.  Reports no palpitations.  3. COVID 19 vaccination questions-she has not yet been vaccinated as COVID-19.  She and her family had Covid over the summer and she is interested in getting vaccine.  Long discussion regarding benefits of vaccine and encouraged good vaccinated.  Discussed the immune response as her body build antibodies may make her feel fatigued so recommend she get the vaccine on a day that she should be off and rest the next day if needed.  Discussed that her antibodies from her infection this summer are likely not effective at preventing recurrent infection at this point.  Disposition: Follow up in 2 month(s) with Dr. Okey Dupre or  APP  Signed, Alver Sorrow, NP 03/14/2020, 1:49 PM  [facility]

## 2020-03-15 ENCOUNTER — Ambulatory Visit (INDEPENDENT_AMBULATORY_CARE_PROVIDER_SITE_OTHER): Payer: 59 | Admitting: Family

## 2020-03-15 ENCOUNTER — Ambulatory Visit: Payer: 59 | Admitting: Family

## 2020-03-15 ENCOUNTER — Other Ambulatory Visit: Payer: Self-pay

## 2020-03-15 ENCOUNTER — Encounter: Payer: Self-pay | Admitting: Family

## 2020-03-15 VITALS — BP 136/98 | HR 99 | Ht 67.0 in | Wt 238.0 lb

## 2020-03-15 DIAGNOSIS — R000 Tachycardia, unspecified: Secondary | ICD-10-CM | POA: Diagnosis not present

## 2020-03-15 DIAGNOSIS — Z7189 Other specified counseling: Secondary | ICD-10-CM | POA: Diagnosis not present

## 2020-03-15 DIAGNOSIS — I10 Essential (primary) hypertension: Secondary | ICD-10-CM

## 2020-03-15 MED ORDER — CARVEDILOL 6.25 MG PO TABS: 6.2500 mg | ORAL_TABLET | Freq: Two times a day (BID) | ORAL | 5 refills | Status: DC

## 2020-03-15 NOTE — Patient Instructions (Signed)
Medication Instructions:  Your physician has recommended you make the following change in your medication:   CONTINUE Amlodipine  CHANGE Carvedilol to 6.25mg  twice daily  *If you need a refill on your cardiac medications before your next appointment, please call your pharmacy*  Lab Work: No lab work ordered today. Complete the previously ordered urine collection at your convenience.  Testing/Procedures: None ordered today.   Follow-Up: At [facility], you and your health needs are our priority.  As part of our continuing mission to provide you with exceptional heart care, we have created designated Provider Care Teams.  These Care Teams include your primary Cardiologist (physician) and Advanced Practice Providers (APPs -  Physician Assistants and Nurse Practitioners) who all work together to provide you with the care you need, when you need it.  We recommend signing up for the patient portal called "MyChart".  Sign up information is provided on this After Visit Summary.  MyChart is used to connect with patients for Virtual Visits (Telemedicine).  Patients are able to view lab/test results, encounter notes, upcoming appointments, etc.  Non-urgent messages can be sent to your provider as well.   To learn more about what you can do with MyChart, go to [email or web address].    Your next appointment:   2 month(s)  The format for your next appointment:   In Person  Provider:   You may see Yvonne Kendall, MD or one of the following Advanced Practice Providers on your designated Care Team:    Nicolasa Ducking, NP  Eula Listen, PA-C  Marisue Ivan, PA-C  Cadence Fransico Michael, [place]  Gillian Shields, NP  Other Instructions  Bring your blood pressure cuff with you to your next office visit.   Tips to Measure your Blood Pressure Correctly  Here's what you can do to ensure a correct reading:  Don't drink a caffeinated beverage or smoke during the 30 minutes before the test.  Sit  quietly for five minutes before the test begins.  During the measurement, sit in a chair with your feet on the floor and your arm supported so your elbow is at about heart level.  The inflatable part of the cuff should completely cover at least 80% of your upper arm, and the cuff should be placed on bare skin, not over a shirt.  Don't talk during the measurement.  Have your blood pressure measured twice, with a brief break in between. If the readings are different by 5 points or more, have it done a third time.  There are times to break these rules. If you sometimes feel lightheaded when getting out of bed in the morning or when you stand after sitting, you should have your blood pressure checked while seated and then while standing to see if it falls from one position to the next.  Because blood pressure varies throughout the day, your doctor will rarely diagnose hypertension on the basis of a single reading. Instead, he or she will want to confirm the measurements on at least two occasions, usually within a few weeks of one another. The exception to this rule is if you have a blood pressure reading of 180/110 mm Hg or higher. A result this high usually calls for prompt treatment.  It's a good idea to have your blood pressure measured in both arms at least once, since the reading in one arm (usually the right) may be higher than that in the left. A 2014 study in The American Journal of Medicine of nearly 3,400 people  found average arm- to-arm differences in systolic blood pressure of about 5 points. The higher number should be used to make treatment decisions.  In 2017, new guidelines from the American Heart Association, the [employer] of Cardiology, and nine other health organizations lowered the diagnosis of high blood pressure to 130/80 mm Hg or higher for all adults. The guidelines also redefined the various blood pressure categories to now include normal, elevated, Stage 1 hypertension,  Stage 2 hypertension, and hypertensive crisis (see "Blood pressure categories").  Blood pressure categories  Blood pressure category SYSTOLIC (upper number)  DIASTOLIC (lower number)  Normal Less than 120 mm Hg and Less than 80 mm Hg  Elevated 120-129 mm Hg and Less than 80 mm Hg  High blood pressure: Stage 1 hypertension 130-139 mm Hg or 80-89 mm Hg  High blood pressure: Stage 2 hypertension 140 mm Hg or higher or 90 mm Hg or higher  Hypertensive crisis (consult your doctor immediately) Higher than 180 mm Hg and/or Higher than 120 mm Hg  Source: American Heart Association and American Stroke Association. For more on getting your blood pressure under control, buy Controlling Your Blood Pressure, a Special Health Report from [facility].   Blood Pressure Log   Date   Time  Blood Pressure  Position  Example: Nov 1 9 AM 124/78 sitting

## 2020-03-17 ENCOUNTER — Encounter: Payer: 59 | Admitting: Internal Medicine

## 2020-04-06 ENCOUNTER — Ambulatory Visit (HOSPITAL_COMMUNITY)
Admission: RE | Admit: 2020-04-06 | Payer: 59 | Source: Ambulatory Visit | Attending: Internal Medicine | Admitting: Internal Medicine

## 2020-04-06 ENCOUNTER — Encounter (HOSPITAL_COMMUNITY): Payer: Self-pay

## 2020-04-08 ENCOUNTER — Telehealth: Payer: Self-pay

## 2020-04-08 NOTE — Telephone Encounter (Signed)
Copied from CRM [id number]. Topic: General - Other >> Apr 08, 2020 10:22 AM Gwenlyn Fudge wrote: Reason for CRM: Pt calling and is requesting to have a new patient appt. Pt is experiencing covid symptoms. Please advise.

## 2020-05-05 ENCOUNTER — Other Ambulatory Visit: Payer: Self-pay | Admitting: Certified Nurse Midwife

## 2020-05-05 DIAGNOSIS — Z1231 Encounter for screening mammogram for malignant neoplasm of breast: Secondary | ICD-10-CM

## 2020-05-16 NOTE — Progress Notes (Deleted)
Office Visit    Patient Name: Krystal Zamora Date of Encounter: 05/16/2020  Primary Care Provider:  Theadore Nan, NP Primary Cardiologist:  Yvonne Kendall, MD Electrophysiologist:  None   Chief Complaint    Krystal Zamora is a 40 y.o. female with a hx of sinus tachycardia, hypertension, hepatitis C, pneumothorax, drug addiction in remission, COVID-19 infection (10/2019) presents today for follow-up of hypertension  Past Medical History    Past Medical History:  Diagnosis Date  . Collapsed lung 2004  . 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 of herion  . Hepatitis C   . Hypertension   . Pneumonia    Past Surgical History:  Procedure Laterality Date  . ASPIRATION BIOPSY     in ICU for aspiration of vomit for 7 weeks  . LIVER BIOPSY  2009  . WISDOM TOOTH EXTRACTION      Allergies  Allergies  Allergen Reactions  . Sulfa Antibiotics Hives  . Sulfasalazine Hives    History of Present Illness    Krystal Zamora is a 39 y.o. female with a hx of sinus tachycardia, hypertension, hepatitis C, pneumothorax, drug addiction in remission, COVID-19 infection (10/2019) last seen 03/15/20.   She was seen in consult due to tachycardia and hypertension.  Noted several year history of hypertension though blood pressure and heart rate seem to increase significantly after she had COVID-19 October 2019.  She was prescribed carvedilol by her primary care provider but had not yet started at time of clinic visit.  She was in the process of trying to wean herself off of Vyvanse as she was worried it was contributory to hypertension and tachycardia.  She was recommended to start amlodipine 5 mg daily, resume Coreg 3.125 mg twice daily as well as renal artery duplex.  Lab work collected showed K3.5, creatinine 0.73, GFR greater than 60, BNP 22.4 hemoglobin 15.6, WBC 11.5, TSH 1.364.  She was also recommended for aldosterone renin ratio and urine metanephrines/catecholamines.  Seen in follow up  03/15/20. She had not yet completed 24-hour urine collection and her renal duplex was upcoming. Her Carvedilol dose was improved for continued optimization of blood pressure.   She presents today for follow up. ***  EKGs/Labs/Other Studies Reviewed:   The following studies were reviewed today:  EKG: No EKG today.  Recent Labs: 02/24/2020: ALT 16; B Natriuretic Peptide 22.4; BUN 14; Creatinine, Ser 0.73; Hemoglobin 15.6; Platelets 341; Potassium 3.5; Sodium 135; TSH 1.364  Recent Lipid Panel    Component Value Date/Time   CHOL 161 10/23/2018 1014   TRIG 122.0 10/23/2018 1014   HDL 57.70 10/23/2018 1014   CHOLHDL 3 10/23/2018 1014   VLDL 24.4 10/23/2018 1014   LDLCALC 79 10/23/2018 1014   LDLDIRECT 84.0 05/14/2017 1556    Home Medications   No outpatient medications have been marked as taking for the 05/17/20 encounter (Appointment) with Alver Sorrow, NP.   Current Facility-Administered Medications for the 05/17/20 encounter (Appointment) with Alver Sorrow, NP  Medication  . PARAGARD INTRAUTERINE COPPER IUD 1 Units     Review of Systems  All other systems reviewed and are otherwise negative except as noted above.  Physical Exam    VS:  There were no vitals taken for this visit. , BMI There is no height or weight on file to calculate BMI.  Wt Readings from Last 3 Encounters:  03/15/20 238 lb (108 kg)  02/24/20 233 lb 2 oz (105.7 kg)  02/15/20 237 lb (  107.5 kg)    GEN: Well nourished, overweight, well developed, in no acute distress. HEENT: normal. Neck: Supple, no JVD, carotid bruits, or masses. Cardiac: RRR, no murmurs, rubs, or gallops. No clubbing, cyanosis, edema.  Radials/DP/PT 2+ and equal bilaterally.  Respiratory:  Respirations regular and unlabored, clear to auscultation bilaterally. GI: Soft, nontender, nondistended. MS: No deformity or atrophy. Skin: Warm and dry, no rash. Neuro:  Strength and sensation are intact. Psych: Normal  affect.  Assessment & Plan    1. Uncontrolled hypertension- Renal duplex ordered though not yet completed. 24-hour urine collection ***. *** I  2. Sinus tachycardia-Echo 02/24/2020 LVEF 55 to 60%, grade 1 diastolic dysfunction, LV global longitudinal strain 11.8%, RV normal size and function, no significant valvular abnormalities.***  Disposition: Follow ***up in 2 month(s) with Dr. Okey Dupre or APP  Signed, Alver Sorrow, NP 05/16/2020, 6:56 PM [facility]

## 2020-05-17 ENCOUNTER — Ambulatory Visit: Payer: 59 | Admitting: Family

## 2020-05-17 ENCOUNTER — Encounter: Payer: Self-pay | Admitting: Family

## 2020-05-27 ENCOUNTER — Other Ambulatory Visit: Payer: Self-pay

## 2020-05-27 ENCOUNTER — Ambulatory Visit
Admission: RE | Admit: 2020-05-27 | Discharge: 2020-05-27 | Disposition: A | Discharge status: Home / Self Care | Payer: 59 | Source: Ambulatory Visit | Attending: Certified Nurse Midwife | Admitting: Certified Nurse Midwife

## 2020-05-27 DIAGNOSIS — Z1231 Encounter for screening mammogram for malignant neoplasm of breast: Secondary | ICD-10-CM | POA: Diagnosis present

## 2020-05-31 ENCOUNTER — Ambulatory Visit: Payer: 59 | Admitting: Family

## 2020-06-06 ENCOUNTER — Telehealth: Payer: Self-pay | Admitting: Family

## 2020-06-06 NOTE — Telephone Encounter (Signed)
  Patient Consent for Virtual Visit         Krystal Zamora has provided verbal consent on 06/06/2020 for a virtual visit (video or telephone).   CONSENT FOR VIRTUAL VISIT FOR:  Krystal Zamora  By participating in this virtual visit I agree to the following:  I hereby voluntarily request, consent and authorize [facility] and its employed or contracted physicians, physician assistants, nurse practitioners or other licensed health care professionals (the Practitioner), to provide me with telemedicine health care services (the "Services") as deemed necessary by the treating Practitioner. I acknowledge and consent to receive the Services by the Practitioner via telemedicine. I understand that the telemedicine visit will involve communicating with the Practitioner through live audiovisual communication technology and the disclosure of certain medical information by electronic transmission. I acknowledge that I have been given the opportunity to request an in-person assessment or other available alternative prior to the telemedicine visit and am voluntarily participating in the telemedicine visit.  I understand that I have the right to withhold or withdraw my consent to the use of telemedicine in the course of my care at any time, without affecting my right to future care or treatment, and that the Practitioner or I may terminate the telemedicine visit at any time. I understand that I have the right to inspect all information obtained and/or recorded in the course of the telemedicine visit and may receive copies of available information for a reasonable fee.  I understand that some of the potential risks of receiving the Services via telemedicine include:  Marland Kitchen Delay or interruption in medical evaluation due to technological equipment failure or disruption; . Information transmitted may not be sufficient (e.g. poor resolution of images) to allow for appropriate medical decision making by the Practitioner;  and/or  . In rare instances, security protocols could fail, causing a breach of personal health information.  Furthermore, I acknowledge that it is my responsibility to provide information about my medical history, conditions and care that is complete and accurate to the best of my ability. I acknowledge that Practitioner's advice, recommendations, and/or decision may be based on factors not within their control, such as incomplete or inaccurate data provided by me or distortions of diagnostic images or specimens that may result from electronic transmissions. I understand that the practice of medicine is not an exact science and that Practitioner makes no warranties or guarantees regarding treatment outcomes. I acknowledge that a copy of this consent can be made available to me via my patient portal [facility] MyChart), or I can request a printed copy by calling the office of [facility].    I understand that my insurance will be billed for this visit.   I have read or had this consent read to me. . I understand the contents of this consent, which adequately explains the benefits and risks of the Services being provided via telemedicine.  . I have been provided ample opportunity to ask questions regarding this consent and the Services and have had my questions answered to my satisfaction. . I give my informed consent for the services to be provided through the use of telemedicine in my medical care

## 2020-06-07 ENCOUNTER — Telehealth (INDEPENDENT_AMBULATORY_CARE_PROVIDER_SITE_OTHER): Payer: 59 | Admitting: Family

## 2020-06-07 ENCOUNTER — Other Ambulatory Visit: Payer: Self-pay

## 2020-06-07 ENCOUNTER — Encounter: Payer: Self-pay | Admitting: Family

## 2020-06-07 VITALS — BP 152/104 | HR 82 | Ht 67.0 in | Wt 238.0 lb

## 2020-06-07 DIAGNOSIS — I10 Essential (primary) hypertension: Secondary | ICD-10-CM | POA: Diagnosis not present

## 2020-06-07 DIAGNOSIS — R000 Tachycardia, unspecified: Secondary | ICD-10-CM | POA: Diagnosis not present

## 2020-06-07 MED ORDER — AMLODIPINE BESYLATE 5 MG PO TABS: 5.0000 mg | ORAL_TABLET | Freq: Every day | ORAL | 1 refills | Status: DC

## 2020-06-07 NOTE — Progress Notes (Signed)
Virtual Visit via Telephone Note   This visit type was conducted due to national recommendations for restrictions regarding the COVID-19 Pandemic (e.g. social distancing) in an effort to limit this patient's exposure and mitigate transmission in our community.  Due to her co-morbid illnesses, this patient is at least at moderate risk for complications without adequate follow up.  This format is felt to be most appropriate for this patient at this time.  The patient did not have access to video technology/had technical difficulties with video requiring transitioning to audio format only (telephone).  All issues noted in this document were discussed and addressed.  No physical exam could be performed with this format.  Please refer to the patient's chart for her  consent to telehealth for [facility].    Date:  06/07/2020   ID:  Krystal Zamora, DOB 08-28-1980, MRN [id number] The patient was identified using 2 identifiers.  Patient Location: Home Provider Location: Office/Clinic   PCP:  [facility]  Cardiologist:  Yvonne Kendall, MD  Advanced Practice Provider:  No care team member to display Electrophysiologist:  None   Evaluation Performed:  Follow-Up Visit  Chief Complaint:  Follow up hypertension  History of Present Illness:    Krystal Zamora is a 40 y.o. female with sinus tachycardia, hypertension, hepatitis C, pneumothorax, drug addiction in remission, COVID-19 infection (10/2018) not requiring hospitalization.  She was last seen 03/15/20.    She was seen in consult due to tachycardia and hypertension.  Noted several year history of hypertension though blood pressure and heart rate seem to increase significantly after she had COVID-19 October 2019.  She was prescribed carvedilol by her primary care provider but had not yet started at time of clinic visit.  She was in the process of trying to wean herself off of Vyvanse as she was  worried it was contributory to hypertension and tachycardia.  She was recommended to start amlodipine 5 mg daily, resume Coreg 3.125 mg twice daily as well as renal artery duplex.  Lab work collected showed K3.5, creatinine 0.73, GFR greater than 60, BNP 22.4 hemoglobin 15.6, WBC 11.5, TSH 1.364.  She was also recommended for aldosterone renin ratio and urine metanephrines/catecholamines.  At follow-up 03/15/2020 she was doing overall well the blood pressure remained mildly elevated and her amlodipine was increased to 5 mg daily.  Since that time she was seen by outside provider who reduced her dose of Vyvanse and added Guafacine to her regimen.  They recommended she stop carvedilol.  Blood pressure cuff at home when checked intermittently 150s over 100s, she wonders if the dizziness reading and plans to return next clinic visit.  She has been seen by outside providers twice since medication changes with blood pressures both less than 130/80.  Reports no shortness of breath nor dyspnea on exertion. Reports no chest pain, pressure, or tightness. No edema, orthopnea, PND. Reports no palpitations.   The patient does not have symptoms concerning for COVID-19 infection (fever, chills, cough, or new shortness of breath).    Past Medical History:  Diagnosis Date  . Collapsed lung 2004  . 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 of herion  . Hepatitis C   . Hypertension   . Pneumonia    Past Surgical History:  Procedure Laterality Date  . ASPIRATION BIOPSY     in ICU for aspiration of vomit for 7 weeks  . LIVER BIOPSY  2009  .  WISDOM TOOTH EXTRACTION       Current Meds  Medication Sig  . buPROPion (WELLBUTRIN XL) 150 MG 24 hr tablet Take 150 mg by mouth every morning.  . etonogestrel-ethinyl estradiol (NUVARING) 0.12-0.015 MG/24HR vaginal ring Place 1 each vaginally every 28 (twenty-eight) days.  Marland Kitchen guanFACINE (INTUNIV) 1 MG TB24 ER tablet Take 1 mg by mouth daily.  Marland Kitchen lisdexamfetamine  (VYVANSE) 30 MG capsule Take 30 mg by mouth daily.  . [DISCONTINUED] amLODipine (NORVASC) 5 MG tablet Take 1 tablet (5 mg total) by mouth daily.     Allergies:   Sulfa antibiotics and Sulfasalazine   Social History   Tobacco Use  . Smoking status: Current Every Day Smoker    Packs/day: 0.50    Years: 20.00    Pack years: 10.00    Types: Cigarettes    Last attempt to quit: 12/12/2005    Years since quitting: 14.4  . Smokeless tobacco: Never Used  Vaping Use  . Vaping Use: Never used  Substance Use Topics  . Alcohol use: Yes    Alcohol/week: 6.0 standard drinks    Types: 6 Cans of beer per week    Comment: once a month  . Drug use: Not Currently    Comment: Heroin - stopped 2004     Family Hx: The patient's family history includes Diabetes in her mother. There is no history of Other. She was adopted.  ROS:   Please see the history of present illness.     All other systems reviewed and are negative.   Prior CV studies:   The following studies were reviewed today:  Echo 02/24/20  1. Left ventricular ejection fraction, by estimation, is 55 to 60%. The  left ventricle has normal function. Left ventricular endocardial border  not optimally defined to evaluate regional wall motion. Left ventricular  diastolic parameters are consistent  with Grade I diastolic dysfunction (impaired relaxation). The average left  ventricular global longitudinal strain is -11.8 %. The global longitudinal  strain is abnormal.   2. Right ventricular systolic function is normal. The right ventricular  size is normal. Tricuspid regurgitation signal is inadequate for assessing  PA pressure.   3. The mitral valve is grossly normal. No evidence of mitral valve  regurgitation. No evidence of mitral stenosis.   4. The aortic valve was not well visualized. Aortic valve regurgitation  is not visualized. No aortic stenosis is present.   5. The inferior vena cava is normal in size with greater than 50%   respiratory variability, suggesting right atrial pressure of 3 mmHg.   Labs/Other Tests and Data Reviewed:    EKG:  No ECG reviewed.  Recent Labs: 02/24/2020: ALT 16; B Natriuretic Peptide 22.4; BUN 14; Creatinine, Ser 0.73; Hemoglobin 15.6; Platelets 341; Potassium 3.5; Sodium 135; TSH 1.364   Recent Lipid Panel Lab Results  Component Value Date/Time   CHOL 161 10/23/2018 10:14 AM   TRIG 122.0 10/23/2018 10:14 AM   HDL 57.70 10/23/2018 10:14 AM   CHOLHDL 3 10/23/2018 10:14 AM   LDLCALC 79 10/23/2018 10:14 AM   LDLDIRECT 84.0 05/14/2017 03:56 PM    Wt Readings from Last 3 Encounters:  06/07/20 238 lb (108 kg)  03/15/20 238 lb (108 kg)  02/24/20 233 lb 2 oz (105.7 kg)     Objective:    Vital Signs:  BP (!) 152/104 (BP Location: Left Arm, Patient Position: Sitting)   Pulse 82   Ht 5\' 7"  (1.702 m)   Wt 238 lb (  108 kg)   LMP 05/26/2020   BMI 37.28 kg/m    VITAL SIGNS:  reviewed  ASSESSMENT & PLAN:    1. HTN -BP mildly elevated on home check though at recent in person visit at [facility] clinic has been well controlled at goal of less than 130/80.  Carvedilol was discontinued by outside provider when Guafacine was added.  As blood pressure is well controlled we will defer resumption.  She will continue amlodipine 5 mg daily.  Did recommend that she complete renal artery duplex as previously ordered by Dr. Okey Dupre.  Encouraged bring her blood pressure cuff to her next office visit.  Discussed low-salt diet and regular cardiovascular exercise for optimal control of blood pressure.  2. Sinus tachycardia - No palpitations since carvedilol was discontinued.  Echo 02/24/2019 with no significant valvular abnormalities.  Medication for further work-up at this time.  Time:   Today, I have spent 11 minutes with the patient with telehealth technology discussing the above problems.     Medication Adjustments/Labs and Tests Ordered: Current medicines are reviewed at length with the patient  today.  Concerns regarding medicines are outlined above.   Tests Ordered: No orders of the defined types were placed in this encounter.   Medication Changes: Meds ordered this encounter  Medications  . amLODipine (NORVASC) 5 MG tablet    Sig: Take 1 tablet (5 mg total) by mouth daily.    Dispense:  90 tablet    Refill:  1    Order Specific Question:   Supervising Provider    Answer:   Baldo Daub [[id number]]    Follow Up:  In Person in 2 month(s)  Signed, Alver Sorrow, NP  06/07/2020 9:03 AM    [facility]

## 2020-06-07 NOTE — Patient Instructions (Signed)
Medication Instructions:  Continue your current medications.   *If you need a refill on your cardiac medications before your next appointment, please call your pharmacy*   Lab Work: None ordered today.   Testing/Procedures: Recommend you have a renal ultrasound as previously ordered prior to your next appointment. Simply call the office at [phone] to schedule.   Follow-Up: At [facility], you and your health needs are our priority.  As part of our continuing mission to provide you with exceptional heart care, we have created designated Provider Care Teams.  These Care Teams include your primary Cardiologist (physician) and Advanced Practice Providers (APPs -  Physician Assistants and Nurse Practitioners) who all work together to provide you with the care you need, when you need it.  We recommend signing up for the patient portal called "MyChart".  Sign up information is provided on this After Visit Summary.  MyChart is used to connect with patients for Virtual Visits (Telemedicine).  Patients are able to view lab/test results, encounter notes, upcoming appointments, etc.  Non-urgent messages can be sent to your provider as well.   To learn more about what you can do with MyChart, go to [email or web address].    Your next appointment:   2 month(s)  The format for your next appointment:   In Person  Provider:   You may see Yvonne Kendall, MD or one of the following Advanced Practice Providers on your designated Care Team:    Nicolasa Ducking, NP  Eula Listen, PA-C  Marisue Ivan, PA-C  Cadence Fransico Michael, [place]  Gillian Shields, NP   Other Instructions   Please bring your blood pressure cuff to your next office visit so we can assess its accuracy.   Heart Healthy Diet Recommendations: A low-salt diet is recommended. Meats should be grilled, baked, or boiled. Avoid fried foods. Focus on lean protein sources like fish or chicken with vegetables and fruits. The American  Heart Association is a Chief Technology Officer!  American Heart Association Diet and Lifeystyle Recommendations   Exercise recommendations: The American Heart Association recommends 150 minutes of moderate intensity exercise weekly. Try 30 minutes of moderate intensity exercise 4-5 times per week. This could include walking, jogging, or swimming.

## 2020-07-07 ENCOUNTER — Other Ambulatory Visit: Payer: Self-pay

## 2020-07-07 ENCOUNTER — Emergency Department: Payer: 59

## 2020-07-07 ENCOUNTER — Emergency Department
Admission: EM | Admit: 2020-07-07 | Discharge: 2020-07-07 | Disposition: A | Discharge status: Home / Self Care | Payer: 59 | Attending: Emergency Medicine | Admitting: Emergency Medicine

## 2020-07-07 DIAGNOSIS — K529 Noninfective gastroenteritis and colitis, unspecified: Secondary | ICD-10-CM

## 2020-07-07 DIAGNOSIS — Z79899 Other long term (current) drug therapy: Secondary | ICD-10-CM | POA: Insufficient documentation

## 2020-07-07 DIAGNOSIS — F17210 Nicotine dependence, cigarettes, uncomplicated: Secondary | ICD-10-CM | POA: Insufficient documentation

## 2020-07-07 DIAGNOSIS — R1031 Right lower quadrant pain: Secondary | ICD-10-CM | POA: Diagnosis present

## 2020-07-07 DIAGNOSIS — R197 Diarrhea, unspecified: Secondary | ICD-10-CM

## 2020-07-07 DIAGNOSIS — R1030 Lower abdominal pain, unspecified: Secondary | ICD-10-CM

## 2020-07-07 DIAGNOSIS — I10 Essential (primary) hypertension: Secondary | ICD-10-CM | POA: Insufficient documentation

## 2020-07-07 LAB — CBC WITH DIFFERENTIAL/PLATELET
Abs Immature Granulocytes: 0.03 10*3/uL (ref 0.00–0.07)
Basophils Absolute: 0 10*3/uL (ref 0.0–0.1)
Basophils Relative: 0 %
Eosinophils Absolute: 0.2 10*3/uL (ref 0.0–0.5)
Eosinophils Relative: 2 %
HCT: 41.7 % (ref 36.0–46.0)
Hemoglobin: 14.8 g/dL (ref 12.0–15.0)
Immature Granulocytes: 0 %
Lymphocytes Relative: 26 %
Lymphs Abs: 2.5 10*3/uL (ref 0.7–4.0)
MCH: 31.9 pg (ref 26.0–34.0)
MCHC: 35.5 g/dL (ref 30.0–36.0)
MCV: 89.9 fL (ref 80.0–100.0)
Monocytes Absolute: 0.8 10*3/uL (ref 0.1–1.0)
Monocytes Relative: 8 %
Neutro Abs: 6.1 10*3/uL (ref 1.7–7.7)
Neutrophils Relative %: 64 %
Platelets: 291 10*3/uL (ref 150–400)
RBC: 4.64 MIL/uL (ref 3.87–5.11)
RDW: 12 % (ref 11.5–15.5)
WBC: 9.6 10*3/uL (ref 4.0–10.5)
nRBC: 0 % (ref 0.0–0.2)

## 2020-07-07 LAB — COMPREHENSIVE METABOLIC PANEL
ALT: 13 U/L (ref 0–44)
AST: 20 U/L (ref 15–41)
Albumin: 3.7 g/dL (ref 3.5–5.0)
Alkaline Phosphatase: 61 U/L (ref 38–126)
Anion gap: 9 (ref 5–15)
BUN: 8 mg/dL (ref 6–20)
CO2: 23 mmol/L (ref 22–32)
Calcium: 9.1 mg/dL (ref 8.9–10.3)
Chloride: 103 mmol/L (ref 98–111)
Creatinine, Ser: 0.64 mg/dL (ref 0.44–1.00)
GFR, Estimated: >60 mL/min (ref >60)
Glucose, Bld: 145 mg/dL — ABNORMAL HIGH (ref 70–99)
Potassium: 3.2 mmol/L — ABNORMAL LOW (ref 3.5–5.1)
Sodium: 135 mmol/L (ref 135–145)
Total Bilirubin: 0.9 mg/dL (ref 0.3–1.2)
Total Protein: 7.5 g/dL (ref 6.5–8.1)

## 2020-07-07 LAB — LIPASE, BLOOD: Lipase: 29 U/L (ref 11–51)

## 2020-07-07 LAB — LACTIC ACID, PLASMA: Lactic Acid, Venous: 1.8 mmol/L (ref 0.5–1.9)

## 2020-07-07 LAB — PREGNANCY, URINE: Preg Test, Ur: NEGATIVE

## 2020-07-07 MED ORDER — IOHEXOL 300 MG/ML  SOLN
125.0000 mL | Freq: Once | INTRAMUSCULAR | Status: AC | PRN
  Administered 2020-07-07: 125 mL via INTRAVENOUS

## 2020-07-07 MED ORDER — METRONIDAZOLE 500 MG PO TABS: 500.0000 mg | ORAL_TABLET | Freq: Three times a day (TID) | ORAL | 0 refills | Status: AC

## 2020-07-07 MED ORDER — CIPROFLOXACIN HCL 500 MG PO TABS: 500.0000 mg | ORAL_TABLET | Freq: Two times a day (BID) | ORAL | 0 refills | Status: AC

## 2020-07-07 MED ORDER — KETOROLAC TROMETHAMINE 30 MG/ML IJ SOLN
30.0000 mg | Freq: Once | INTRAMUSCULAR | Status: AC
  Administered 2020-07-07: 30 mg via INTRAVENOUS
  Filled 2020-07-07: qty 1

## 2020-07-07 NOTE — ED Provider Notes (Signed)
[facility] Emergency Department Provider Note   ____________________________________________   Event Date/Time   First MD Initiated Contact with Patient 07/07/20 0900     (approximate)  I have reviewed the triage vital signs and the nursing notes.   HISTORY  Chief Complaint Abdominal Pain    HPI Krystal Zamora is a 40 y.o. female with a past medical history of hypertension and previous heroin abuse who presents for abdominal pain with associated bloody diarrhea over the last 3 days.  Patient describes a bilateral lower abdominal quadrant pain that is cramping, 8/10, nonradiating, and slightly relieved after defecation.  Patient denies any similar symptoms in the past.  Patient denies any recent travel or sick contacts.  Patient currently denies any vision changes, tinnitus, difficulty speaking, facial droop, sore throat, chest pain, shortness of breath, nausea/vomiting/diarrhea, dysuria, or weakness/numbness/paresthesias in any extremity         Past Medical History:  Diagnosis Date  . Collapsed lung 2004  . 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 of herion  . Hepatitis C   . Hypertension   . Pneumonia     Patient Active Problem List   Diagnosis Date Noted  . Hypertension 02/08/2020  . Tachycardia 02/08/2020  . Snoring 02/08/2020  . Vitamin D deficiency 10/24/2018  . Elevated blood pressure reading with diagnosis of hypertension 10/23/2018  . Drug addiction in remission (HCC) 09/25/2018  . Attention deficit hyperactivity disorder (ADHD) 09/25/2018  . Asymmetric SNHL (sensorineural hearing loss) 01/03/2017  . Tinnitus aurium, left 01/03/2017  . Anxiety and depression 10/15/2014  . Hepatitis C 05/16/2011    Past Surgical History:  Procedure Laterality Date  . ASPIRATION BIOPSY     in ICU for aspiration of vomit for 7 weeks  . LIVER BIOPSY  2009  . WISDOM TOOTH EXTRACTION      Prior to Admission medications   Medication Sig Start  Date End Date Taking? Authorizing Provider  ciprofloxacin (CIPRO) 500 MG tablet Take 1 tablet (500 mg total) by mouth 2 (two) times daily for 5 days. 07/07/20 07/12/20 Yes Kayston Jodoin, Clent Jacks, MD  metroNIDAZOLE (FLAGYL) 500 MG tablet Take 1 tablet (500 mg total) by mouth 3 (three) times daily for 5 days. 07/07/20 07/12/20 Yes Jaire Pinkham, Clent Jacks, MD  amLODipine (NORVASC) 5 MG tablet Take 1 tablet (5 mg total) by mouth daily. 06/07/20 12/04/20  Alver Sorrow, NP  buPROPion (WELLBUTRIN XL) 150 MG 24 hr tablet Take 150 mg by mouth every morning. 05/11/20   [provider]  etonogestrel-ethinyl estradiol (NUVARING) 0.12-0.015 MG/24HR vaginal ring Place 1 each vaginally every 28 (twenty-eight) days. 05/05/20 05/05/21  [provider]  guanFACINE (INTUNIV) 1 MG TB24 ER tablet Take 1 mg by mouth daily. 04/08/20   [provider]  lisdexamfetamine (VYVANSE) 30 MG capsule Take 30 mg by mouth daily.    [provider]    Allergies Sulfa antibiotics and Sulfasalazine  Family History  Adopted: Yes  Problem Relation Age of Onset  . Diabetes Mother   . Other Neg Hx     Social History Social History   Tobacco Use  . Smoking status: Current Every Day Smoker    Packs/day: 0.50    Years: 20.00    Pack years: 10.00    Types: Cigarettes    Last attempt to quit: 12/12/2005    Years since quitting: 14.5  . Smokeless tobacco: Never Used  Vaping Use  . Vaping Use: Never used  Substance Use  Topics  . Alcohol use: Yes    Alcohol/week: 6.0 standard drinks    Types: 6 Cans of beer per week    Comment: once a month  . Drug use: Not Currently    Comment: Heroin - stopped 2004    Review of Systems Constitutional: No fever/chills Eyes: No visual changes. ENT: No sore throat. Cardiovascular: Denies chest pain. Respiratory: Denies shortness of breath. Gastrointestinal: Endorses abdominal pain.  No nausea, no vomiting.  Endorses bloody diarrhea. Genitourinary: Negative for  dysuria. Musculoskeletal: Negative for acute arthralgias Skin: Negative for rash. Neurological: Negative for headaches, weakness/numbness/paresthesias in any extremity Psychiatric: Negative for suicidal ideation/homicidal ideation   ____________________________________________   PHYSICAL EXAM:  VITAL SIGNS: ED Triage Vitals  Enc Vitals Group     BP 07/07/20 0849 (!) 140/113     Pulse Rate 07/07/20 0849 (!) 103     Resp 07/07/20 0849 18     Temp 07/07/20 0849 98.4 F (36.9 C)     Temp Source 07/07/20 0849 Oral     SpO2 07/07/20 0849 96 %     Weight 07/07/20 0847 230 lb (104.3 kg)     Height 07/07/20 0847 5\' 7"  (1.702 m)     Head Circumference --      Peak Flow --      Pain Score 07/07/20 0847 5     Pain Loc --      Pain Edu? --      Excl. in GC? --    Constitutional: Alert and oriented. Well appearing and in no acute distress. Eyes: Conjunctivae are normal. PERRL. Head: Atraumatic. Nose: No congestion/rhinnorhea. Mouth/Throat: Mucous membranes are moist. Neck: No stridor Cardiovascular: Grossly normal heart sounds.  Good peripheral circulation. Respiratory: Normal respiratory effort.  No retractions. Gastrointestinal: Soft and mild bilateral lower quadrant abdominal tenderness to palpation. No distention. Musculoskeletal: No obvious deformities Neurologic:  Normal speech and language. No gross focal neurologic deficits are appreciated. Skin:  Skin is warm and dry. No rash noted. Psychiatric: Mood and affect are normal. Speech and behavior are normal.  ____________________________________________   LABS (all labs ordered are listed, but only abnormal results are displayed)  Labs Reviewed  COMPREHENSIVE METABOLIC PANEL - Abnormal; Notable for the following components:      Result Value   Potassium 3.2 (*)    Glucose, Bld 145 (*)    All other components within normal limits  CBC WITH DIFFERENTIAL/PLATELET  LACTIC ACID, PLASMA  LIPASE, BLOOD  PREGNANCY, URINE    ____________________________________________  EKG  ED ECG REPORT I, 09/06/20, the attending physician, personally viewed and interpreted this ECG.  Date: 07/07/2020 EKG Time: 0847 Rate: 102 Rhythm: Tachycardic sinus rhythm QRS Axis: normal Intervals: normal ST/T Wave abnormalities: normal Narrative Interpretation: no evidence of acute ischemia  ____________________________________________  RADIOLOGY  ED MD interpretation: CT of the abdomen and pelvis with IV contrast shows acute colitis without complication  Official radiology report(s): CT Abdomen Pelvis W Contrast  Result Date: 07/07/2020 CLINICAL DATA:  40 year old female with abdominal pain for 3 days. Diarrhea since yesterday with some blood in stool. EXAM: CT ABDOMEN AND PELVIS WITH CONTRAST TECHNIQUE: Multidetector CT imaging of the abdomen and pelvis was performed using the standard protocol following bolus administration of intravenous contrast. CONTRAST:  46 OMNIPAQUE IOHEXOL 300 MG/ML  SOLN COMPARISON:  CTA chest 12/13/2010. FINDINGS: Lower chest: Chronic scarring in the posterior basal segment of the left lower lobe, costophrenic angle is unchanged since 2012. Otherwise negative. Hepatobiliary: Negative liver and  gallbladder. Pancreas: Negative. Spleen: Negative. Adrenals/Urinary Tract: Normal adrenal glands. Kidneys are symmetric and within normal limits. Symmetric renal contrast excretion on the delayed images. Negative ureters, urinary bladder. Incidental pelvic phleboliths. Stomach/Bowel: Negative descending and rectosigmoid colon. Long segment inflammation of the transverse colon with wall thickening, edema, and mesenteric inflammation (series 5, image 24). Narrowing of the lumen, but upstream right colon is decompressed. Roughly 15 cm of the colon is inflamed. No extraluminal gas. No fluid collection. Superimposed normal retrocecal appendix. Negative terminal ileum. No dilated small bowel. Negative stomach and  duodenum. No free air. Vascular/Lymphatic: Major arterial structures are patent and appear normal. Portal venous system is patent. No lymphadenopathy. Reproductive: Small simple appearing right ovarian cyst. No follow-up imaging recommended. Reference: JACR 2020 Feb; 17(2):248-254. Small 11 mm uterine fundal fibroid. Other: Trace simple appearing free fluid in the pelvis. Musculoskeletal: Chronic disc degeneration and endplate spurring in the lower thoracic and upper lumbar spine. Degenerative appearing right acetabular subchondral cysts and sclerosis. No acute osseous abnormality identified. IMPRESSION: 1. Acute Colitis involving a 15 cm segment of the transverse colon. No associated obstruction, abscess, or other complicating features. 2. Trace free fluid in the pelvis, likely physiologic. 3. Otherwise negative CT Abdomen and Pelvis. Electronically Signed   By: Odessa Fleming M.D.   On: 07/07/2020 11:18    ____________________________________________   PROCEDURES  Procedure(s) performed (including Critical Care):  Procedures   ____________________________________________   INITIAL IMPRESSION / ASSESSMENT AND PLAN / ED COURSE  As part of my medical decision making, I reviewed the following data within the electronic MEDICAL RECORD NUMBER Nursing notes reviewed and incorporated, Labs reviewed, EKG interpreted, Old chart reviewed, Radiograph reviewed and Notes from prior ED visits reviewed and incorporated        Patient has colitis that is amenable to oral antibiotics. Patient has no peritoneal signs or signs of perforation. Patients symptoms not typical for other emergent causes of abdominal pain such as, but not limited to, appendicitis, abdominal aortic aneurysm, surgical biliary disease, acute coronary syndrome, etc.  Patient will be discharged with strict return precautions and follow up with primary MD within 12-24 hours for further evaluation.  Patient understands that they may require  admission and IV antibiotics and possibly surgery if they do not improve with oral antibiotics.  Clinical Course as of 07/07/20 1129  Thu Jul 07, 2020  1122 Alkaline Phosphatase: 61 [EB]    Clinical Course User Index [EB] Merwyn Katos, MD     ____________________________________________   FINAL CLINICAL IMPRESSION(S) / ED DIAGNOSES  Final diagnoses:  Colitis  Bloody diarrhea  Lower abdominal pain     ED Discharge Orders         Ordered    ciprofloxacin (CIPRO) 500 MG tablet  2 times daily        07/07/20 1129    metroNIDAZOLE (FLAGYL) 500 MG tablet  3 times daily        07/07/20 1129           Note:  This document was prepared using Dragon voice recognition software and may include unintentional dictation errors.   Merwyn Katos, MD 07/07/20 1130

## 2020-07-07 NOTE — ED Triage Notes (Signed)
Pt reports abd pain x 3 days with diarrhea that began yesterday, noticed some blood in stool yesterday.

## 2020-07-07 NOTE — ED Notes (Signed)
Assigned as Primary RN at this time. Patient is lying comfortably and in no acute distress. Will continue to monitor and assess.  

## 2020-08-09 ENCOUNTER — Encounter: Payer: Self-pay | Admitting: Family

## 2020-08-10 ENCOUNTER — Ambulatory Visit: Payer: 59 | Admitting: Internal Medicine

## 2020-08-19 ENCOUNTER — Ambulatory Visit: Payer: 59 | Admitting: Internal Medicine

## 2021-01-19 ENCOUNTER — Encounter: Payer: Self-pay | Admitting: Medical

## 2021-01-19 ENCOUNTER — Other Ambulatory Visit: Payer: Self-pay

## 2021-01-19 ENCOUNTER — Ambulatory Visit (INDEPENDENT_AMBULATORY_CARE_PROVIDER_SITE_OTHER): Payer: 59 | Admitting: Medical

## 2021-01-19 VITALS — BP 148/108 | HR 90 | Ht 67.0 in | Wt 269.5 lb

## 2021-01-19 DIAGNOSIS — R000 Tachycardia, unspecified: Secondary | ICD-10-CM | POA: Diagnosis not present

## 2021-01-19 DIAGNOSIS — I10 Essential (primary) hypertension: Secondary | ICD-10-CM

## 2021-01-19 MED ORDER — CARVEDILOL 6.25 MG PO TABS: 6.2500 mg | ORAL_TABLET | Freq: Two times a day (BID) | ORAL | 1 refills | Status: DC

## 2021-01-19 NOTE — Patient Instructions (Signed)
Medication Instructions:  Your physician has recommended you make the following change in your medication:   START Carvedilol (Coreg) 6.25 mg twice a day  *If you need a refill on your cardiac medications before your next appointment, please call your pharmacy*   Lab Work: None  If you have labs (blood work) drawn today and your tests are completely normal, you will receive your results only by: MyChart Message (if you have MyChart) OR A paper copy in the mail If you have any lab test that is abnormal or we need to change your treatment, we will call you to review the results.   Testing/Procedures: None   Follow-Up: At [facility], you and your health needs are our priority.  As part of our continuing mission to provide you with exceptional heart care, we have created designated Provider Care Teams.  These Care Teams include your primary Cardiologist (physician) and Advanced Practice Providers (APPs -  Physician Assistants and Nurse Practitioners) who all work together to provide you with the care you need, when you need it.   Your next appointment:   2 week(s)  The format for your next appointment:   In Person  Provider:   You may see Yvonne Kendall, MD or one of the following Advanced Practice Providers on your designated Care Team:   Nicolasa Ducking, NP Eula Listen, PA-C Marisue Ivan, PA-C Cadence Tremont, [place]

## 2021-01-19 NOTE — Progress Notes (Addendum)
Cardiology Office Note:    Date:  01/19/2021   ID:  Krystal Zamora, DOB Dec 02, 1980, MRN [id number]  PCP:  Nira Retort  [facility] Cardiologist:  Yvonne Kendall, MD  [facility] Electrophysiologist:  None   Referring MD: Nira Retort   Chief Complaint:  follow-up BP  History of Present Illness:    Krystal Zamora is a 40 y.o. female with a hx of sinus tachycardia, hypertension, hepatitis C, pneumothorax, drug addiction in remission, COVID-19 infection (10/2019) presents today for follow-up who presents for follow-up.   Seen initially in 2021 for HTN and tachycardia. Echo 02/2020 showed LVEF 55-60%, G1DD. She was started on amlodipine and coreg.  Last seen 03/2020 and was doing well. Tolerating coreg for tachycardia. BP was sill uncontrolled and coreg was increased.   Today, the patient reports high BP. She stopped her bp meds at least a year ago. She went to the bariatric clinic and they noted high blood pressure. Does not check bp at home.  Not on any medications at the moment. Does not see PCP regularly. Interested in losing weight with phentermine. No chest pain. Has some exertional dyspnea from deconditioning and being overweight. Gained weight after stopping Vyvanse. No LLE, orthopnea, pnd, palpitations. Has OSA, wears mouthpeice twice a week. She intermittently smokes and drinks alcohol. No drug/cocaine use.   Past Medical History:  Diagnosis Date   Collapsed lung 2004   Drug addiction in remission [facility])    detox. of herion   Hepatitis C    Hypertension    Pneumonia     Past Surgical History:  Procedure Laterality Date   ASPIRATION BIOPSY     in ICU for aspiration of vomit for 7 weeks   LIVER BIOPSY  2009   WISDOM TOOTH EXTRACTION      Current Medications: Current Meds  Medication Sig   carvedilol (COREG) 6.25 MG tablet Take 1 tablet (6.25 mg total) by mouth 2 (two) times daily.     Allergies:   Sulfa antibiotics and Sulfasalazine   Social  History   Socioeconomic History   Marital status: Married    Spouse name: Not on file   Number of children: Not on file   Years of education: Not on file   Highest education level: Not on file  Occupational History   Not on file  Tobacco Use   Smoking status: Every Day    Packs/day: 0.50    Years: 20.00    Pack years: 10.00    Types: Cigarettes    Last attempt to quit: 12/12/2005    Years since quitting: 15.1   Smokeless tobacco: Never  Vaping Use   Vaping Use: Never used  Substance and Sexual Activity   Alcohol use: Yes    Alcohol/week: 6.0 standard drinks    Types: 6 Cans of beer per week    Comment: once a month   Drug use: Not Currently    Comment: Heroin - stopped 2004   Sexual activity: Yes    Partners: Male    Birth control/protection: I.U.D.  Other Topics Concern   Not on file  Social History Narrative   Not on file   Social Determinants of Health   Financial Resource Strain: Not on file  Food Insecurity: Not on file  Transportation Needs: Not on file  Physical Activity: Not on file  Stress: Not on file  Social Connections: Not on file     Family History: The patient's family history includes Diabetes in her mother. There  is no history of Other. She was adopted.  ROS:   Please see the history of present illness.     All other systems reviewed and are negative.  EKGs/Labs/Other Studies Reviewed:    The following studies were reviewed today:  Echo 02/2020  1. Left ventricular ejection fraction, by estimation, is 55 to 60%. The  left ventricle has normal function. Left ventricular endocardial border  not optimally defined to evaluate regional wall motion. Left ventricular  diastolic parameters are consistent  with Grade I diastolic dysfunction (impaired relaxation). The average left  ventricular global longitudinal strain is -11.8 %. The global longitudinal  strain is abnormal.   2. Right ventricular systolic function is normal. The right  ventricular  size is normal. Tricuspid regurgitation signal is inadequate for assessing  PA pressure.   3. The mitral valve is grossly normal. No evidence of mitral valve  regurgitation. No evidence of mitral stenosis.   4. The aortic valve was not well visualized. Aortic valve regurgitation  is not visualized. No aortic stenosis is present.   5. The inferior vena cava is normal in size with greater than 50%  respiratory variability, suggesting right atrial pressure of 3 mmHg.   EKG:  EKG is  ordered today.  The ekg ordered today demonstrates NSR, 90bpm, nonspecific T wave changes  Recent Labs: 02/24/2020: B Natriuretic Peptide 22.4; TSH 1.364 07/07/2020: ALT 13; BUN 8; Creatinine, Ser 0.64; Hemoglobin 14.8; Platelets 291; Potassium 3.2; Sodium 135  Recent Lipid Panel    Component Value Date/Time   CHOL 161 10/23/2018 1014   TRIG 122.0 10/23/2018 1014   HDL 57.70 10/23/2018 1014   CHOLHDL 3 10/23/2018 1014   VLDL 24.4 10/23/2018 1014   LDLCALC 79 10/23/2018 1014   LDLDIRECT 84.0 05/14/2017 1556    Physical Exam:    VS:  BP (!) 148/108 (BP Location: Left Arm, Patient Position: Sitting, Cuff Size: Large) Comment: After EKG  Pulse 90   Ht 5\' 7"  (1.702 m)   Wt 269 lb 8 oz (122.2 kg)   SpO2 98%   BMI 42.21 kg/m     Wt Readings from Last 3 Encounters:  01/19/21 269 lb 8 oz (122.2 kg)  07/07/20 230 lb (104.3 kg)  06/07/20 238 lb (108 kg)     GEN:  Well nourished, well developed in no acute distress HEENT: Normal NECK: No JVD; No carotid bruits LYMPHATICS: No lymphadenopathy CARDIAC: RRR, no murmurs, rubs, gallops RESPIRATORY:  Clear to auscultation without rales, wheezing or rhonchi  ABDOMEN: Soft, non-tender, non-distended MUSCULOSKELETAL:  No edema; No deformity  SKIN: Warm and dry NEUROLOGIC:  Alert and oriented x 3 PSYCHIATRIC:  Normal affect   ASSESSMENT:    1. Essential hypertension   2. Sinus tachycardia    PLAN:    In order of problems listed  above:  HTN Patient self- discontinued her coreg and amlodipine at least a year ago for unknown reason. Does not check her blood pressure at home. Recently went to the bariatric clinic to inquire about weight loss and noted bp was high. Today bp 152/110. She was previously on coreg and amlodipine. I will restart coreg 6.26mg BID. We will see her back in 2 weeks for BP check. Lifestyle changes discussed.   Tachycardia EKG shows NSR with heart rate 90bpm.   Disposition: Follow up in 2 week(s) with MD/APP     Signed, Jailyn Leeson 08/07/20, PA-C  01/19/2021 9:30 AM    [facility]

## 2021-01-20 NOTE — Addendum Note (Signed)
Addended by: Kendrick Fries on: 01/20/2021 01:24 PM   Modules accepted: Orders

## 2021-02-02 NOTE — Progress Notes (Signed)
Cardiology Office Note:    Date:  02/03/2021   ID:  Krystal Zamora, DOB 01-30-1981, MRN [id number]  PCP:  Nira Retort  [facility] Cardiologist:  Yvonne Kendall, MD  [facility] Electrophysiologist:  None   Referring MD: Nira Retort   Chief Complaint: 2-3 weeks follow-up  History of Present Illness:    Krystal Zamora is a 40 y.o. female with a hx of sinus tachycardia, hypertension, hepatitis C, pneumothorax, drug addiction in remission, COVID-19 infection (10/2019) presents today for follow-up who presents for follow-up.    Seen initially in 2021 for HTN and tachycardia. Echo 02/2020 showed LVEF 55-60%, G1DD. She was started on amlodipine and coreg.   Last seen 01/19/21 and bp was high since she stopped her medications a year ago, amlodipine and coreg. She is wanting to start a weight loss medication, but was told bp needs to be better controlled before she can start weight los medication. Coreg was restarted.   Today, the patient reports bps are still high, heart rate is down but blood pressures are the same. She did not bring her log, but says bps are 160/108 after meds. Today she did not have medications. She quit smoking. No other changes since the last visit. Discussed other things regarding weight loss, recommend PCP follow-up for this. She has made diet changes, not exercising as she should, this was encouraged.     Past Medical History:  Diagnosis Date   Collapsed lung 2004   Drug addiction in remission [facility])    detox. of herion   Hepatitis C    Hypertension    Pneumonia     Past Surgical History:  Procedure Laterality Date   ASPIRATION BIOPSY     in ICU for aspiration of vomit for 7 weeks   LIVER BIOPSY  2009   WISDOM TOOTH EXTRACTION      Current Medications: Current Meds  Medication Sig   amLODipine (NORVASC) 5 MG tablet Take 1 tablet (5 mg total) by mouth daily.     Allergies:   Sulfa antibiotics, Other, and Sulfasalazine   Social  History   Socioeconomic History   Marital status: Married    Spouse name: Not on file   Number of children: Not on file   Years of education: Not on file   Highest education level: Not on file  Occupational History   Not on file  Tobacco Use   Smoking status: Former    Packs/day: 0.50    Years: 20.00    Pack years: 10.00    Types: Cigarettes    Quit date: 01/15/2020    Years since quitting: 1.0   Smokeless tobacco: Never  Vaping Use   Vaping Use: Never used  Substance and Sexual Activity   Alcohol use: Yes    Alcohol/week: 6.0 standard drinks    Types: 6 Cans of beer per week    Comment: once a month   Drug use: Not Currently    Comment: Heroin - stopped 2004   Sexual activity: Yes    Partners: Male    Birth control/protection: I.U.D.  Other Topics Concern   Not on file  Social History Narrative   Not on file   Social Determinants of Health   Financial Resource Strain: Not on file  Food Insecurity: Not on file  Transportation Needs: Not on file  Physical Activity: Not on file  Stress: Not on file  Social Connections: Not on file     Family History: The patient's family history  includes Diabetes in her mother. There is no history of Other. She was adopted.  ROS:   Please see the history of present illness.     All other systems reviewed and are negative.  EKGs/Labs/Other Studies Reviewed:    The following studies were reviewed today:  Echo 02/2020  1. Left ventricular ejection fraction, by estimation, is 55 to 60%. The  left ventricle has normal function. Left ventricular endocardial border  not optimally defined to evaluate regional wall motion. Left ventricular  diastolic parameters are consistent  with Grade I diastolic dysfunction (impaired relaxation). The average left  ventricular global longitudinal strain is -11.8 %. The global longitudinal  strain is abnormal.   2. Right ventricular systolic function is normal. The right ventricular  size is  normal. Tricuspid regurgitation signal is inadequate for assessing  PA pressure.   3. The mitral valve is grossly normal. No evidence of mitral valve  regurgitation. No evidence of mitral stenosis.   4. The aortic valve was not well visualized. Aortic valve regurgitation  is not visualized. No aortic stenosis is present.   5. The inferior vena cava is normal in size with greater than 50%  respiratory variability, suggesting right atrial pressure of 3 mmHg.   EKG:  EKG is  ordered today.  The ekg ordered today demonstrates NSR, 87bpm, no ST/T wave changes  Recent Labs: 02/24/2020: B Natriuretic Peptide 22.4; TSH 1.364 07/07/2020: ALT 13; BUN 8; Creatinine, Ser 0.64; Hemoglobin 14.8; Platelets 291; Potassium 3.2; Sodium 135  Recent Lipid Panel    Component Value Date/Time   CHOL 161 10/23/2018 1014   TRIG 122.0 10/23/2018 1014   HDL 57.70 10/23/2018 1014   CHOLHDL 3 10/23/2018 1014   VLDL 24.4 10/23/2018 1014   LDLCALC 79 10/23/2018 1014   LDLDIRECT 84.0 05/14/2017 1556     Physical Exam:    VS:  BP (!) 140/108 (BP Location: Left Arm, Patient Position: Sitting, Cuff Size: Normal)   Pulse 87   Ht 5\' 7"  (1.702 m)   Wt 270 lb 8 oz (122.7 kg)   SpO2 98%   BMI 42.37 kg/m     Wt Readings from Last 3 Encounters:  02/03/21 270 lb 8 oz (122.7 kg)  01/19/21 269 lb 8 oz (122.2 kg)  07/07/20 230 lb (104.3 kg)     GEN:  Well nourished, well developed in no acute distress HEENT: Normal NECK: No JVD; No carotid bruits LYMPHATICS: No lymphadenopathy CARDIAC: RRR, no murmurs, rubs, gallops RESPIRATORY:  Clear to auscultation without rales, wheezing or rhonchi  ABDOMEN: Soft, non-tender, non-distended MUSCULOSKELETAL:  No edema; No deformity  SKIN: Warm and dry NEUROLOGIC:  Alert and oriented x 3 PSYCHIATRIC:  Normal affect   ASSESSMENT:    1. Sinus tachycardia   2. Essential hypertension   3. Class 3 severe obesity due to excess calories without serious comorbidity with body  mass index (BMI) of 40.0 to 44.9 in adult [facility])    PLAN:    In order of problems listed above:  HTN Patient reports blood pressures are still high on coreg, heart rate is down. Didn't bring her bp log today. BP today 140/108, she didn't have medication this morning. She stopped smoking and she was congratulated for this. Also trying to lose weight. We will start amlodipine 5mg  daily and she will call with blood pressures in 2 weeks. We will see her back in a month to reevaluate bps.   Tachycardia HR in the 80s on Coreg, no changes  at this time.   Obesity Patient wanting to lose weight, start phentermine, but BP needs to be under better control. Other options discussed, recommend having discussion with PCP. She has made diet changes, but not doing much formal exercise.   Disposition: Follow up in 1 month(s) with MD/APP    Signed, Kathrine Rieves David Stall, PA-C  02/03/2021 8:49 AM    [facility]

## 2021-02-03 ENCOUNTER — Encounter: Payer: Self-pay | Admitting: Medical

## 2021-02-03 ENCOUNTER — Ambulatory Visit (INDEPENDENT_AMBULATORY_CARE_PROVIDER_SITE_OTHER): Payer: 59 | Admitting: Medical

## 2021-02-03 ENCOUNTER — Other Ambulatory Visit: Payer: Self-pay

## 2021-02-03 VITALS — BP 140/108 | HR 87 | Ht 67.0 in | Wt 270.5 lb

## 2021-02-03 DIAGNOSIS — R000 Tachycardia, unspecified: Secondary | ICD-10-CM | POA: Diagnosis not present

## 2021-02-03 DIAGNOSIS — I10 Essential (primary) hypertension: Secondary | ICD-10-CM

## 2021-02-03 DIAGNOSIS — Z6841 Body Mass Index (BMI) 40.0 and over, adult: Secondary | ICD-10-CM | POA: Diagnosis not present

## 2021-02-03 MED ORDER — AMLODIPINE BESYLATE 5 MG PO TABS: 5.0000 mg | ORAL_TABLET | Freq: Every day | ORAL | 3 refills | Status: DC

## 2021-02-03 NOTE — Patient Instructions (Signed)
Medication Instructions:  Your physician has recommended you make the following change in your medication:   START Amlodipine 5 mg once a day  *If you need a refill on your cardiac medications before your next appointment, please call your pharmacy*   Lab Work: None  If you have labs (blood work) drawn today and your tests are completely normal, you will receive your results only by: MyChart Message (if you have MyChart) OR A paper copy in the mail If you have any lab test that is abnormal or we need to change your treatment, we will call you to review the results.   Testing/Procedures: None   Follow-Up: At [facility], you and your health needs are our priority.  As part of our continuing mission to provide you with exceptional heart care, we have created designated Provider Care Teams.  These Care Teams include your primary Cardiologist (physician) and Advanced Practice Providers (APPs -  Physician Assistants and Nurse Practitioners) who all work together to provide you with the care you need, when you need it.    Your next appointment:   1 month(s)  The format for your next appointment:   In Person  Provider:   You may see Yvonne Kendall, MD or one of the following Advanced Practice Providers on your designated Care Team:   Nicolasa Ducking, NP Eula Listen, PA-C Cadence Fransico Michael, [place]

## 2021-03-01 MED ORDER — AMLODIPINE BESYLATE 10 MG PO TABS: 10.0000 mg | ORAL_TABLET | Freq: Every day | ORAL | 0 refills | Status: DC

## 2021-03-07 ENCOUNTER — Ambulatory Visit: Payer: 59 | Admitting: Medical

## 2021-03-07 NOTE — Progress Notes (Deleted)
Cardiology Office Note:    Date:  03/07/2021   ID:  [name], DOB [birth year], MRN [id number]  PCP:  Nira Retort  [facility] Cardiologist:  Yvonne Kendall, MD  [facility] Electrophysiologist:  None   Referring MD: Nira Retort   Chief Complaint: 1 month follow-up  History of Present Illness:    Loryn Haacke is a 40 y.o. female with a hx of sinus tachycardia, hypertension, hepatitis C, pneumothorax, drug addiction in remission, COVID-19 infection (10/2019) presents today for follow-up who presents for follow-up.    Seen initially in 2021 for HTN and tachycardia. Echo 02/2020 showed LVEF 55-60%, G1DD. She was started on amlodipine and coreg.   Last seen 01/19/21 and reported she had stopped all her medications a year ago. She was planning on bariatric surgery but needing BP to bee better controlled.   Today,    Past Medical History:  Diagnosis Date   Collapsed lung 2004   Drug addiction in remission (HCC)    detox. of herion   Hepatitis C    Hypertension    Pneumonia     Past Surgical History:  Procedure Laterality Date   ASPIRATION BIOPSY     in ICU for aspiration of vomit for 7 weeks   LIVER BIOPSY  2009   WISDOM TOOTH EXTRACTION      Current Medications: No outpatient medications have been marked as taking for the 03/07/21 encounter (Appointment) with Fransico Michael, Lanice Folden H, PA-C.     Allergies:   Sulfa antibiotics, Other, and Sulfasalazine   Social History   Socioeconomic History   Marital status: Married    Spouse name: Not on file   Number of children: Not on file   Years of education: Not on file   Highest education level: Not on file  Occupational History   Not on file  Tobacco Use   Smoking status: Former    Packs/day: 0.50    Years: 20.00    Pack years: 10.00    Types: Cigarettes    Quit date: 01/15/2020    Years since quitting: 1.1   Smokeless tobacco: Never  Vaping Use   Vaping Use: Never used  Substance and Sexual  Activity   Alcohol use: Yes    Alcohol/week: 6.0 standard drinks    Types: 6 Cans of beer per week    Comment: once a month   Drug use: Not Currently    Comment: Heroin - stopped 2004   Sexual activity: Yes    Partners: Male    Birth control/protection: I.U.D.  Other Topics Concern   Not on file  Social History Narrative   Not on file   Social Determinants of Health   Financial Resource Strain: Not on file  Food Insecurity: Not on file  Transportation Needs: Not on file  Physical Activity: Not on file  Stress: Not on file  Social Connections: Not on file     Family History: The patient's family history includes Diabetes in her mother. There is no history of Other. She was adopted.  ROS:   Please see the history of present illness.     All other systems reviewed and are negative.  EKGs/Labs/Other Studies Reviewed:    The following studies were reviewed today:  Echo 02/2020  1. Left ventricular ejection fraction, by estimation, is 55 to 60%. The  left ventricle has normal function. Left ventricular endocardial border  not optimally defined to evaluate regional wall motion. Left ventricular  diastolic parameters are consistent  with  Grade I diastolic dysfunction (impaired relaxation). The average left  ventricular global longitudinal strain is -11.8 %. The global longitudinal  strain is abnormal.   2. Right ventricular systolic function is normal. The right ventricular  size is normal. Tricuspid regurgitation signal is inadequate for assessing  PA pressure.   3. The mitral valve is grossly normal. No evidence of mitral valve  regurgitation. No evidence of mitral stenosis.   4. The aortic valve was not well visualized. Aortic valve regurgitation  is not visualized. No aortic stenosis is present.   5. The inferior vena cava is normal in size with greater than 50%  respiratory variability, suggesting right atrial pressure of 3 mmHg.   EKG:  EKG is *** ordered today.   The ekg ordered today demonstrates ***  Recent Labs: 07/07/2020: ALT 13; BUN 8; Creatinine, Ser 0.64; Hemoglobin 14.8; Platelets 291; Potassium 3.2; Sodium 135  Recent Lipid Panel    Component Value Date/Time   CHOL 161 10/23/2018 1014   TRIG 122.0 10/23/2018 1014   HDL 57.70 10/23/2018 1014   CHOLHDL 3 10/23/2018 1014   VLDL 24.4 10/23/2018 1014   LDLCALC 79 10/23/2018 1014   LDLDIRECT 84.0 05/14/2017 1556     Risk Assessment/Calculations:   {Does this patient have ATRIAL FIBRILLATION?:[phone]}   Physical Exam:    VS:  There were no vitals taken for this visit.    Wt Readings from Last 3 Encounters:  02/03/21 270 lb 8 oz (122.7 kg)  01/19/21 269 lb 8 oz (122.2 kg)  07/07/20 230 lb (104.3 kg)     GEN: *** Well nourished, well developed in no acute distress HEENT: Normal NECK: No JVD; No carotid bruits LYMPHATICS: No lymphadenopathy CARDIAC: ***RRR, no murmurs, rubs, gallops RESPIRATORY:  Clear to auscultation without rales, wheezing or rhonchi  ABDOMEN: Soft, non-tender, non-distended MUSCULOSKELETAL:  No edema; No deformity  SKIN: Warm and dry NEUROLOGIC:  Alert and oriented x 3 PSYCHIATRIC:  Normal affect   ASSESSMENT:    No diagnosis found. PLAN:    In order of problems listed above:  HTN  Tachycardia    Disposition: Follow up {follow up:15908} with ***   Shared Decision Making/Informed Consent   {Are you ordering a CV Procedure (e.g. stress test, cath, DCCV, TEE, etc)?   Press F2        :[id number]}    Signed, Benson Porcaro David Stall, PA-C  03/07/2021 7:29 AM    [facility]

## 2021-03-08 ENCOUNTER — Encounter: Payer: Self-pay | Admitting: Medical

## 2021-03-19 IMAGING — DX CHEST - 2 VIEW
2 series · 2 of 2 positions shown · non-contrast
Comparison: October 29, 2017

CLINICAL DATA: Shortness of breath and chest pain

EXAM:
CHEST - 2 VIEW

[chest pa]
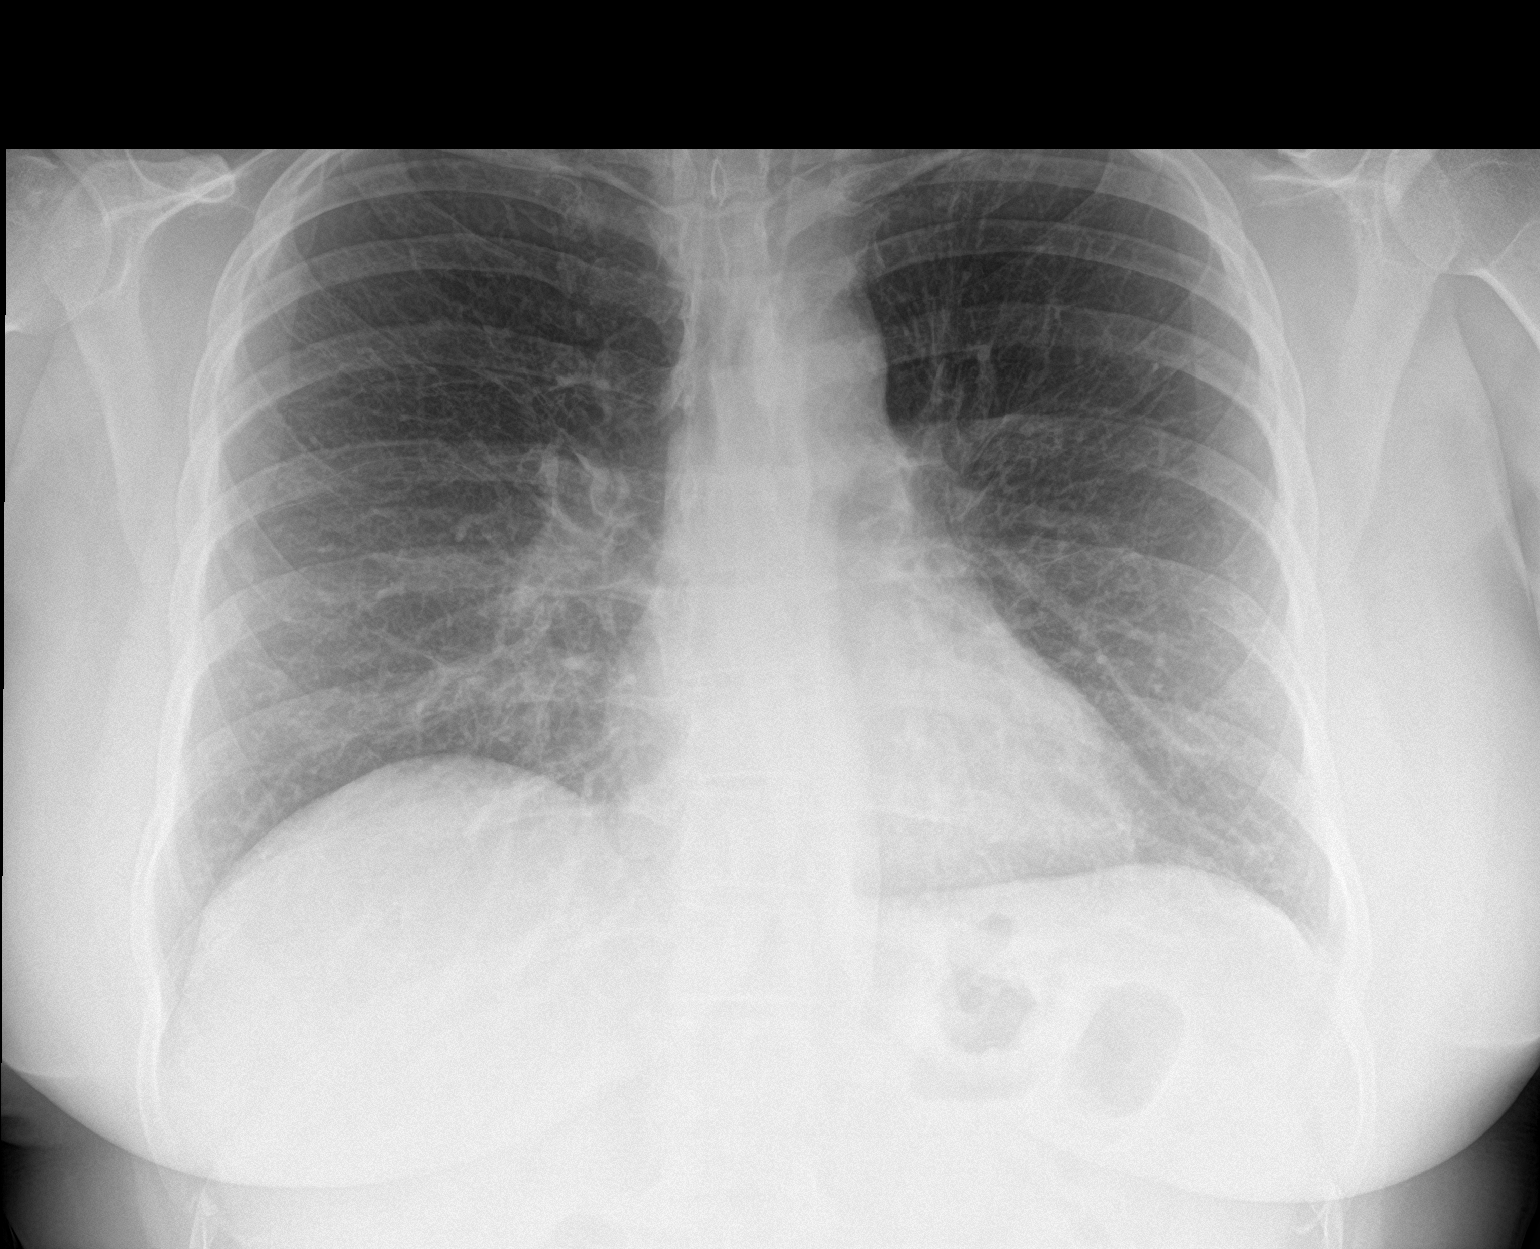

[chest lat]
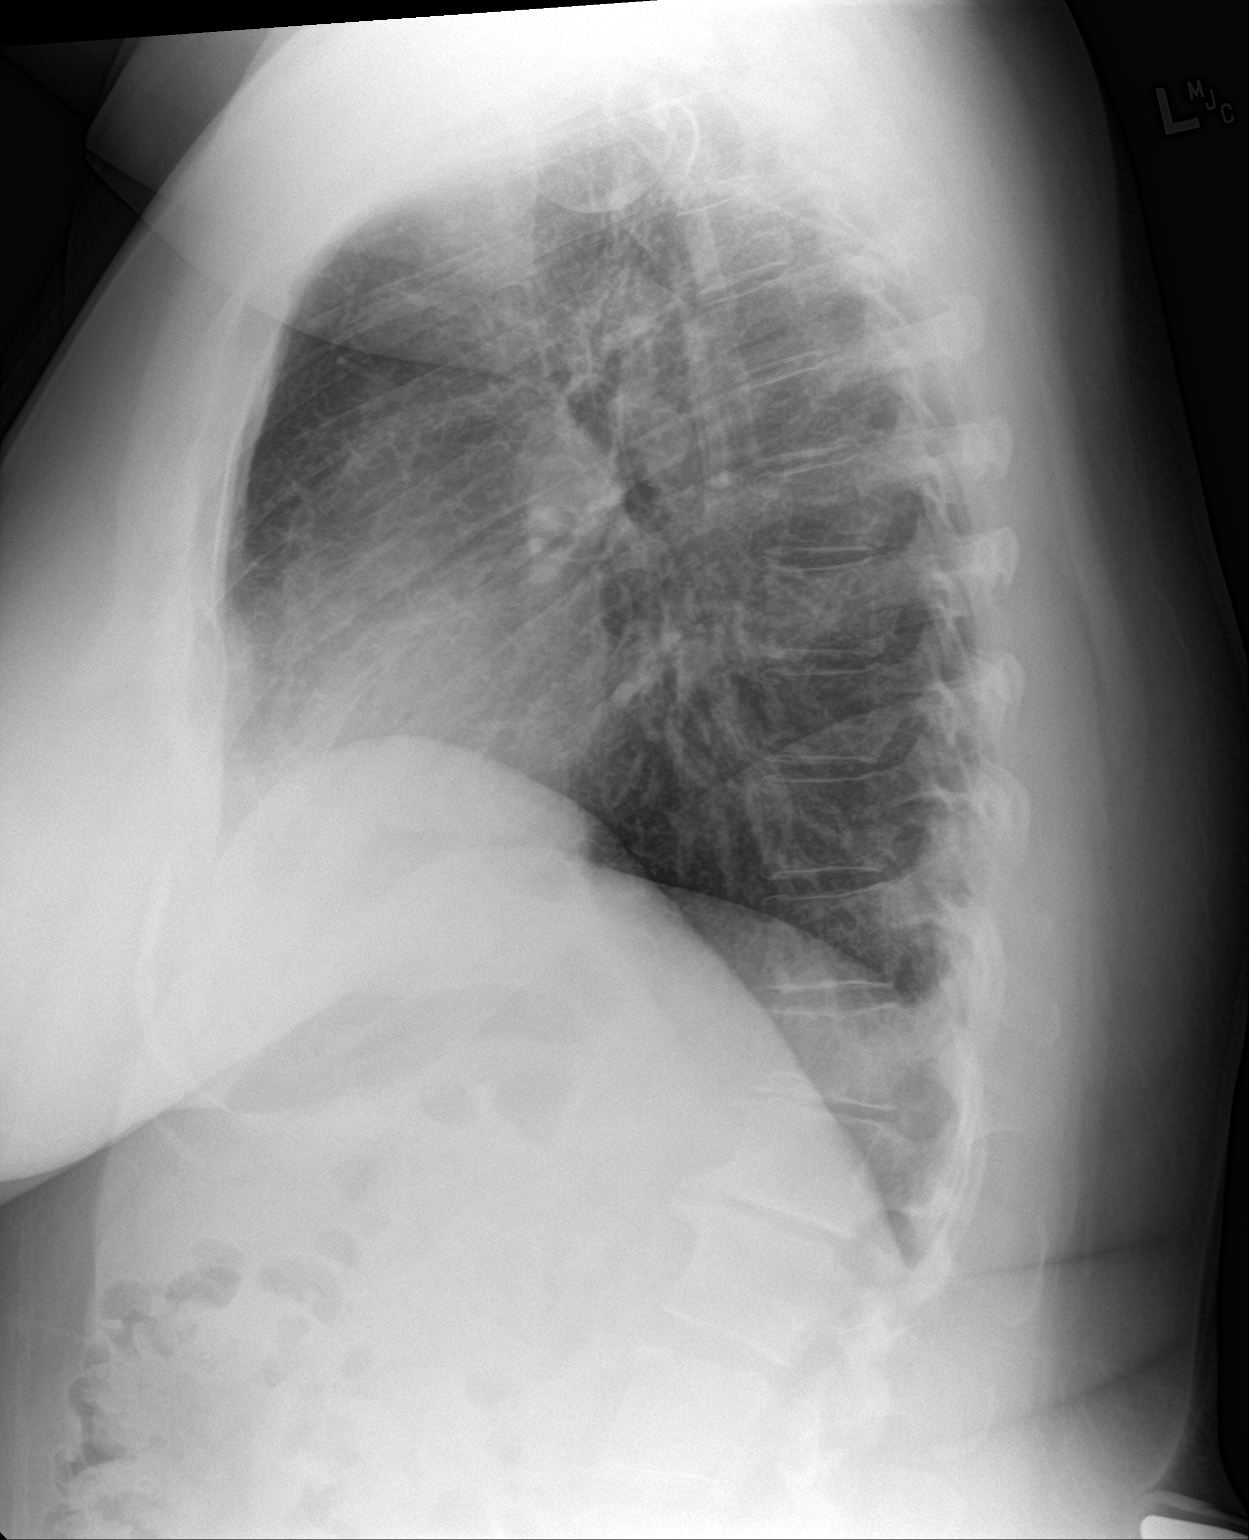

[2 of 2 positions shown; findings below may reference images not displayed]

FINDINGS: No edema or consolidation. The heart size and pulmonary vascularity
are normal. No adenopathy. No bone lesions. No pneumothorax.
IMPRESSION: No edema or consolidation.

## 2021-03-28 ENCOUNTER — Telehealth: Payer: Self-pay | Admitting: Medical

## 2021-03-28 NOTE — Telephone Encounter (Signed)
Spoke with Thayer Ohm. Verified that amlodipine is to be 10 mg daily.

## 2021-03-28 NOTE — Telephone Encounter (Signed)
Pharmacy calling for Medication dose of Amlodipine should it be 5mg  or 10mg ?? Please assist

## 2021-03-29 ENCOUNTER — Telehealth: Payer: Self-pay | Admitting: Internal Medicine

## 2021-03-29 MED ORDER — CARVEDILOL 6.25 MG PO TABS: 6.2500 mg | ORAL_TABLET | Freq: Two times a day (BID) | ORAL | 3 refills | Status: DC

## 2021-03-29 MED ORDER — AMLODIPINE BESYLATE 10 MG PO TABS: 10.0000 mg | ORAL_TABLET | Freq: Every day | ORAL | 3 refills | Status: DC

## 2021-03-29 NOTE — Telephone Encounter (Signed)
°*  STAT* If patient is at the pharmacy, call can be transferred to refill team.   1. Which medications need to be refilled? (please list name of each medication and dose if known) Amlodpine 10mg  1 tablet daily, Carvedilol 6.25mg  1 tablet twice a day  2. Which pharmacy/location (including street and city if local pharmacy) is medication to be sent to? [facility]  3. Do they need a 30 day or 90 day supply? 90 day

## 2021-05-03 ENCOUNTER — Ambulatory Visit: Payer: 59 | Admitting: Medical

## 2021-06-09 ENCOUNTER — Ambulatory Visit (INDEPENDENT_AMBULATORY_CARE_PROVIDER_SITE_OTHER): Payer: 59 | Admitting: Medical

## 2021-06-09 ENCOUNTER — Other Ambulatory Visit: Payer: Self-pay

## 2021-06-09 ENCOUNTER — Encounter: Payer: Self-pay | Admitting: Medical

## 2021-06-09 VITALS — BP 128/80 | HR 77 | Ht 67.0 in | Wt 274.2 lb

## 2021-06-09 DIAGNOSIS — R000 Tachycardia, unspecified: Secondary | ICD-10-CM | POA: Diagnosis not present

## 2021-06-09 DIAGNOSIS — Z6841 Body Mass Index (BMI) 40.0 and over, adult: Secondary | ICD-10-CM

## 2021-06-09 DIAGNOSIS — I10 Essential (primary) hypertension: Secondary | ICD-10-CM

## 2021-06-09 NOTE — Patient Instructions (Signed)
Medication Instructions:  ? ?Your physician recommends that you continue on your current medications as directed. Please refer to the Current Medication list given to you today.  ? ?*If you need a refill on your cardiac medications before your next appointment, please call your pharmacy* ? ? ?Lab Work: ?None ordered ? ?If you have labs (blood work) drawn today and your tests are completely normal, you will receive your results only by: ?MyChart Message (if you have MyChart) OR ?A paper copy in the mail ?If you have any lab test that is abnormal or we need to change your treatment, we will call you to review the results. ? ? ?Testing/Procedures: ?None ordered ? ? ?Follow-Up: ?At [facility], you and your health needs are our priority.  As part of our continuing mission to provide you with exceptional heart care, we have created designated Provider Care Teams.  These Care Teams include your primary Cardiologist (physician) and Advanced Practice Providers (APPs -  Physician Assistants and Nurse Practitioners) who all work together to provide you with the care you need, when you need it. ? ?We recommend signing up for the patient portal called "MyChart".  Sign up information is provided on this After Visit Summary.  MyChart is used to connect with patients for Virtual Visits (Telemedicine).  Patients are able to view lab/test results, encounter notes, upcoming appointments, etc.  Non-urgent messages can be sent to your provider as well.   ?To learn more about what you can do with MyChart, go to [email or web address].   ? ?Your next appointment:   ? ?Your physician wants you to follow-up in: 1 year. ? ?You will receive a reminder letter in the mail two months in advance. If you don't receive a letter, please call our office to schedule the follow-up appointment.  ? ?The format for your next appointment:   ?In Person ? ?Provider:   ?You may see Christopher End, MD or one of the following Advanced Practice Providers  on your designated Care Team:   ?Christopher Berge, NP ?Ryan Dunn, PA-C ?Cadence Furth, PA-C ? ? ?Other Instructions ?N/A ?

## 2021-06-09 NOTE — Progress Notes (Signed)
?Cardiology Office Note:   ? ?Date:  06/09/2021  ? ?ID:  Krystal Zamora, DOB Jul 30, 1980, MRN [id number] ? ?PCP:  Nira Retort  ?[facility] Cardiologist:  Yvonne Kendall, MD  ?[facility] Electrophysiologist:  None  ? ?Referring MD: Nira Retort  ? ?Chief Complaint: 1 month follow-up ? ?History of Present Illness:   ? ?Krystal Zamora is a 41 y.o. female with a hx of sinus tachycardia, hypertension, hepatitis C, pneumothorax, prior tobacco use, drug addiction in remission, COVID-19 infection (10/2019) presents today for follow-up who presents for follow-up.  ?  ?Seen initially in 2021 for HTN and tachycardia. Echo 02/2020 showed LVEF 55-60%, G1DD. She was started on amlodipine and coreg. ?  ?Seen 01/19/21 and bp was high since she stopped her medications a year ago, amlodipine and coreg. She is wanting to start a weight loss medication, but was told bp needs to be better controlled before she can start weight loss medication. Coreg was restarted.  ? ?Last seen 02/03/21 and bp was still high. Amlodipine was started. She quit smoking.  ? ?Today, blood pressure is good, 128/80. Overall the patient reports she is not feeling too great, she has had a hard time losing weight and feels very emotional at times. She has done 2 months of diet and exercise changes but has not seen any results. She also stopped smoking and ETOH use. This is very disheartening to her. She wonders if her hormones are off. She is has non-hormonal IUD. Recommended PCP or OBGYN follow-up.  She denies chest pain or shortness of breath. No LLE, orthopnea or pnd. She has been taking thrive vitamins daily.  ? ? ?Past Medical History:  ?Diagnosis Date  ? Collapsed lung 2004  ? Drug addiction in remission [facility])   ? detox. of herion  ? Hepatitis C   ? Hypertension   ? Pneumonia   ? ? ?Past Surgical History:  ?Procedure Laterality Date  ? ASPIRATION BIOPSY    ? in ICU for aspiration of vomit for 7 weeks  ? LIVER BIOPSY  2009  ? WISDOM TOOTH  EXTRACTION    ? ? ?Current Medications: ?Current Meds  ?Medication Sig  ? amLODipine (NORVASC) 10 MG tablet Take 1 tablet (10 mg total) by mouth daily.  ? carvedilol (COREG) 6.25 MG tablet Take 1 tablet (6.25 mg total) by mouth 2 (two) times daily.  ?  ? ?Allergies:   Sulfa antibiotics, Other, and Sulfasalazine  ? ?Social History  ? ?Socioeconomic History  ? Marital status: Married  ?  Spouse name: Not on file  ? Number of children: Not on file  ? Years of education: Not on file  ? Highest education level: Not on file  ?Occupational History  ? Not on file  ?Tobacco Use  ? Smoking status: Former  ?  Packs/day: 0.50  ?  Years: 20.00  ?  Pack years: 10.00  ?  Types: Cigarettes  ?  Quit date: 01/15/2020  ?  Years since quitting: 1.4  ? Smokeless tobacco: Never  ?Vaping Use  ? Vaping Use: Never used  ?Substance and Sexual Activity  ? Alcohol use: Yes  ?  Alcohol/week: 6.0 standard drinks  ?  Types: 6 Cans of beer per week  ?  Comment: once a month  ? Drug use: Not Currently  ?  Comment: Heroin - stopped 2004  ? Sexual activity: Yes  ?  Partners: Male  ?  Birth control/protection: I.U.D.  ?Other Topics Concern  ? Not on file  ?  Social History Narrative  ? Not on file  ? ?Social Determinants of Health  ? ?Financial Resource Strain: Not on file  ?Food Insecurity: Not on file  ?Transportation Needs: Not on file  ?Physical Activity: Not on file  ?Stress: Not on file  ?Social Connections: Not on file  ?  ? ?Family History: ?The patient's family history includes Diabetes in her mother. There is no history of Other. She was adopted. ? ?ROS:   ?Please see the history of present illness.    ? All other systems reviewed and are negative. ? ?EKGs/Labs/Other Studies Reviewed:   ? ?The following studies were reviewed today: ? ?Echo 02/2020 ? 1. Left ventricular ejection fraction, by estimation, is 55 to 60%. The  ?left ventricle has normal function. Left ventricular endocardial border  ?not optimally defined to evaluate regional wall  motion. Left ventricular  ?diastolic parameters are consistent  ?with Grade I diastolic dysfunction (impaired relaxation). The average left  ?ventricular global longitudinal strain is -11.8 %. The global longitudinal  ?strain is abnormal.  ? 2. Right ventricular systolic function is normal. The right ventricular  ?size is normal. Tricuspid regurgitation signal is inadequate for assessing  ?PA pressure.  ? 3. The mitral valve is grossly normal. No evidence of mitral valve  ?regurgitation. No evidence of mitral stenosis.  ? 4. The aortic valve was not well visualized. Aortic valve regurgitation  ?is not visualized. No aortic stenosis is present.  ? 5. The inferior vena cava is normal in size with greater than 50%  ?respiratory variability, suggesting right atrial pressure of 3 mmHg.  ? ?EKG:  EKG is  ordered today.  The ekg ordered today demonstrates NSR, 77bpm, nonspecific T wave changes III ? ?Recent Labs: ?07/07/2020: ALT 13; BUN 8; Creatinine, Ser 0.64; Hemoglobin 14.8; Platelets 291; Potassium 3.2; Sodium 135  ?Recent Lipid Panel ?   ?Component Value Date/Time  ? CHOL 161 10/23/2018 1014  ? TRIG 122.0 10/23/2018 1014  ? HDL 57.70 10/23/2018 1014  ? CHOLHDL 3 10/23/2018 1014  ? VLDL 24.4 10/23/2018 1014  ? LDLCALC 79 10/23/2018 1014  ? LDLDIRECT 84.0 05/14/2017 1556  ? ? ?Physical Exam:   ? ?VS:  BP 128/80 (BP Location: Left Arm, Patient Position: Sitting, Cuff Size: Normal)   Pulse 77   Ht 5\' 7"  (1.702 m)   Wt 274 lb 4 oz (124.4 kg)   SpO2 98%   BMI 42.95 kg/m?    ? ?Wt Readings from Last 3 Encounters:  ?06/09/21 274 lb 4 oz (124.4 kg)  ?02/03/21 270 lb 8 oz (122.7 kg)  ?01/19/21 269 lb 8 oz (122.2 kg)  ?  ? ?GEN:  Well nourished, well developed in no acute distress ?HEENT: Normal ?NECK: No JVD; No carotid bruits ?LYMPHATICS: No lymphadenopathy ?CARDIAC: RRR, no murmurs, rubs, gallops ?RESPIRATORY:  Clear to auscultation without rales, wheezing or rhonchi  ?ABDOMEN: Soft, non-tender,  non-distended ?MUSCULOSKELETAL:  No edema; No deformity  ?SKIN: Warm and dry ?NEUROLOGIC:  Alert and oriented x 3 ?PSYCHIATRIC:  Normal affect  ? ?ASSESSMENT:   ? ?1. Essential hypertension   ?2. Sinus tachycardia   ?3. Class 3 severe obesity due to excess calories without serious comorbidity with body mass index (BMI) of 40.0 to 44.9 in adult [facility](HCC)   ? ?PLAN:   ? ?In order of problems listed above: ? ?HTN ?BP better on amlodipine 5mg  daily. She is working on weight loss with diet and exercise, however not having much progress. May need to see  PCP for further discussion around this. Continue Coreg and amlodipine.  ? ?Tachycardia ?Resolved, heart rate 77bpm by EKG today. Continue Coreg.   ? ?Obesity ?Patient is trying to lose weight as above. She was encouraged to keep going with lifestyle changes despite minimal progress.  ? ?Disposition: Follow up in 1 year(s) with MD/APP  ? ? ? ?Signed, ?Jaira Canady David Stall, PA-C  ?06/09/2021 10:49 AM    [facility]  ?

## 2021-11-30 ENCOUNTER — Ambulatory Visit (INDEPENDENT_AMBULATORY_CARE_PROVIDER_SITE_OTHER): Payer: 59

## 2021-11-30 ENCOUNTER — Ambulatory Visit (INDEPENDENT_AMBULATORY_CARE_PROVIDER_SITE_OTHER): Payer: 59 | Admitting: Podiatry

## 2021-11-30 ENCOUNTER — Telehealth: Payer: Self-pay | Admitting: Podiatry

## 2021-11-30 DIAGNOSIS — M722 Plantar fascial fibromatosis: Secondary | ICD-10-CM | POA: Diagnosis not present

## 2021-11-30 MED ORDER — MELOXICAM 15 MG PO TABS: 15.0000 mg | ORAL_TABLET | Freq: Every day | ORAL | 0 refills | Status: AC

## 2021-11-30 MED ORDER — MELOXICAM 15 MG PO TABS: 15.0000 mg | ORAL_TABLET | Freq: Every day | ORAL | 0 refills | Status: DC

## 2021-11-30 MED ORDER — METHYLPREDNISOLONE 4 MG PO TBPK: ORAL_TABLET | ORAL | 0 refills | Status: AC

## 2021-11-30 NOTE — Telephone Encounter (Signed)
Pt called about medication to be sent to pharmacy and states its not there as of yet. States that meloxicam and a steroid would be sent to pharmacy today.  [facility] [place]  - [address]  Please advise.

## 2021-11-30 NOTE — Patient Instructions (Signed)

## 2021-11-30 NOTE — Addendum Note (Signed)
Addended by: Nicholes Rough on: 11/30/2021 04:17 PM   Modules accepted: Orders

## 2021-12-08 NOTE — Progress Notes (Signed)
Subjective:  Patient ID: Krystal Zamora, female    DOB: 10-04-1980,  MRN: [id number]  No chief complaint on file.   41 y.o. female presents with the above complaint.  Patient presents with heel pain to the left side.  Patient states pain for touch is progressive gotten worse pain scale 7 out of 10 hurts with ambulation sharp shooting in nature she has not seen anyone else prior to seeing me denies any other acute complaints.  He would like to discuss treatment options   Review of Systems: Negative except as noted in the HPI. Denies N/V/F/Ch.  Past Medical History:  Diagnosis Date   Collapsed lung 2004   Drug addiction in remission [facility])    detox. of herion   Hepatitis C    Hypertension    Pneumonia     Current Outpatient Medications:    meloxicam (MOBIC) 15 MG tablet, Take 1 tablet (15 mg total) by mouth daily., Disp: 30 tablet, Rfl: 0   amLODipine (NORVASC) 10 MG tablet, Take 1 tablet (10 mg total) by mouth daily., Disp: 90 tablet, Rfl: 3   carvedilol (COREG) 6.25 MG tablet, Take 1 tablet (6.25 mg total) by mouth 2 (two) times daily., Disp: 180 tablet, Rfl: 3   meloxicam (MOBIC) 15 MG tablet, Take 1 tablet (15 mg total) by mouth daily., Disp: 30 tablet, Rfl: 0   methylPREDNISolone (MEDROL DOSEPAK) 4 MG TBPK tablet, Take as directed, Disp: 21 each, Rfl: 0  Social History   Tobacco Use  Smoking Status Former   Packs/day: 0.50   Years: 20.00   Total pack years: 10.00   Types: Cigarettes   Quit date: 01/15/2020   Years since quitting: 1.8  Smokeless Tobacco Never    Allergies  Allergen Reactions   Sulfa Antibiotics Hives   Other Other (See Comments)   Sulfasalazine Hives   Objective:  There were no vitals filed for this visit. There is no height or weight on file to calculate BMI. Constitutional Well developed. Well nourished.  Vascular Dorsalis pedis pulses palpable bilaterally. Posterior tibial pulses palpable bilaterally. Capillary refill normal to all digits.   No cyanosis or clubbing noted. Pedal hair growth normal.  Neurologic Normal speech. Oriented to person, place, and time. Epicritic sensation to light touch grossly present bilaterally.  Dermatologic Nails well groomed and normal in appearance. No open wounds. No skin lesions.  Orthopedic: Normal joint ROM without pain or crepitus bilaterally. No visible deformities. Tender to palpation at the calcaneal tuber left. No pain with calcaneal squeeze left. Ankle ROM diminished range of motion left. Silfverskiold Test: positive left.   Radiographs: Taken and reviewed. No acute fractures or dislocations. No evidence of stress fracture.  Plantar heel spur present. Posterior heel spur absent.  Pes planovalgus foot structure noted  Assessment:   1. Plantar fasciitis of left foot    Plan:  Patient was evaluated and treated and all questions answered.  Plantar Fasciitis, left - XR reviewed as above.  - Educated on icing and stretching. Instructions given.  - Injection delivered to the plantar fascia as below. - DME: Plantar fascial brace dispensed to support the medial longitudinal arch of the foot and offload pressure from the heel and prevent arch collapse during weightbearing - Pharmacologic management: None  Procedure: Injection Tendon/Ligament Location: Left plantar fascia at the glabrous junction; medial approach. Skin Prep: alcohol Injectate: 0.5 cc 0.5% marcaine plain, 0.5 cc of 1% Lidocaine, 0.5 cc kenalog 10. Disposition: Patient tolerated procedure well. Injection site dressed with a  band-aid.  No follow-ups on file.

## 2021-12-28 ENCOUNTER — Ambulatory Visit (INDEPENDENT_AMBULATORY_CARE_PROVIDER_SITE_OTHER): Payer: 59 | Admitting: Podiatry

## 2021-12-28 DIAGNOSIS — Q666 Other congenital valgus deformities of feet: Secondary | ICD-10-CM | POA: Diagnosis not present

## 2021-12-28 DIAGNOSIS — M722 Plantar fascial fibromatosis: Secondary | ICD-10-CM | POA: Diagnosis not present

## 2021-12-28 MED ORDER — MELOXICAM 15 MG PO TABS: 15.0000 mg | ORAL_TABLET | Freq: Every day | ORAL | 0 refills | Status: AC

## 2021-12-28 NOTE — Addendum Note (Signed)
Addended by: Vertell Novak on: 12/28/2021 09:16 AM   Modules accepted: Orders

## 2021-12-28 NOTE — Progress Notes (Signed)
Subjective:  Patient ID: Krystal Zamora, female    DOB: 1980/06/03,  MRN: [id number]  Chief Complaint  Patient presents with   Plantar Fasciitis    41 y.o. female presents with the above complaint.  Patient presents for follow-up of left Planter fasciitis.  She states she feels a lot better the injection definitely helped she still has some residual pain she wanted to discuss next treatment plan.  The plantar fascia brace helped as well.  Review of Systems: Negative except as noted in the HPI. Denies N/V/F/Ch.  Past Medical History:  Diagnosis Date   Collapsed lung 2004   Drug addiction in remission [facility])    detox. of herion   Hepatitis C    Hypertension    Pneumonia     Current Outpatient Medications:    amLODipine (NORVASC) 10 MG tablet, Take 1 tablet (10 mg total) by mouth daily., Disp: 90 tablet, Rfl: 3   carvedilol (COREG) 6.25 MG tablet, Take 1 tablet (6.25 mg total) by mouth 2 (two) times daily., Disp: 180 tablet, Rfl: 3   meloxicam (MOBIC) 15 MG tablet, Take 1 tablet (15 mg total) by mouth daily., Disp: 30 tablet, Rfl: 0   meloxicam (MOBIC) 15 MG tablet, Take 1 tablet (15 mg total) by mouth daily., Disp: 30 tablet, Rfl: 0   methylPREDNISolone (MEDROL DOSEPAK) 4 MG TBPK tablet, Take as directed, Disp: 21 each, Rfl: 0  Social History   Tobacco Use  Smoking Status Former   Packs/day: 0.50   Years: 20.00   Total pack years: 10.00   Types: Cigarettes   Quit date: 01/15/2020   Years since quitting: 1.9  Smokeless Tobacco Never    Allergies  Allergen Reactions   Sulfa Antibiotics Hives   Other Other (See Comments)   Sulfasalazine Hives   Objective:  There were no vitals filed for this visit. There is no height or weight on file to calculate BMI. Constitutional Well developed. Well nourished.  Vascular Dorsalis pedis pulses palpable bilaterally. Posterior tibial pulses palpable bilaterally. Capillary refill normal to all digits.  No cyanosis or clubbing  noted. Pedal hair growth normal.  Neurologic Normal speech. Oriented to person, place, and time. Epicritic sensation to light touch grossly present bilaterally.  Dermatologic Nails well groomed and normal in appearance. No open wounds. No skin lesions.  Orthopedic: Normal joint ROM without pain or crepitus bilaterally. No visible deformities. Tender to palpation at the calcaneal tuber left. No pain with calcaneal squeeze left. Ankle ROM diminished range of motion left. Silfverskiold Test: positive left.   Radiographs: Taken and reviewed. No acute fractures or dislocations. No evidence of stress fracture.  Plantar heel spur present. Posterior heel spur absent.  Pes planovalgus foot structure noted  Assessment:   1. Plantar fasciitis of left foot   2. Pes planovalgus     Plan:  Patient was evaluated and treated and all questions answered.  Plantar Fasciitis, left - XR reviewed as above.  - Educated on icing and stretching. Instructions given.  -Second injection delivered to the plantar fascia as below. - DME: Plantar fascial brace dispensed to support the medial longitudinal arch of the foot and offload pressure from the heel and prevent arch collapse during weightbearing - Pharmacologic management: None  Pes planovalgus -I explained to patient the etiology of pes planovalgus and relationship with Planter fasciitis and various treatment options were discussed.  Given patient foot structure in the setting of Planter fasciitis I believe patient will benefit from custom-made orthotics to help control  the hindfoot motion support the arch of the foot and take the stress away from plantar fascial.  Patient agrees with the plan like to proceed with orthotics -Patient was casted for orthotics    Procedure: Injection Tendon/Ligament Location: Left plantar fascia at the glabrous junction; medial approach. Skin Prep: alcohol Injectate: 0.5 cc 0.5% marcaine plain, 0.5 cc of 1% Lidocaine,  0.5 cc kenalog 10. Disposition: Patient tolerated procedure well. Injection site dressed with a band-aid.  No follow-ups on file.

## 2022-03-01 ENCOUNTER — Ambulatory Visit: Payer: Self-pay | Admitting: Podiatry

## 2022-03-08 ENCOUNTER — Ambulatory Visit (INDEPENDENT_AMBULATORY_CARE_PROVIDER_SITE_OTHER): Payer: Self-pay | Admitting: Podiatry

## 2022-03-08 DIAGNOSIS — Q666 Other congenital valgus deformities of feet: Secondary | ICD-10-CM

## 2022-03-08 DIAGNOSIS — M722 Plantar fascial fibromatosis: Secondary | ICD-10-CM

## 2022-03-21 NOTE — Progress Notes (Signed)
Orthotics were dispensed and they are functioning well.  No complication noted.

## 2022-03-28 DIAGNOSIS — M722 Plantar fascial fibromatosis: Secondary | ICD-10-CM | POA: Diagnosis not present

## 2022-04-27 ENCOUNTER — Other Ambulatory Visit: Payer: Self-pay | Admitting: Medical

## 2022-05-08 ENCOUNTER — Other Ambulatory Visit: Payer: Self-pay | Admitting: Medical

## 2022-05-09 ENCOUNTER — Other Ambulatory Visit: Payer: Self-pay

## 2022-05-09 MED ORDER — CARVEDILOL 6.25 MG PO TABS: 6.2500 mg | ORAL_TABLET | Freq: Two times a day (BID) | ORAL | 0 refills | Status: AC

## 2022-05-09 NOTE — Progress Notes (Signed)
Prescription sent to pharmacy.

## 2022-08-22 ENCOUNTER — Other Ambulatory Visit: Payer: Self-pay

## 2022-08-22 DIAGNOSIS — Z1231 Encounter for screening mammogram for malignant neoplasm of breast: Secondary | ICD-10-CM
